# Patient Record
Sex: Female | Born: 1975 | Race: Black or African American | Hispanic: No | Marital: Single | Smoking: Former smoker
Health system: Northeastern US, Academic
[De-identification: ages and names within clinical notes are randomized; demographics above are authoritative.]

## PROBLEM LIST (undated history)

## (undated) MED FILL — HYDROCHLOROTHIAZIDE 25 MG TABLET: 25 25 mg | ORAL | 30 days supply | Qty: 30 | Fill #0 | Status: CN

## (undated) MED FILL — SERTRALINE 25 MG TABLET: 25 25 mg | ORAL | 30 days supply | Qty: 30 | Fill #0 | Status: CN

## (undated) MED FILL — MOUNJARO 5 MG/0.5 ML SUBCUTANEOUS PEN INJECTOR: 5 5 mg/0.5 mL | SUBCUTANEOUS | 28 days supply | Qty: 2 | Fill #0 | Status: CN

## (undated) MED FILL — INSULIN GLARGINE (U-100) 100 UNIT/ML (3 ML) SUBCUTANEOUS PEN: 100 100 unit/mL (3 mL) | SUBCUTANEOUS | 30 days supply | Qty: 15 | Fill #0 | Status: CN

## (undated) MED FILL — ERGOCALCIFEROL (VITAMIN D2) 1,250 MCG (50,000 UNIT) CAPSULE: 1.25 1.25 MG (50000 UT) | ORAL | 28 days supply | Qty: 4 | Fill #0 | Status: CN

## (undated) MED FILL — MOUNJARO 2.5 MG/0.5 ML SUBCUTANEOUS PEN INJECTOR: 2.5 2.5 mg/0.5 mL | SUBCUTANEOUS | 28 days supply | Qty: 2 | Fill #1 | Status: CN

## (undated) MED FILL — OZEMPIC 0.25 MG OR 0.5 MG (2 MG/1.5 ML) SUBCUTANEOUS PEN INJECTOR: 0.25 0.25 mg or 0.5 mg(2 mg/1.5 mL) | SUBCUTANEOUS | 6 days supply | Qty: 1 | Fill #0 | Status: CN

## (undated) MED FILL — ATORVASTATIN 40 MG TABLET: 40 40 mg | ORAL | 30 days supply | Qty: 30 | Fill #0 | Status: CN

---

## 2015-02-16 ENCOUNTER — Ambulatory Visit: Admit: 2015-02-16 | Payer: HMO

## 2015-07-27 ENCOUNTER — Ambulatory Visit: Admit: 2015-07-27 | Payer: HMO

## 2016-02-15 ENCOUNTER — Ambulatory Visit

## 2016-02-15 NOTE — Telephone Encounter (Signed)
----   Converted from flag ----  ---- 02/14/2016 8:30 AM, Coralyn Pear wrote:  [facility]    ---- 02/13/2016 8:35 AM, [name], MD wrote:  please outreach to pt to book appt with me. J  ------------------------------      Created By [name] MD on 02/15/2016 at 12:01 PM    Electronically Signed By [name] MD on 02/15/2016 at 12:01 PM

## 2016-04-06 ENCOUNTER — Ambulatory Visit

## 2016-04-06 NOTE — Telephone Encounter (Signed)
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) (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)  HYPERTENSION (ICD-401.9) (ICD10-I10)  DEPRESSION (ICD-311) (ICD10-F32.9)  ANXIETY (ICD-300.00) (ICD10-F41.9)  ABNORMAL PAP SMEAR 12/11 LSIL; COLPO 12/11; BX CIN I; 6/12 ASCUS PAP; 4/13 PAP NEG; HR HPV POS, NEG COLPO; 5/14 PAP/HR HPV NEG (ICD-795.00) (ICD10-R87.619)  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)  PATELLAR DISLOCATION-MEDIAL RETINACULAR TEAR; RIGHT KNEE (ICD-836.3) (ICD10-S83.006)  OBESITY (ICD-278.00) (ICD10-E66.9)  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)  SEXUALLY TRANSMITTED DISEASE, EXPOSURE TO-H/O GC, TRICH, HPV, CHLAMYDIA (ICD-V01.6) (ICD10-Z20.2)  S/P CHOLECYSTECTOMY (ICD-V45.79) (ICD10-Z90.49)    Allergies:   PENICILLIN V POTASSIUM (PENICILLIN V POTASSIUM) (Critical)    Meds (prior to this call):   LANTUS 100 UNIT/ML SOLN (INSULIN GLARGINE) 47  units daily as directed  LISINOPRIL-HYDROCHLOROTHIAZIDE 20-12.5 MG TABS (LISINOPRIL-HYDROCHLOROTHIAZIDE) one tablet daily  ATORVASTATIN CALCIUM 20 MG TABS (ATORVASTATIN CALCIUM) one tablet orally daily  * ACCUCHECK GLUCOMETER check sugars three times daily Dx: Diabetes type II with retinopathy E11.319  RELION ALL-IN-ONE DEVI (BLOOD GLUC METER DISP-STRIPS) Use three times daily (whichever brand covered/with meter)  LANCETS MISC (LANCETS) Use as directed.  INSULIN SYRINGE 30G X 1/2" 0.5 ML MISC (INSULIN SYRINGE-NEEDLE U-100)   BD DISP NEEDLES 30G X 1/2" MISC (NEEDLE (DISP)) use small needle to inject insulin          Created By [name] MD on 04/06/2016 at  03:20 PM    Electronically Signed By [name] MD on 04/06/2016 at 03:20 PM

## 2016-06-06 ENCOUNTER — Ambulatory Visit

## 2016-06-06 NOTE — Telephone Encounter (Signed)
----   Converted from flag ----  ---- 06/06/2016 10:26 AM, Coralyn Pear wrote:  [facility]    ---- 06/05/2016 8:11 PM, [name], MD wrote:  outreach to pt for appt with me...  Also, is she still having shortness of breath? Need rebook stress echo?  Thanks. J  ------------------------------    Created By [name] MD on 06/06/2016 at 11:04 AM    Electronically Signed By [name] MD on 06/06/2016 at 11:04 AM

## 2016-06-30 ENCOUNTER — Ambulatory Visit

## 2016-06-30 NOTE — Telephone Encounter (Signed)
PHONE NOTE   Phone call placed to patient      Phone Call: Need to make an appointment     Call Information   *Call Reason: Diabetes    Call Action   *Spoke with Patient: LVM (Left Voice Message)     *Outcome of Call: Did not reach patient    Other Call Details     (Scripting)   Hello. I am calling you from Primary Care at [facility]. How are you today?    Your doctor wanted to schedule a follow-up appointment to review your care.    When are you available to come in?    [name]..if asked why you called, or reason for appoinment...    Your Diabetes is out of control. It is important for your health to see your team and decrease your blood sugars.    ......................................Marland KitchenCoralyn Pear  June 30, 2016 4:11 PM        Created By Coralyn Pear on 06/30/2016 at 04:11 PM    Electronically Signed By [name] MD on 07/01/2016 at 08:05 AM

## 2016-07-03 ENCOUNTER — Ambulatory Visit

## 2016-07-03 NOTE — Telephone Encounter (Signed)
PHONE NOTE   Phone call placed to patient      Phone Call: Need to make an appointment     Call Information   *Call Reason: Diabetes    Call Action   *Spoke with Patient: No (no answer; disconnected; bad tel #)     *Outcome of Call: Did not reach patient    Other Call Details   Notes: Patient already has an appointment scheduled for 08/01/16 at 11:00am.    (Scripting)   Hello. I am calling you from Primary Care at [facility]. How are you today?    Your doctor wanted to schedule a follow-up appointment to review your care.    When are you available to come in?    [name]..if asked why you called, or reason for appoinment...    Your Diabetes is out of control. It is important for your health to see your team and decrease your blood sugars.    ......................................Marland KitchenCoralyn Pear  July 03, 2016 3:52 PM        Created By Coralyn Pear on 07/03/2016 at 03:52 PM    Electronically Signed By [name] MD on 07/09/2016 at 08:29 AM

## 2016-07-07 ENCOUNTER — Ambulatory Visit

## 2016-07-21 ENCOUNTER — Ambulatory Visit

## 2016-08-15 ENCOUNTER — Ambulatory Visit

## 2016-08-15 ENCOUNTER — Ambulatory Visit: Admitting: Internal Medicine

## 2016-08-15 LAB — HX HEM-ROUTINE
HX HCT: 35.2 % (ref 32.0–45.0)
HX HGB: 11.7 g/dL (ref 11.0–15.0)
HX MCH: 30.6 pg (ref 26.0–34.0)
HX MCHC: 33.2 g/dL (ref 32.0–36.0)
HX MCV: 92.1 fL (ref 80.0–98.0)
HX MPV: 9.6 fL (ref 9.1–11.7)
HX NRBC #: 0 10*3/uL
HX NUCLEATED RBC: 0 %
HX PLT: 338 10*3/uL (ref 150–400)
HX RBC BLOOD COUNT: 3.82 M/uL (ref 3.70–5.00)
HX RDW: 15.5 % — ABNORMAL HIGH (ref 11.5–14.5)
HX WBC: 8 10*3/uL (ref 4.0–11.0)

## 2016-08-15 LAB — HX BF-CHEM/URINE
HX ALBUMIN RANDOM URINE: <0.5 mg/dL
HX CREATININE, RANDOM URINE: 116.86 mg/dL

## 2016-08-15 LAB — HX DIABETES: HX ALBUMIN RANDOM URINE: <0.5 mg/dL

## 2016-08-15 LAB — HX CHEM-PANELS
HX ANION GAP: 7 (ref 5–18)
HX BLOOD UREA NITROGEN: 15 mg/dL (ref 6–24)
HX CHLORIDE (CL): 106 meq/L (ref 98–110)
HX CO2: 25 meq/L (ref 20–30)
HX CREATININE (CR): 0.97 mg/dL (ref 0.57–1.30)
HX GFR, AFRICAN AMERICAN: 84 mL/min/{1.73_m2} — ABNORMAL LOW
HX GFR, NON-AFRICAN AMERICAN: 73 mL/min/{1.73_m2} — ABNORMAL LOW
HX POTASSIUM (K): 4.3 meq/L (ref 3.6–5.1)
HX SODIUM (NA): 138 meq/L (ref 135–145)

## 2016-08-15 LAB — HX CHEM-LFT
HX ALANINE AMINOTRANSFERASE (ALT/SGPT): 14 IU/L (ref 0–54)
HX ASPARTATE AMINOTRANFERASE (AST/SGOT): 13 IU/L (ref 10–42)

## 2016-08-15 LAB — HX CHEM-LIPIDS
HX CHOL-HDL RATIO: 4.3
HX CHOLESTEROL: 182 mg/dL (ref 110–199)
HX HIGH DENSITY LIPOPROTEIN CHOL (HDL): 42 mg/dL (ref 35–75)
HX HOURS FAST: 0 h
HX LDL: 117 mg/dL (ref 0–129)
HX TRIGLYCERIDES: 114 mg/dL (ref 40–250)

## 2016-08-15 LAB — HX CHOLESTEROL
HX CHOLESTEROL: 182 mg/dL (ref 110–199)
HX HIGH DENSITY LIPOPROTEIN CHOL (HDL): 42 mg/dL (ref 35–75)
HX LDL: 117 mg/dL (ref 0–129)
HX TRIGLYCERIDES: 114 mg/dL (ref 40–250)

## 2016-08-15 LAB — HX POINT OF CARE: HX HGB A1C, POC: 9.6 % — ABNORMAL HIGH (ref 4.3–5.8)

## 2016-08-15 NOTE — Progress Notes (Signed)
General Medicine Visit  .  Service Due by Standard Protocol Rules: DIAB EYE EX, MICROALB/CRE, LDL, HGB  A1C or HGBA1C%POC.    .  Initial Screening   * [place]: Sheikh ([name])  Ht: 64.5 in.  Wt: 211.8 lbs.   BMI: 35.92  Temp: 100.8 deg F.     BP (Initial [place] Screening): 124 / 78      BP (Rechecked, Actionable): 124 /   78 mmHg   HR: 110     Travel outside of the [place] in past 28 days:: No  Chronic Pain Assessment: Does pt experience chronic pain ? NO  Smoking Assessment: Tobacco use? current every day smoker  Smoking Counseling? YES Quitworks form was provided today  .  [name]  Med List: PRINTED by [place] for patient   Med List: PRINTED by [place] for patient   Patient consents for eRx History:  Paper  .  Falls Risk Assessment:   In the past year, have you ... Had no falls  Difficulty with balance? NO  Need assistance with ambulation while here? NO  Comments: Medication List given.  .  ......................................Marland KitchenNafisa Sheikh  August 15, 2016 1:41   PM  .  Basic Visit  Chief Complaint:   40 year old here for f/u mmp   .  History of Present Illness:   DOE   Will at times get hard to catch her breath when moving about in the house.   Maybe bending down/picking something up. That has been a while, not progre  ssing. : Last time was short of breath and didn't do stress test    "I was scared of that" I can't go on a treadmill   Still a bit stuffy; can be short of breath with exertion   "I don't think it is worse" about the same   exercise--jumping jacks here and there; squats, weights; feels that she do  es that ok   Walks-a few days, will get lunch and come back; 2 blocks;   .  DM: due for diabetic eye exam-wants to do at [facility].    Was off insulin for a few months due to finances   Now back on August or so.   Has not been checking any sugars.   no lows   currently taking 45 unitts although was supposed to be on 47 units   also hard to check sugars b/c materials are expensive  Is noticing health things "my eyes and breathing";  body feels different; sh  e was feeling heavy, like she was gaining weight; These would be reasons to   stay on track and have dm in control;    Stubborn, selfish, "my attitude" is why she falls off the wagon and doesn't   pay attention to it;  Brother and his baby mama drama. Had to get her brot  her out of jail. so she was stressed out.  wasn't ready to hear she needed   stress echo.  So she puts it off. Procrastination.  Is very angry and react  ive with her boyfriend.  no Si/HI. Eats, sleeps ok.   .  .  .  HTN: good adherence   .  Contraception-went to OB GYN at [facility] but was late to that visit but did n  ot get IUD. She is thinking about IUD and she will make an appt. 90% condom  s. With Ex. Ex wants to her to get pregnant, she is unsure. She would like  to be healthier.   .  .  .  .  .  .  .  Social History:   [place]; dad lived with them off and on; lived with mom, br  other;there was a time dad was cheating;   .  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  Health Care Proxy: Mom  Tobacco:occasionally smoke; weekends   Alcohol: 3/week  Drugs:MJ, few times/week  .  Diet:diabetic "I need to lose weight"; 24 hour recall: This AM-english muff  in, bacon; water; dinner last night-pizza- 1/2 of djorno; water; banana pud  ding; Lunch-Chinese food leftovers  Exercise: could do more; now and then  Car safety-seatbelts/texting:discussed  .  Partner: has  boyfriend now; using condoms  DV:no  STI history:    gc, chlamydia, hpv, herpes                    HIV testing:2014 tested  Contraception: menses now; (IUD out)  Abnormal pap history: 9/10 ascus; colpo neg; 12/11 colpo lsil; 6/12 ascus,   hpv not done; 4/13 pap neg, hr hpv pos, colpo neg; 5/14 pap/hpv neg;  pap M  ay 2015-neg pap, did not receive hpv results  .  Last CE: 5/15; 1/17  Pap: 5/15 pap neg (HPV sent but I didn't get result)  Mammo:ni; 8/17 on mammo Zenaida Niece --bring in record    Colo:ni  BMD:ni  Eye care:10/15;9/16, wants 6 month f/u   Dental: utd  Choleseterol:11/15 LDL over 100, rec'd statin  Risk Score Cholesterol: on high intensity statin  Tdap: 2012  MMR: vax 1978, 1994  Varicella:kid  Hep B:3 vax  Pvax:2011  Flu 2015, 2016  Zoster:ni  HPV:ni  .  .  .  lost 10 lbs since Jan  no chest pain per se  .  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  [name]  Medications (prior to today's visit):  LANTUS 100 UNIT/ML SOLN (INSULIN GLARGINE) 47  units daily as directed  LISINOPRIL-HYDROCHLOROTHIAZIDE 20-12.5 MG TABS (LISINOPRIL-HYDROCHLOROTHIAZ  IDE) one tablet daily  ATORVASTATIN CALCIUM 20 MG TABS (ATORVASTATIN CALCIUM) one tablet orally da  ily  * ACCUCHECK GLUCOMETER check sugars three times daily Dx: Diabetes type II   with retinopathy E11.319  RELION ALL-IN-ONE DEVI (BLOOD GLUC METER DISP-STRIPS) Use three times daily   (whichever brand covered/with meter)  LANCETS MISC (LANCETS) Use as directed.  INSULIN SYRINGE 30G X 1/2" 0.5 ML MISC (INSULIN SYRINGE-NEEDLE U-100)   BD DISP NEEDLES 30G X 1/2" MISC (NEEDLE (DISP)) use small needle to inject   insulin  .  Medications (after today's visit):  LANTUS  100 UNIT/ML SOLN (INSULIN GLARGINE) 47  units daily as directed  LISINOPRIL-HYDROCHLOROTHIAZIDE 20-12.5 MG TABS (LISINOPRIL-HYDROCHLOROTHIAZ  IDE) one tablet daily  ATORVASTATIN CALCIUM 20  MG TABS (ATORVASTATIN CALCIUM) one tablet orally da  ily  * ACCUCHECK GLUCOMETER check sugars three times daily Dx: Diabetes type II   with retinopathy E11.319  RELION ALL-IN-ONE DEVI (BLOOD GLUC METER DISP-STRIPS) Use three times daily   (whichever brand covered/with meter)  LANCETS MISC (LANCETS) Use as directed.  INSULIN SYRINGE 30G X 1/2" 0.5 ML MISC (INSULIN SYRINGE-NEEDLE U-100)   BD DISP NEEDLES 30G X 1/2" MISC (NEEDLE (DISP)) use small needle to inject   insulin  SERTRALINE HCL 50 MG TABS (SERTRALINE HCL) Take one tablet by mouth once da  ily  .  [name]  Allergies (prior to today's visit):  PENICILLIN V POTASSIUM (PENICILLIN V POTASSIUM) (Critical)  .  .  .  .  Vitals:   Ht: 64.5 in.  Wt: 211.8 lbs.  BMI (in-lb) 35.92  Temp: 100.8deg F.     BP (Initial [place] Screening): 124 / 78     BP (Rechecked, Actionable): 124 / 7  8 mmHg   Pulse Rate: 110 bpm  .  .  Additional PE:   General: Well appearing  Head: NCAT  Gait: normal  Eyes-anicteric  Neck: supple  Psych: affect and mood appropriate  Still has long fingernails  just had coffee (temp is up)   .  .  .  Assessment /T/ Plan:   40 year old diabetic, htn and depression here for f/u mmp   .  Spent over 1/2 of 50 min visit in discussing denial and attitude around dia  betes, stopping insulin for 3 months, response to stress ;Deals with stress   by isolating. Would do really well with a Haematologist. She is ambivalent. A  grees to Zoloft to help her with reactivity and irritability .   .  SOB: ddx includes CHF, CAD, lung disease (clear lungs on exam) post nasal d  rip and GERD; Would recommend flonase otc for nose congestion and prilosec/  zantac and lifestyle for GERD.  Will  check stress echo and discussed impli  cations/to ER with prolonged episode. on statin. Will not add aspirin yet  g  iven GERD sxs as well. unable to check sat today due to fingernails-asked h  er to come without next time (if possible) . Sx stable but not gone. Reorde  r. She understands importance of this.   .  . DM;  off insulin until August, now back on and not checking sugars; finan  cials are an issue    needs to get back on to checking sugars     no lows, increase lantus to 47 units      eye exam 9/16; mild DR, needs f/u --book now       changed to atorvastatin 20 mg; check LDL today   .  .  2. bp controlled  .  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 Monitoring wit  h eye care,  book now   .  4. depression -now not meeting criteria; has a lot of denial around medical   care, easily overwhelmed and then stops coming; trying to address and  hel  p her face issues in a timely way   .  Contraception-condoms; she would like to go to [facility] and discuss IU  D again (had prior)   .  Likely PCOS (advised weight loss)  Likely hidradenitis-no active boils now; use antibacterial and soaks; consi  der cleocin gel if continued  prevnar to give next time   last pap  5/15 (got result but not HPV results) She will [facility] next is due  f/u in one month for these multiple issues  .  [name]  Orders (this visit):  Other Ophthalmology [Ref-Eye]  Albumin, Random Urine  [CPT-82043]  LYTES [CPT-82495]  BUN [Q30940E,L001040]  Creatinine (CR) [CPT-82565]  Lipid Profile-Chol, HDL, Trig, LDL(calc) [CPT-80061]  CBC  [CPT-85027]  SGOT (AST) [Q19208W,L001123]  SGPT (ALT) [Q17426R,L001545]  Stress Echo [CPT-93015]  RTC in 4 weeks [RTC-028]  Vaccine Influenza (8479) [CPT-90658]  Injection Influenza Admin (8274) [CPT-90471]  Est Level 5 (1130/NP2306) [CPT-99215]  .  Patient Care Plan  .  [name]  Immunization Worksheet 2016   .  Influenza VIS VIS handout, Flu 06/12/2014  .  .  .  Electronically Signed by [name], MD on 08/15/2016 at 2:39  PM  ________________________________________________________________________  Division of General Medicine - Additional Orders  .  [name]  Orders   .  BILLING ERROR [CPT-00000]  .  .  .  Electronically Signed by [name], MD on 08/23/2016 at 11:38 AM  ________________________________________________________________________

## 2016-08-15 NOTE — Progress Notes (Signed)
General Medicine Visit  .  Service Due by Standard Protocol Rules: DIAB EYE EX, MICROALB/CRE, LDL, HGB  A1C or HGBA1C%POC.    .  Initial Screening   * [place]: Sheikh ([name])  Ht: 64.5 in.  Wt: 211.8 lbs.   BMI: 35.92  Temp: 100.8 deg F.     BP (Initial [place] Screening): 124 / 78      BP (Rechecked, Actionable): 124 /   78 mmHg   HR: 110     Travel outside of the [place] in past 28 days:: No  Chronic Pain Assessment: Does pt experience chronic pain ? NO  Smoking Assessment: Tobacco use? current every day smoker  Smoking Counseling? YES Quitworks form was provided today  .  [name]  Med List: PRINTED by [facility] for patient   Med List: PRINTED by [place] for patient   Patient consents for eRx History:  Paper  .  Falls Risk Assessment:   In the past year, have you ... Had no falls  Difficulty with balance? NO  Need assistance with ambulation while here? NO  Comments: Medication List given.  .  ......................................Marland KitchenNafisa Sheikh  August 15, 2016 1:41   PM  .  Basic Visit  Chief Complaint:   40 year old here for f/u mmp   .  History of Present Illness:   DOE   Will at times get hard to catch her breath when moving about in the house.   Maybe bending down/picking something up. That has been a while, not progre  ssing. : Last time was short of breath and didn't do stress test    "I was scared of that" I can't go on a treadmill   Still a bit stuffy; can be short of breath with exertion   "I don't think it is worse" about the same   exercise--jumping jacks here and there; squats, weights; feels that she do  es that ok   Walks-a few days, will get lunch and come back; 2 blocks;   .  DM: due for diabetic eye exam-wants to do at [facility].    Was off insulin for a few months due to finances   Now back on August or so.   Has not been checking any sugars.   no lows   currently taking 45 unitts although was supposed to be on 47 units   also hard to check sugars b/c materials are expensive  Is noticing health things "my eyes and breathing";  body feels different; sh  e was feeling heavy, like she was gaining weight; These would be reasons to   stay on track and have dm in control;    Stubborn, selfish, "my attitude" is why she falls off the wagon and doesn't   pay attention to it;  Brother and his baby mama drama. Had to get her brot  her out of jail. so she was stressed out.  wasn't ready to hear she needed   stress echo.  So she puts it off. Procrastination.  Is very angry and react  ive with her boyfriend.  no Si/HI. Eats, sleeps ok.   .  .  .  HTN: good adherence   .  Contraception-went to OB GYN at [facility] but was late to that visit but did n  ot get IUD. She is thinking about IUD and she will make an appt. 90% condom  s. With Ex. Ex wants to her to get pregnant, she is unsure. She would like  to be healthier.   .  .  .  .  .  .  .  Social History:   [place]; dad lived with them off and on; lived with mom, br  other;there was a time dad was cheating;   .  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  Health Care Proxy: Mom  Tobacco:occasionally smoke; weekends   Alcohol: 3/week  Drugs:MJ, few times/week  .  Diet:diabetic "I need to lose weight"; 24 hour recall: This AM-english muff  in, bacon; water; dinner last night-pizza- 1/2 of djorno; water; banana pud  ding; Lunch-Chinese food leftovers  Exercise: could do more; now and then  Car safety-seatbelts/texting:discussed  .  Partner: has  boyfriend now; using condoms  DV:no  STI history:    gc, chlamydia, hpv, herpes                    HIV testing:2014 tested  Contraception: menses now; (IUD out)  Abnormal pap history: 9/10 ascus; colpo neg; 12/11 colpo lsil; 6/12 ascus,   hpv not done; 4/13 pap neg, hr hpv pos, colpo neg; 5/14 pap/hpv neg;  pap M  ay 2015-neg pap, did not receive hpv results  .  Last CE: 5/15; 1/17  Pap: 5/15 pap neg (HPV sent but I didn't get result)  Mammo:ni; 8/17 on mammo Zenaida Niece --bring in record    Colo:ni  BMD:ni  Eye care:10/15;9/16, wants 6 month f/u   Dental: utd  Choleseterol:11/15 LDL over 100, rec'd statin  Risk Score Cholesterol: on high intensity statin  Tdap: 2012  MMR: vax 1978, 1994  Varicella:kid  Hep B:3 vax  Pvax:2011  Flu 2015, 2016  Zoster:ni  HPV:ni  .  .  .  lost 10 lbs since Jan  no chest pain per se  .  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  [name]  Medications (prior to today's visit):  LANTUS 100 UNIT/ML SOLN (INSULIN GLARGINE) 47  units daily as directed  LISINOPRIL-HYDROCHLOROTHIAZIDE 20-12.5 MG TABS (LISINOPRIL-HYDROCHLOROTHIAZ  IDE) one tablet daily  ATORVASTATIN CALCIUM 20 MG TABS (ATORVASTATIN CALCIUM) one tablet orally da  ily  * ACCUCHECK GLUCOMETER check sugars three times daily Dx: Diabetes type II   with retinopathy E11.319  RELION ALL-IN-ONE DEVI (BLOOD GLUC METER DISP-STRIPS) Use three times daily   (whichever brand covered/with meter)  LANCETS MISC (LANCETS) Use as directed.  INSULIN SYRINGE 30G X 1/2" 0.5 ML MISC (INSULIN SYRINGE-NEEDLE U-100)   BD DISP NEEDLES 30G X 1/2" MISC (NEEDLE (DISP)) use small needle to inject   insulin  .  Medications (after today's visit):  LANTUS  100 UNIT/ML SOLN (INSULIN GLARGINE) 47  units daily as directed  LISINOPRIL-HYDROCHLOROTHIAZIDE 20-12.5 MG TABS (LISINOPRIL-HYDROCHLOROTHIAZ  IDE) one tablet daily  ATORVASTATIN CALCIUM 20  MG TABS (ATORVASTATIN CALCIUM) one tablet orally da  ily  * ACCUCHECK GLUCOMETER check sugars three times daily Dx: Diabetes type II   with retinopathy E11.319  RELION ALL-IN-ONE DEVI (BLOOD GLUC METER DISP-STRIPS) Use three times daily   (whichever brand covered/with meter)  LANCETS MISC (LANCETS) Use as directed.  INSULIN SYRINGE 30G X 1/2" 0.5 ML MISC (INSULIN SYRINGE-NEEDLE U-100)   BD DISP NEEDLES 30G X 1/2" MISC (NEEDLE (DISP)) use small needle to inject   insulin  SERTRALINE HCL 50 MG TABS (SERTRALINE HCL) Take one tablet by mouth once da  ily  .  [name]  Allergies (prior to today's visit):  PENICILLIN V POTASSIUM (PENICILLIN V POTASSIUM) (Critical)  .  .  .  .  Vitals:   Ht: 64.5 in.  Wt: 211.8 lbs.  BMI (in-lb) 35.92  Temp: 100.8deg F.     BP (Initial [place] Screening): 124 / 78     BP (Rechecked, Actionable): 124 / 7  8 mmHg   Pulse Rate: 110 bpm  .  .  Additional PE:   General: Well appearing  Head: NCAT  Gait: normal  Eyes-anicteric  Neck: supple  Psych: affect and mood appropriate  Still has long fingernails  just had coffee (temp is up)   .  .  .  Assessment /T/ Plan:   40 year old diabetic, htn and depression here for f/u mmp   .  Spent over 1/2 of 50 min visit in discussing denial and attitude around dia  betes, stopping insulin for 3 months, response to stress ;Deals with stress   by isolating. Would do really well with a Haematologist. She is ambivalent. A  grees to Zoloft to help her with reactivity and irritability .   .  SOB: ddx includes CHF, CAD, lung disease (clear lungs on exam) post nasal d  rip and GERD; Would recommend flonase otc for nose congestion and prilosec/  zantac and lifestyle for GERD.  Will  check stress echo and discussed impli  cations/to ER with prolonged episode. on statin. Will not add aspirin yet  g  iven GERD sxs as well. unable to check sat today due to fingernails-asked h  er to come without next time (if possible) . Sx stable but not gone. Reorde  r. She understands importance of this.   .  . DM;  off insulin until August, now back on and not checking sugars; finan  cials are an issue    needs to get back on to checking sugars     no lows, increase lantus to 47 units      eye exam 9/16; mild DR, needs f/u --book now       changed to atorvastatin 20 mg; check LDL today   .  .  2. bp controlled  .  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 Monitoring wit  h eye care,  book now   .  4. depression -now not meeting criteria; has a lot of denial around medical   care, easily overwhelmed and then stops coming; trying to address and  hel  p her face issues in a timely way   .  Contraception-condoms; she would like to go to [facility] and discuss IU  D again (had prior)   .  Likely PCOS (advised weight loss)  Likely hidradenitis-no active boils now; use antibacterial and soaks; consi  der cleocin gel if continued  prevnar to give next time   last pap  5/15 (got result but not HPV results) She will [facility] when next is due  f/u in one month for these multiple issues  .  [name]  Orders (this visit):  Other Ophthalmology [Ref-Eye]  Albumin, Random Urine  [CPT-82043]  LYTES [CPT-82495]  BUN [Q30940E,L001040]  Creatinine (CR) [CPT-82565]  Lipid Profile-Chol, HDL, Trig, LDL(calc) [CPT-80061]  CBC  [CPT-85027]  SGOT (AST) [Q19208W,L001123]  SGPT (ALT) [Q17426R,L001545]  Stress Echo [CPT-93015]  RTC in 4 weeks [RTC-028]  Vaccine Influenza (8479) [CPT-90658]  Injection Influenza Admin (8274) [CPT-90471]  Est Level 5 (1130/NP2306) [CPT-99215]  .  Patient Care Plan  .  [name]  Immunization Worksheet 2016   .  Influenza VIS VIS handout, Flu 06/12/2014  .  .  .  Electronically Signed by [name], MD on 08/15/2016 at 2:39  PM  ________________________________________________________________________

## 2016-08-17 ENCOUNTER — Ambulatory Visit

## 2016-08-17 NOTE — Progress Notes (Signed)
[facility] August 17, 2016  [address]  [place] [address]  Main: [phone]  Fax:   Patient Portal: [name]  [address]  [place], [place]  [address]          MR#: [id number]          DOB: 03/19/1976    Dear  [name],    The results of the tests performed during your visit are as follows:  Cholesterol Tests   Cholesterol 182 Normal = 110-199 08/15/2016   HDL (good cholesterol) 42 Normal > 40 08/15/2016   Triglyceride 114 Normal = 40-250 08/15/2016   LDL (bad cholesterol) 117 Normal < 190  (If you are on a cholesterol medicine, the goal is to lower this by at least 50% from your baseline) 08/15/2016   Your risk of heart attack or stroke in the next 10 years is % If > 7.5%, then consider starting a type of medicine called "statin"     Calculate your own risk here:  http://tools.BuyDucts.dk   For more information, visit   http://tools.TransportationAnalyst.gl    Diabetes Tests   HgbA1c 9.6 Normal = 4.3-5.8 08/15/2016     Blood Count Tests   Hematocrit 35.2 Normal = 32-45 (Women)  Normal = 37-47 (Men) 08/15/2016   White Blood Cells 8.0 Normal = 4-11 08/15/2016   Platelets 338 Normal = 150-400 08/15/2016     Liver Tests   ALT 14 Normal = 0-54 08/15/2016   AST 13 Normal = 10-42 08/15/2016     Kidney Tests   BUN 15 Normal = 6-24 08/15/2016   Creatinine 0.97 Normal = 0.57-1.30 08/15/2016   Potassium 4.3 MEQ/L Normal = 3.6-5.1 08/15/2016   GFR (Non African-American) 73 Normal > 60 08/15/2016   GFR (African-American) 84 Normal > 60 08/15/2016     Other Tests   Micro Albumin Urine negative  Normal =  08/15/2016     I really hope to see you next month--you didn't book a follow up appointment. Please call at your earliest convenience to schedule. I think we can get you feeling better and your diabetes in better control.     Sincerely,       [name], MD  [facility]      ** Please  be aware of our new extended hours to better care for you. Hours are: Monday through Thursday from 8 am to 8 pm; Friday from 8 am to 5 pm; Saturday from 8 am to 12 noon.        Created By Jerilee Hoh on 08/17/2016 at 04:15 PM    Electronically Signed By [name] MD on 08/17/2016 at 05:15 PM

## 2016-08-18 ENCOUNTER — Ambulatory Visit

## 2016-08-18 NOTE — Telephone Encounter (Signed)
----   Converted from Care Alert ----  ---- 08/18/2016 10:00 AM, Coralyn Pear wrote:  I just called patient to help schedule f/u. It went right to VM but I left a messgae for her to call back.  ------------------------------      Created By [name] MD on 08/18/2016 at 12:44 PM    Electronically Signed By [name] MD on 08/18/2016 at 12:44 PM

## 2016-08-23 ENCOUNTER — Ambulatory Visit

## 2016-08-29 ENCOUNTER — Ambulatory Visit

## 2016-08-29 NOTE — Telephone Encounter (Signed)
PHONE NOTE     CALL INFORMATION:   Reason for call: Other  Person Calling: Neeta Storey  Relationship to pt: Pt  PCP: Dr.Tishler  Time/Date of call: 08/29/2016  Day Phone #: [phone]      CALL DETAILS:   Patient is requesting to speak with RN Clerance Lav regarding schedule appointment. Patient contact number is  [phone].    Thank you    Call Taken by: ......................................Marland KitchenVirgina Evener  August 29, 2016 11:10 AM          RESPONSE/ORDERS:    called pt back, LM that upcoming appoint is with PCP 09/26/2016 and I have never seen the pt so unsure if I am the nurse she wishes to speak with.  I LM that I would send the message to RN desktop 6B to try to reach her later as well and that she should reach out if any concerns.  .......................................Haynes Kerns, MS,NP  August 29, 2016 11:30 AM  appt nov 21 cancelled ? 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) (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)  HYPERTENSION (ICD-401.9) (ICD10-I10)  DEPRESSION (ICD-311) (ICD10-F32.9)  ANXIETY (ICD-300.00) (ICD10-F41.9)  ABNORMAL PAP SMEAR 12/11 LSIL; COLPO 12/11; BX CIN I; 6/12 ASCUS PAP; 4/13 PAP NEG; HR HPV POS, NEG COLPO; 5/14 PAP/HR HPV NEG (ICD-795.00) (ICD10-R87.619)  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)  PATELLAR DISLOCATION-MEDIAL RETINACULAR TEAR; RIGHT KNEE (ICD-836.3) (ICD10-S83.006)  OBESITY (ICD-278.00) (ICD10-E66.9)  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)  SEXUALLY TRANSMITTED DISEASE, EXPOSURE TO-H/O GC, TRICH, HPV, CHLAMYDIA (ICD-V01.6) (ICD10-Z20.2)  S/P CHOLECYSTECTOMY (ICD-V45.79)  (ICD10-Z90.49)    Allergies:   PENICILLIN V POTASSIUM (PENICILLIN V POTASSIUM) (Critical)    Meds (prior to this call):   LANTUS 100 UNIT/ML SOLN (INSULIN GLARGINE) 47  units daily as directed  LISINOPRIL-HYDROCHLOROTHIAZIDE 20-12.5 MG TABS (LISINOPRIL-HYDROCHLOROTHIAZIDE) one tablet daily  ATORVASTATIN CALCIUM 20 MG TABS (ATORVASTATIN CALCIUM) one tablet orally daily  * ACCUCHECK GLUCOMETER check sugars three times daily Dx: Diabetes type II with retinopathy E11.319  RELION ALL-IN-ONE DEVI (BLOOD GLUC METER DISP-STRIPS) Use three times daily (whichever brand covered/with meter)  LANCETS MISC (LANCETS) Use as directed.  INSULIN SYRINGE 30G X 1/2" 0.5 ML MISC (INSULIN SYRINGE-NEEDLE U-100)   BD DISP NEEDLES 30G X 1/2" MISC (NEEDLE (DISP)) use small needle to inject insulin  SERTRALINE HCL 50 MG TABS (SERTRALINE HCL) Take one tablet by mouth once daily    Changes to Meds (this update):   Changed medication from ATORVASTATIN CALCIUM 20 MG TABS (ATORVASTATIN CALCIUM) one tablet orally daily to ATORVASTATIN CALCIUM 20 MG TABS (ATORVASTATIN CALCIUM) one tablet orally daily - Signed  Changed medication from LANTUS 100 UNIT/ML SOLN (INSULIN GLARGINE) 47  units daily as directed to LANTUS 100 UNIT/ML SOLN (INSULIN GLARGINE) 47  units daily as directed - Signed        Created By Virgina Evener on 08/29/2016 at 11:07 AM    Electronically Signed By London Sheer MS,NP on 08/29/2016 at 03:36 PM

## 2016-08-31 ENCOUNTER — Ambulatory Visit

## 2016-08-31 NOTE — Telephone Encounter (Signed)
PHONE NOTE     CALL INFORMATION:   Reason for call: Other      CALL DETAILS:   does she have stress echo ordered?  f/u with me?  looks like she called looking for something but got to ?wrong person  can you call her? 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) (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)  HYPERTENSION (ICD-401.9) (ICD10-I10)  DEPRESSION (ICD-311) (ICD10-F32.9)  ANXIETY (ICD-300.00) (ICD10-F41.9)  ABNORMAL PAP SMEAR 12/11 LSIL; COLPO 12/11; BX CIN I; 6/12 ASCUS PAP; 4/13 PAP NEG; HR HPV POS, NEG COLPO; 5/14 PAP/HR HPV NEG (ICD-795.00) (ICD10-R87.619)  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)  PATELLAR DISLOCATION-MEDIAL RETINACULAR TEAR; RIGHT KNEE (ICD-836.3) (ICD10-S83.006)  OBESITY (ICD-278.00) (ICD10-E66.9)  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)  SEXUALLY TRANSMITTED DISEASE, EXPOSURE TO-H/O GC, TRICH, HPV, CHLAMYDIA (ICD-V01.6) (ICD10-Z20.2)  S/P CHOLECYSTECTOMY (ICD-V45.79) (ICD10-Z90.49)    Allergies:   PENICILLIN V POTASSIUM (PENICILLIN V POTASSIUM) (Critical)    Meds (prior to this call):   LANTUS 100 UNIT/ML SOLN (INSULIN GLARGINE) 47  units daily as directed  LISINOPRIL-HYDROCHLOROTHIAZIDE 20-12.5 MG TABS (LISINOPRIL-HYDROCHLOROTHIAZIDE) one tablet daily  ATORVASTATIN CALCIUM 20 MG TABS (ATORVASTATIN CALCIUM) one tablet orally daily  * ACCUCHECK GLUCOMETER check sugars three times daily Dx: Diabetes type II with retinopathy E11.319  RELION ALL-IN-ONE DEVI (BLOOD GLUC METER DISP-STRIPS) Use three times daily (whichever brand covered/with meter)  LANCETS MISC  (LANCETS) Use as directed.  INSULIN SYRINGE 30G X 1/2" 0.5 ML MISC (INSULIN SYRINGE-NEEDLE U-100)   BD DISP NEEDLES 30G X 1/2" MISC (NEEDLE (DISP)) use small needle to inject insulin  SERTRALINE HCL 50 MG TABS (SERTRALINE HCL) Take one tablet by mouth once daily          Created By [name] MD on 08/31/2016 at 05:06 PM    Electronically Signed By [name] MD on 09/01/2016 at 04:15 PM

## 2016-10-25 ENCOUNTER — Ambulatory Visit

## 2016-10-25 NOTE — Telephone Encounter (Signed)
RESPONSE/ORDERS:    reached out to patient to reschedule her missed appt message left and asked her to call and reschedule with PCP   ......................................Marland KitchenDois Davenport, RN  October 25, 2016 3:40 PM    can we reschedule her appt with pcp thanks   ......................................Marland KitchenDois Davenport, RN  October 25, 2016 3:41 PM    Patient rescheduled her follow-up for 11/09/2016 ......................................Marland KitchenCoralyn Pear  October 26, 2016 1:43 PM           ORDERS/PROBS/MEDS/ALL     Problems:   SHORTNESS OF BREATH (ICD-786.05) (ICD10-R06.02)  ANNUAL EXAM (ICD-V72.31) (ICD10-Z00.00)  DIABETES MELLITUS, TYPE II, 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) (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)  HYPERTENSION (ICD-401.9) (ICD10-I10)  DEPRESSION (ICD-311) (ICD10-F32.9)  ANXIETY (ICD-300.00) (ICD10-F41.9)  ABNORMAL PAP SMEAR 12/11 LSIL; COLPO 12/11; BX CIN I; 6/12 ASCUS PAP; 4/13 PAP NEG; HR HPV POS, NEG COLPO; 5/14 PAP/HR HPV NEG (ICD-795.00) (ICD10-R87.619)  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)  PATELLAR DISLOCATION-MEDIAL RETINACULAR TEAR; RIGHT KNEE (ICD-836.3) (ICD10-S83.006)  OBESITY (ICD-278.00) (ICD10-E66.9)  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)  SEXUALLY TRANSMITTED DISEASE, EXPOSURE TO-H/O GC, TRICH, HPV, CHLAMYDIA (ICD-V01.6) (ICD10-Z20.2)  S/P CHOLECYSTECTOMY (ICD-V45.79) (ICD10-Z90.49)    Allergies:   PENICILLIN V POTASSIUM (PENICILLIN V POTASSIUM) (Critical)    Meds (prior to this call):   LANTUS 100 UNIT/ML SUBCUTANEOUS SOLUTION (INSULIN GLARGINE) 47  units daily as directed  LISINOPRIL-HYDROCHLOROTHIAZIDE 20-12.5 MG ORAL TABLET (LISINOPRIL-HYDROCHLOROTHIAZIDE) one tablet daily  ATORVASTATIN CALCIUM 20 MG ORAL TABLET (ATORVASTATIN CALCIUM) one tablet orally daily  * ACCUCHECK GLUCOMETER check sugars three times daily Dx: Diabetes type II with retinopathy E11.319  RELION ALL-IN-ONE DEVICE (BLOOD GLUC METER DISP-STRIPS) Use three times daily (whichever brand covered/with meter)  LANCETS (LANCETS) Use  as directed.  INSULIN SYRINGE 30G X 1/2" 0.5 ML (INSULIN SYRINGE-NEEDLE U-100)   BD DISP NEEDLES 30G X 1/2" (NEEDLE (DISP)) use small needle to inject insulin  SERTRALINE HCL 50 MG ORAL TABLET (SERTRALINE HCL) Take one tablet by mouth once daily          Created By Robley Fries, RN on 10/25/2016 at 03:40 PM    Electronically Signed By Robley Fries, RN on 10/26/2016 at 01:55 PM

## 2016-10-26 ENCOUNTER — Ambulatory Visit

## 2016-10-26 NOTE — Telephone Encounter (Signed)
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)  HYPERTENSION (ICD-401.9) (ICD10-I10)  DEPRESSION (ICD-311) (ICD10-F32.9)  ANXIETY (ICD-300.00) (ICD10-F41.9)  ABNORMAL PAP SMEAR 12/11 LSIL; COLPO 12/11; BX CIN I; 6/12 ASCUS PAP; 4/13 PAP NEG; HR HPV POS, NEG COLPO; 5/14 PAP/HR HPV NEG (ICD-795.00) (ICD10-R87.619)  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)  PATELLAR DISLOCATION-MEDIAL RETINACULAR TEAR; RIGHT KNEE (ICD-836.3) (ICD10-S83.006)  OBESITY (ICD-278.00) (ICD10-E66.9)  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)  SEXUALLY TRANSMITTED DISEASE, EXPOSURE TO-H/O GC, TRICH, HPV, CHLAMYDIA (ICD-V01.6) (ICD10-Z20.2)  S/P CHOLECYSTECTOMY (ICD-V45.79) (ICD10-Z90.49)    Allergies:   PENICILLIN V POTASSIUM (PENICILLIN V POTASSIUM) (Critical)    Meds (prior to this call):   LANTUS 100 UNIT/ML SUBCUTANEOUS SOLUTION (INSULIN GLARGINE) 47  units daily as directed  LISINOPRIL-HYDROCHLOROTHIAZIDE 20-12.5 MG ORAL TABLET (LISINOPRIL-HYDROCHLOROTHIAZIDE) one tablet daily  ATORVASTATIN CALCIUM 20 MG ORAL TABLET (ATORVASTATIN CALCIUM) one tablet orally daily  * ACCUCHECK GLUCOMETER check sugars three times daily Dx: Diabetes type II with retinopathy E11.319  RELION ALL-IN-ONE DEVICE (BLOOD GLUC METER DISP-STRIPS) Use three times daily (whichever brand covered/with meter)  LANCETS (LANCETS) Use as directed.  INSULIN SYRINGE 30G X 1/2" 0.5 ML (INSULIN SYRINGE-NEEDLE U-100)   BD DISP NEEDLES 30G X 1/2" (NEEDLE (DISP)) use small needle to inject insulin  SERTRALINE HCL 50 MG ORAL TABLET (SERTRALINE HCL) Take one tablet by mouth once daily          Created By [name] MD on 10/26/2016 at 11:28 AM    Electronically Signed  By [name] MD on 10/26/2016 at 11:28 AM

## 2016-12-11 ENCOUNTER — Ambulatory Visit

## 2016-12-11 ENCOUNTER — Ambulatory Visit: Admitting: Internal Medicine

## 2016-12-11 ENCOUNTER — Ambulatory Visit: Admit: 2016-12-11 | Payer: HMO

## 2016-12-11 LAB — HX POINT OF CARE: HX HGB A1C, POC: 8.6 % — ABNORMAL HIGH (ref 4.3–5.8)

## 2016-12-11 NOTE — Progress Notes (Signed)
General Medicine Visit  .  Service Due by Standard Protocol Rules: DIAB EYE EX, HGBA1C.    .  Initial Screening   * [place]: Perito (Tina)  Ht: 64.5 in.  Wt: 224.6 lbs.   BMI: 38.09  Temp: 98.7 deg F.     BP (Initial [place] Screening): 129 / 75      BP (Rechecked, Actionable): 129 /   75 mmHg   HR: 78    O2 Sat: 99 %  Travel outside of the [place] in past 28 days:: No  Chronic Pain Assessment: Does pt experience chronic pain ? NO  Smoking Assessment: Tobacco use? current every day smoker  Smoking Counseling? YES Quitworks form was provided today  .  .  .  Med List: PRINTED by [place] for patient   Med List: PRINTED by [place] for patient   Patient consents for eRx History:  Paper  .  Falls Risk Assessment:   In the past year, have you ... Had no falls  Difficulty with balance? NO  Need assistance with ambulation while here? NO  Behavioral Health Tools:   PHQ2 Screening Score 1  .  PHQ9 Score 2  Comments: ......................................Marland KitchenCyndia Skeeters  December 11, 2016 12:09 PM  .  .  ......................................Marland KitchenCyndia Skeeters  December 11, 2016 12:09   PM  .  .  Basic Visit  Chief Complaint:   41 year old here with cough and f/u mmp   .  History of Present Illness:   Cough   started one week ago, getting worse mostly b/c feels sore in throat/ alot   of mucous   pain in left chest with coughing, not with deep breath   has shortness of breath--chronic;  not worse   "a little cold" that comes up    not more now, is very stuffy  .   voice is hoarse   no headache now   did get flu shot this year   no earache   hasn't taken temp, doesn't think fevers   .  DM-did not go to eye Dr  hours are 8 am to 5 pm; job is in [place]   no lows   does not check sugars (needs new batter for accucheck)    taking 46 units Lantus    missed eye appt--she is going to rebook   .  HTN-takign meds every day   .  shortness of breath-did not do stress echo; it is not worse overall    can exercise but continues to get winded with walking 10  minutes  .  .  .  .  .  .  .  .  .  .  gained 13 lbs   GI sxs none  .  .  .  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  Past Medical  History (changes today):  Added new problem of COUGH (ICD-786.2) ([id number])  .  [name]  Medications (prior to today's visit):  LANTUS 100 UNIT/ML SUBCUTANEOUS SOLUTION (INSULIN GLARGINE) 47  units daily   as directed  LISINOPRIL-HYDROCHLOROTHIAZIDE 20-12.5 MG ORAL TABLET (LISINOPRIL-HYDROCHLO  ROTHIAZIDE) one tablet daily  ATORVASTATIN CALCIUM 20 MG ORAL TABLET (ATORVASTATIN CALCIUM) one tablet or  ally daily  SERTRALINE HCL 50 MG ORAL TABLET (SERTRALINE HCL) Take one tablet by mouth   once daily      * ACCUCHECK GLUCOMETER check sugars three times daily Dx: Diabetes type   II with retinopathy E11.319      RELION ALL-IN-ONE DEVICE (BLOOD GLUC METER DISP-STRIPS) Use three times   daily (whichever brand covered/with meter)      LANCETS (LANCETS) Use as directed.      INSULIN SYRINGE 30G X 1/2" 0.5 ML (INSULIN SYRINGE-NEEDLE U-100)       BD DISP NEEDLES 30G X 1/2" (NEEDLE (DISP)) use small needle to inject i  nsulin  .  Medications (after today's visit):  LANTUS 100 UNIT/ML SUBCUTANEOUS SOLUTION  (INSULIN GLARGINE) 48 units daily   as directed  LISINOPRIL-HYDROCHLOROTHIAZIDE 20-12.5 MG ORAL TABLET (LISINOPRIL-HYDROCHLO  ROTHIAZIDE) one tablet daily  ATORVASTATIN CALCIUM 20 MG ORAL TABLET (ATORVASTATIN CALCIUM) one tablet or  ally daily  SERTRALINE HCL 50 MG ORAL TABLET (SERTRALINE HCL) Take one tablet by mouth   once daily      * ACCUCHECK GLUCOMETER check sugars three times daily Dx: Diabetes type   II with retinopathy E11.319      RELION ALL-IN-ONE DEVICE (BLOOD GLUC METER DISP-STRIPS) Use three times   daily (whichever brand covered/with meter)      LANCETS (LANCETS) Use as directed.      INSULIN SYRINGE 30G X 1/2" 0.5 ML (INSULIN SYRINGE-NEEDLE U-100)       BD DISP NEEDLES 30G X 1/2" (NEEDLE (DISP)) use small needle to inject i  nsulin  TESSALON PERLES 100 MG ORAL CAPSULE (BENZONATATE) Take 2 capsules every 8 h  ours as needed for cough  .  Medications Reviewed:  Done  .  [name]  Allergies (prior to today's visit):  PENICILLIN V POTASSIUM (PENICILLIN V POTASSIUM) (Critical)  .  .  .  .  .  .  Vitals:   Ht: 64.5 in.  Wt: 224.6 lbs.  BMI (in-lb) 38.09  Temp: 98.7deg F.     BP (Initial [place] Screening): 129 / 75     BP (Rechecked, Actionable): 129 / 7  5 mmHg   Pulse Rate: 78 bpm O2 Sat: 99 %  .  [name]  Additional PE:   General: Well appearing  Head: NCAT  Gait: normal  Eyes-anicteric  Neck: supple, small shotty b/l lan  o/p no exudate   Psych: affect and mood appropriate   Chest CTA  CV RRR  .  [name]  .  Assessment /T/ Plan:   41 year old diabetic, htn and depression here for f/u mmp  and cough 1 week  .  sx that is different is sore throat now (1 centor); no evidence pna no feve  r, nl lung sounds, no sob, sat 99%. will check cxr given risk/7 day time co  urse with continued cough; for now supportive care  .  [name]  Last time  discussed denial and attitude around diabetes, stopping insulin   for 3 months, now back on  ;Deals with stress by isolating. Would do really   well with a Haematologist. She is  ambivalent. Agrees to  Zoloft to help her wi  th reactivity and irritability . Seems a bit improved but is not checking h  er sugars at all   .  [name]  SOB: ddx includes CHF, CAD, lung disease (clear lungs on exam) post nasal d  rip and GERD; Would recommend flonase otc for nose congestion and prilosec/  zantac and lifestyle for GERD.  Will  check stress echo and discussed impli  cations/to ER with prolonged episode. on statin. Will not add aspirin yet g  iven GERD sxs as well. Sx stable but not gone. Reorder. She understands imp  ortance of this.   .  .  . DM;  off insulin until August, now back on and not checking sugars; finan  cials are an issue    needs to get back on to checking sugars     no lows,a1c 8.6  increase lantus to 48 units      eye exam 9/16; mild DR, needs f/u --she wants to go outside [place] and w  ill book       changed to atorvastatin 20 mg LDL was 117, consider increase  .  .  .  .  2. bp controlled  .  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 Monitoring wit  h eye care,  book now   .  [name]  4. depression -on sertraline, improved now   .  [name]  Contraception-condoms; she would like to go to [facility] and discuss IU  D again (had prior)   .  [name]  Likely PCOS (advised weight loss)  Likely hidradenitis-no active boils now; use antibacterial and soaks; consi  der cleocin gel if continued  prevnar to give next time   last pap 5/15 (got result but not HPV results) She will check [facility] when next is due  f/u in one month for these multiple issues   .  [name]  Orders (this visit):  CXR [CPT-71020]  Stress Echo [CPT-93015]  RTC in 4 weeks [RTC-028]  Est Level 4 (1121/NP2292) [[id number]]  .  Patient Care Plan  .  [name]  Immunization Worksheet 2016   .  .  .  Patient Health Questionnaire (PHQ-9)  1.  Over the last 2 weeks how often have you been bothered by any of the fo  llowing problems?            Not at All (0) -  Several Days (1) -  More Than Half the Days (2)   -  Nearly Every Day (3)  a. Little interest  or pleasure in doing things..... 0  b. Feeling down, depressed, or hopeless...[name].  1  c. Trouble falling/staying asleep, sleeping too much..... 0  d. Feeling tired or having little energy.Marland KitchenMarland KitchenMarland Kitchen. 1  e. Poor appetite or overeating..... 0  f. Feeling bad about yourself - or that you are a failure or have let yours  elf or your family down..... 0  g. Trouble concentrating on things such as reading the newspaper or watchin  g television..... 0  h. Moving or speaking so slowly that other people could have noticed.   Or   the opposite - being so fidgety or restless that you have been moving aroun  d a lot more than usual..... 0  i. Thoughts that you would be better off dead or of hurting yourself in som  e way..... 0  Follow-Up PHQ-9: 2  .  PHQ-9 Screening: 2 scored  .  .  [name]  Electronically Signed by [name], MD on 12/12/2016 at 8:45 AM  ________________________________________________________________________

## 2016-12-11 NOTE — Progress Notes (Signed)
[facility] December 11, 2016  [address]   [place]  [place] [address]  Main: [phone]  Fax: [phone]  Patient Portal: [email or web address]            ERROR ......................................Marland KitchenCoralyn Pear  December 11, 2016 1:02 PM        Created By Coralyn Pear on 12/11/2016 at 01:01 PM    Electronically Signed By [name] MD on 12/19/2016 at 03:58 PM

## 2016-12-11 NOTE — Progress Notes (Signed)
General Medicine Visit    Service Due by Standard Protocol Rules: DIAB EYE EX, HGBA1C.      Initial Screening   * [place]: Perito (Tina)  Ht: 64.5 in.  Wt: 224.6 lbs.   BMI: 38.09  Temp: 98.7 deg F.     BP (Initial [place] Screening): 129 / 75      BP (Rechecked, Actionable): 129 / 75 mmHg   HR: 78    O2 Sat: 99 %  Travel outside of the [place] in past 28 days:: No  Chronic Pain Assessment: Does pt experience chronic pain ? NO  Smoking Assessment: Tobacco use? current every day smoker  Smoking Counseling? YES Quitworks form was provided today        Med List: PRINTED by [place] for patient   Med List: PRINTED by [employer] for patient   Patient consents for eRx History:  Paper    Falls Risk Assessment:   In the past year, have you ... Had no falls  Difficulty with balance? NO  Need assistance with ambulation while here? NO  Behavioral Health Tools:   PHQ2 Screening Score 1    PHQ9 Score 2  Comments: ......................................Marland KitchenCyndia Skeeters  December 11, 2016 12:09 PM      ......................................Marland KitchenCyndia Skeeters  December 11, 2016 12:09 PM      Basic Visit  Chief Complaint:   41 year old here with cough and f/u mmp     History of Present Illness:   Cough   started one week ago, getting worse mostly b/c feels sore in throat/ alot of mucous   pain in left chest with coughing, not with deep breath   has shortness of breath--chronic;  not worse   "a little cold" that comes up    not more now, is very stuffy     voice is hoarse   no headache now   did get flu shot this year   no earache   hasn't taken temp, doesn't think fevers     DM-did not go to eye Dr  hours are 8 am to 5 pm; job is in [place]   no lows   does not check sugars (needs new batter for accucheck)    taking 46 units Lantus    missed eye appt--she is going to rebook     HTN-takign meds every day     shortness of breath-did not do stress echo; it is not worse overall    can exercise but continues to get winded with walking 10 minutes                      gained  13 lbs   GI sxs none        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) (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)  HYPERTENSION (ICD-401.9) (ICD10-I10)  DEPRESSION (ICD-311) (ICD10-F32.9)  ANXIETY (ICD-300.00) (ICD10-F41.9)  ABNORMAL PAP SMEAR 12/11 LSIL; COLPO 12/11; BX CIN I; 6/12 ASCUS PAP; 4/13 PAP NEG; HR HPV POS, NEG COLPO; 5/14 PAP/HR HPV NEG (ICD-795.00) (ICD10-R87.619)  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)  PATELLAR DISLOCATION-MEDIAL RETINACULAR TEAR; RIGHT KNEE (ICD-836.3) (ICD10-S83.006)  OBESITY (ICD-278.00) (ICD10-E66.9)  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)  SEXUALLY TRANSMITTED DISEASE, EXPOSURE TO-H/O GC, TRICH, HPV, CHLAMYDIA (ICD-V01.6) (ICD10-Z20.2)  S/P CHOLECYSTECTOMY (ICD-V45.79) (ICD10-Z90.49)    Past Medical History (changes today):  Added new problem of COUGH (ICD-786.2) ([id number])  Medications (prior to today's visit):  LANTUS 100 UNIT/ML SUBCUTANEOUS SOLUTION (INSULIN GLARGINE) 47  units daily as directed  LISINOPRIL-HYDROCHLOROTHIAZIDE 20-12.5 MG ORAL TABLET (LISINOPRIL-HYDROCHLOROTHIAZIDE) one tablet daily  ATORVASTATIN CALCIUM 20 MG ORAL TABLET (ATORVASTATIN CALCIUM) one tablet orally daily  SERTRALINE HCL 50 MG ORAL TABLET (SERTRALINE HCL) Take one tablet by mouth once daily      * ACCUCHECK GLUCOMETER check sugars three times daily Dx: Diabetes type II with retinopathy E11.319      RELION ALL-IN-ONE DEVICE (BLOOD GLUC METER DISP-STRIPS) Use three times daily (whichever brand covered/with meter)      LANCETS (LANCETS) Use as directed.      INSULIN SYRINGE 30G X 1/2" 0.5 ML (INSULIN SYRINGE-NEEDLE U-100)       BD DISP NEEDLES 30G X 1/2" (NEEDLE (DISP)) use small needle to inject insulin    Medications (after today's visit):  LANTUS 100 UNIT/ML SUBCUTANEOUS SOLUTION (INSULIN GLARGINE) 48 units daily as  directed  LISINOPRIL-HYDROCHLOROTHIAZIDE 20-12.5 MG ORAL TABLET (LISINOPRIL-HYDROCHLOROTHIAZIDE) one tablet daily  ATORVASTATIN CALCIUM 20 MG ORAL TABLET (ATORVASTATIN CALCIUM) one tablet orally daily  SERTRALINE HCL 50 MG ORAL TABLET (SERTRALINE HCL) Take one tablet by mouth once daily      * ACCUCHECK GLUCOMETER check sugars three times daily Dx: Diabetes type II with retinopathy E11.319      RELION ALL-IN-ONE DEVICE (BLOOD GLUC METER DISP-STRIPS) Use three times daily (whichever brand covered/with meter)      LANCETS (LANCETS) Use as directed.      INSULIN SYRINGE 30G X 1/2" 0.5 ML (INSULIN SYRINGE-NEEDLE U-100)       BD DISP NEEDLES 30G X 1/2" (NEEDLE (DISP)) use small needle to inject insulin  TESSALON PERLES 100 MG ORAL CAPSULE (BENZONATATE) Take 2 capsules every 8 hours as needed for cough       Medications Reviewed:  Done      Allergies (prior to today's visit):  PENICILLIN V POTASSIUM (PENICILLIN V POTASSIUM) (Critical)                 Vitals:   Ht: 64.5 in.  Wt: 224.6 lbs.  BMI (in-lb) 38.09  Temp: 98.7deg F.     BP (Initial [place] Screening): 129 / 75     BP (Rechecked, Actionable): 129 / 75 mmHg   Pulse Rate: 78 bpm O2 Sat: 99 %      Additional PE:   General: Well appearing  Head: NCAT  Gait: normal  Eyes-anicteric  Neck: supple, small shotty b/l lan  o/p no exudate   Psych: affect and mood appropriate   Chest CTA  CV RRR        Assessment & Plan:   41 year old diabetic, htn and depression here for f/u mmp  and cough 1 week    sx that is different is sore throat now (1 centor); no evidence pna no fever, nl lung sounds, no sob, sat 99%. will check cxr given risk/7 day time course with continued cough; for now supportive care      Last time  discussed denial and attitude around diabetes, stopping insulin for 3 months, now back on  ;Deals with stress by isolating. Would do really well with a Haematologist. She is ambivalent. Agrees to Zoloft to help her with reactivity and irritability . Seems a bit improved but  is not checking her sugars at all       SOB: ddx includes CHF, CAD, lung disease (clear lungs on exam) post nasal drip and GERD; Would recommend flonase  otc for nose congestion and prilosec/zantac and lifestyle for GERD.  Will  check stress echo and discussed implications/to ER with prolonged episode. on statin. Will not add aspirin yet given GERD sxs as well. Sx stable but not gone. Reorder. She understands importance of this.       . DM;  off insulin until August, now back on and not checking sugars; financials are an issue    needs to get back on to checking sugars     no lows,a1c 8.6  increase lantus to 48 units      eye exam 9/16; mild DR, needs f/u --she wants to go outside [place] and will book       changed to atorvastatin 20 mg LDL was 117, consider increase                 2. 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 Monitoring with eye care,  book now       4. depression -on sertraline, improved now       Contraception-condoms; she would like to go to [facility] gyn and discuss IUD again (had prior)       Likely PCOS (advised weight loss)  Likely hidradenitis-no active boils now; use antibacterial and soaks; consider cleocin gel if continued     prevnar to give next time   last pap 5/15 (got result but not HPV results) She will check in gyn [facility] when next is due  f/u in one month for these multiple issues       Orders (this visit):  CXR [CPT-71020]  Stress Echo [CPT-93015]  RTC in 4 weeks [RTC-028]  Est Level 4 (4782/NF6213) [[id number]]    Patient Care Plan      Immunization Worksheet 2016         Patient Health Questionnaire (PHQ-9)  1.  Over the last 2 weeks how often have you been bothered by any of the following problems?            Not at All (0) -  Several Days (1) -  More Than Half the Days (2) -  Nearly Every Day (3)  a. Little interest or pleasure in doing things..... 0  b. Feeling down, depressed, or hopeless...[name].  1  c. Trouble falling/staying asleep,  sleeping too much..... 0  d. Feeling tired or having little energy.Marland KitchenMarland KitchenMarland Kitchen. 1  e. Poor appetite or overeating..... 0  f. Feeling bad about yourself - or that you are a failure or have let yourself or your family down..... 0  g. Trouble concentrating on things such as reading the newspaper or watching television..... 0  h. Moving or speaking so slowly that other people could have noticed.   Or the opposite - being so fidgety or restless that you have been moving around a lot more than usual..... 0  i. Thoughts that you would be better off dead or of hurting yourself in some way..... 0  Follow-Up PHQ-9: 2    PHQ-9 Screening: 2 scored          Created By Cyndia Skeeters on 12/11/2016 at 12:02 PM    Electronically Signed By [name] MD on 12/12/2016 at 08:45 AM

## 2017-02-13 ENCOUNTER — Ambulatory Visit

## 2017-02-13 ENCOUNTER — Ambulatory Visit: Admitting: Internal Medicine

## 2017-02-13 ENCOUNTER — Ambulatory Visit: Admit: 2017-02-13 | Payer: HMO

## 2017-02-13 LAB — HX IMMUNOLOGY
HX BETA HCG QUANT: 88428.4 m[IU]/mL — ABNORMAL HIGH (ref 0.0–5.0)
HX HEPATITIS C ANTIBODY: NONREACTIVE
HX HIV 1-2: NONREACTIVE
HX TREPONEMAL ANTIBODY: NONREACTIVE

## 2017-02-13 LAB — HX HEM-ROUTINE
HX HCT: 31.5 % — ABNORMAL LOW (ref 32.0–45.0)
HX HGB: 10.7 g/dL — ABNORMAL LOW (ref 11.0–15.0)
HX MCH: 30.7 pg (ref 26.0–34.0)
HX MCHC: 34 g/dL (ref 32.0–36.0)
HX MCV: 90.5 fL (ref 80.0–98.0)
HX MPV: 9.4 fL (ref 9.1–11.7)
HX NRBC #: 0 10*3/uL
HX NUCLEATED RBC: 0 %
HX PLT: 393 10*3/uL (ref 150–400)
HX RBC BLOOD COUNT: 3.48 M/uL — ABNORMAL LOW (ref 3.70–5.00)
HX RDW: 15.2 % — ABNORMAL HIGH (ref 11.5–14.5)
HX WBC: 10 10*3/uL (ref 4.0–11.0)

## 2017-02-13 LAB — HX CHEM-PANELS
HX ANION GAP: 11 (ref 5–18)
HX BLOOD UREA NITROGEN: 11 mg/dL (ref 6–24)
HX CHLORIDE (CL): 104 meq/L (ref 98–110)
HX CO2: 23 meq/L (ref 20–30)
HX CREATININE (CR): 0.8 mg/dL (ref 0.57–1.30)
HX GFR, AFRICAN AMERICAN: 106 mL/min/{1.73_m2}
HX GFR, NON-AFRICAN AMERICAN: 91 mL/min/{1.73_m2}
HX POTASSIUM (K): 4.3 meq/L (ref 3.6–5.1)
HX SODIUM (NA): 138 meq/L (ref 135–145)

## 2017-02-13 LAB — HX OTHER TESTS: HX HEPATITIS C ANTIBODY: NONREACTIVE

## 2017-02-13 LAB — HX CHEM-METABOLIC: HX THYROID STIMULATING HORMONE (TSH): 0.88 u[IU]/mL (ref 0.35–4.94)

## 2017-02-13 LAB — HX POINT OF CARE: HX HGB A1C, POC: 8.1 % — ABNORMAL HIGH (ref 4.3–5.8)

## 2017-02-13 LAB — HX HIV TESTING: HX HIV 1-2: NONREACTIVE

## 2017-02-13 MED ORDER — PRENATAL VITAMINS: 90 | 5 refills | 0 days

## 2017-02-13 MED ORDER — ATORVASTATIN CALCIUM: 1 | 90 | 1 refills | 0 days | Status: DC

## 2017-02-13 MED ORDER — NIFEDIPINE: 1 | 30 | 3 refills | 0 days | Status: DC

## 2017-02-13 NOTE — Progress Notes (Signed)
[facility] February 13, 2017  [address]   [place]  [place] [address]  Main: [phone]  Fax: [phone]  Patient Portal: [email or web address]            Amica Vanduyn  [address]  [place], [place]  [address]  [facility] Clinical Summary   [name]  DOB: [birth year]   MR#: [id number]  Primary Care Doctor: [name], MD  [phone]    Summary of visit February 13, 2017 with [name], MD:   stop hctz/lisinopril  stop atorvastatin  start prenatal vitamin  start nifedipine 60 mg    Labs, Tests and Referrals ordered:  CBC  [CPT-85027]  LYTES [CPT-82495]  BUN [CPT-84520]  Creatinine (CR) [CPT-82565]  HCG, serum quant (HCGQUAN) [CPT-84703]  Thyroid Stimulating Hormone (TSH) [CPT-84439]  HIV antibody (verbal consent) [CPT-86703]  RPR [CPT-86592]  Hep C Ab (HEPC) [CPT-86803]    Medications added or changed today include:  ATORVASTATIN CALCIUM 40 MG ORAL TABLET (ATORVASTATIN CALCIUM) Take one tablet by mouth once daily  NIFEDIPINE ER 60 MG ORAL TABLET EXTENDED RELEASE 24 HOUR (NIFEDIPINE) Take one tablet by mouth once daily  CALNA ORAL TABLET (PRENATAL VITAMINS) one tablet daily      Medications removed today include:  SERTRALINE HCL 50 MG ORAL TABLET (SERTRALINE HCL) Take one tablet by mouth once daily  TESSALON PERLES 100 MG ORAL CAPSULE (BENZONATATE) Take 2 capsules every 8 hours as needed for cough    For a complete listing of all of your current medications, please see the last page of this Clinical Visit Summary.    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)  HYPERTENSION (ICD-401.9)  (ICD10-I10)  SHORTNESS OF BREATH (ICD-786.05) (ICD10-R06.02)  ANNUAL EXAM (ICD-V72.31) (ICD10-Z00.00)  DEPRESSION (ICD-311) (ICD10-F32.9)      ANXIETY (ICD-300.00) (ICD10-F41.9)  ABNORMAL PAP SMEAR 12/11 LSIL; COLPO 12/11; BX CIN I; 6/12 ASCUS PAP; 4/13 PAP NEG; HR HPV POS, NEG COLPO; 5/14 PAP/HR HPV NEG (ICD-795.00) (ICD10-R87.619)  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  For more information go to [email or web address]  Categories:   Underweight = <18.5   Normal weight = 18.5-24.9   Overweight = 25-29.9           Obesity = BMI of 30 or greater    Most recent blood tests ordered by your Primary Care team as of 02/13/2017:  Cholesterol Tests   Cholesterol 182 Normal = 110-199 08/15/2016   HDL (good cholesterol) 42 Normal > 40 08/15/2016   Triglyceride 114 Normal = 40-250 08/15/2016   LDL (bad cholesterol) 117 Normal < 160  Normal < 130 if you have high blood pressure  Normal < 100 if you have heart disease or  diabetes 08/15/2016   Diabetes Tests   Glucose random  Normal = 60-150    HgbA1c 8.6 Normal = 4.3-5.8 12/11/2016   Blood Count Tests   Hematocrit 35.2 Normal = 37-47 08/15/2016   White Blood Cells 8.0 Normal = 4-11 08/15/2016   Platelets 338 Normal = 150-400 08/15/2016   Liver Tests   ALT 14 Normal = 0-54 08/15/2016   AST 13 Normal = 10-42 08/15/2016   Kidney Tests   BUN 15 Normal = 6-24 08/15/2016   Creatinine 0.97 Normal =  0.57-1.30 08/15/2016   GFR (Non African-American) 73 Normal > 60 08/15/2016   GFR (African-American) 84 Normal > 60 08/15/2016     General Medical Advice:  If you think any of the information provided in this summary is incorrect, please notify your provider.    Always carry a list of current medications with you . Provide an updated list to your Primary care Physician or any provider who prescribes your medication.    We have a doctor on call during our off hours--if you have an urgent problem and need to speak with a doctor when our office is closed, please call [phone].    Please help [place] improve the care that we provide.    Complete our Patient Satisfaction Survey at [email or web address]     [facility] Medication List  [facility]  Tiffiany Solly  DOB: 1976-01-04   MR#: [id number]  Primary Care Doctor: [name], MD  [phone]    Medications on 02/13/2017:  LANTUS 100 UNIT/ML SUBCUTANEOUS SOLUTION (INSULIN GLARGINE) 48 units daily as directed  LISINOPRIL-HYDROCHLOROTHIAZIDE 20-12.5 MG ORAL TABLET (LISINOPRIL-HYDROCHLOROTHIAZIDE) one tablet daily  ATORVASTATIN CALCIUM 40 MG ORAL TABLET (ATORVASTATIN CALCIUM) Take one tablet by mouth once daily      * ACCUCHECK GLUCOMETER check sugars three times daily Dx: Diabetes type II with retinopathy E11.319      RELION ALL-IN-ONE DEVICE (BLOOD GLUC METER DISP-STRIPS) Use three times daily (whichever brand covered/with meter)      LANCETS (LANCETS) Use as directed.      INSULIN SYRINGE 30G X 1/2" 0.5 ML (INSULIN SYRINGE-NEEDLE U-100)       BD DISP NEEDLES 30G X 1/2" (NEEDLE (DISP)) use small needle to inject insulin  NIFEDIPINE ER 60 MG ORAL TABLET EXTENDED RELEASE 24 HOUR (NIFEDIPINE) Take one tablet by mouth once daily  CALNA ORAL TABLET (PRENATAL VITAMINS) one tablet daily    Allergies on 02/13/2017: (if it says "NKA" below, that means "No known allergies")  PENICILLIN V POTASSIUM (PENICILLIN V POTASSIUM) (Critical)      Advice  about Medications:  Always carry a list of current medications with you . Provide an updated list to your Primary care Physician or any provider who prescribes your medication.    Remember to keep your list updated. Include all over the counter medications such as vitamins and herbals.    Discard all old medication lists.    If you are a specialist seeing this patient and you have made changes to any of the medications above,   please mark the changes on this medication list.      Created By [name] MD on 02/13/2017 at 02:33 PM    Electronically Signed By [name] MD on 02/13/2017 at 02:33 PM

## 2017-02-13 NOTE — Progress Notes (Signed)
General Medicine Visit  .  Service Due by Standard Protocol Rules: DIAB EYE EX, HGBA1C.    .  Initial Screening   * [place]: Sheikh ([name])  Ht: 64.5 in.  Wt: 220.4 lbs.   BMI: 37.38  Temp: 98.1 deg F.     BP (Initial [place] Screening): 120 / 68      BP (Rechecked, Actionable): 120 /   68 mmHg   HR: 100     Healthy Eating materials? Weight Education Handout for high BMI  Travel outside of the [place] in past 28 days:: No  Chronic Pain Assessment: Does pt experience chronic pain ? NO  Have you received a Flu shot recently (where)? Other  Smoking Assessment: Tobacco use? never smoker  .  [name]  Self Management materials provided? Printed handout: Flu Season Symptom Che  cklist  .  Med List: PRINTED by [place] for patient   Med List: PRINTED by [place] for patient   Patient consents for eRx History:  Paper  .  Falls Risk Assessment:   In the past year, have you ... Had no falls  Difficulty with balance? NO  Need assistance with ambulation while here? NO  Comments: Medication List given.  .  ......................................Marland KitchenNafisa Sheikh  February 13, 2017 1:00 PM  .  Basic Visit  Chief Complaint:   41 year old here for annual and with missed menses   .  History of Present Illness:   1. Missed menses   has had irregular in the past but not lately   lmp Feb 2018   did home pregnancy test-negative   has had unprotected sex since Feb  .  2. DM: a1c today is 32 which is improved   still on 48 units Lantus   feels has had a few lows in early AM--feels like needs to eat, clammy feel  ing    hasn't happened often   may have happened with lighter dinner the night before   misses about once/week Lantus   checks sugars about 4 days/week 120-130 before meals   Doesn't check sugars if feels hungry/low ; or after   .  3. HTN-takes medications daily  .  SOB--hasn't changed. did stress echo today.   .  .  .  .  .  Family History: (reviewed)   father-deceased, dm, chf, prostate cancer,   mother-alive-dm,   brother-no med problems  no kids  .  Social History:  (reviewed)   [place]; dad lived with them off and on; lived with mom, br  other;there was a time dad was cheating;   .  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  Diet:diabetic "I need to lose weight"; 24 hour recall; AM sausage, eggs, on  e piece toast; water; no lunch yet; dinner last night-turkey sub, sometimes   chips at work; sweets-few days/week  Exercise: could do more; now and then  Car safety-seatbelts/texting:discussed  .  Partner: has  boyfriend now; using condoms 90%  DV:no  STI history:    gc, chlamydia, hpv, herpes                    HIV testing:2016 tested  Contraception: condoms, gyn [facility] upcoming to discuss reinsert iud   Abnormal pap history: 9/10 ascus; colpo neg; 12/11  colpo lsil; 6/12 ascus,   hpv not done; 4/13 pap neg, hr hpv pos, colpo neg; 5/14 pap/hpv neg;  pap M  ay 2015-neg pap, did not receive hpv results  .  Last CE: 5/15; 1/17; 4/18  Pap: 5/15 pap neg (HPV sent but I didn't get result); pt prefers gyn  Mammo:ni; 8/17 on mammo van  Colo:ni  BMD:ni  Eye care:10/15;9/16, wants 6 month f/u   Dental: utd  Choleseterol:11/15 LDL over 100, rec'd statin  Risk Score Cholesterol: on high intensity statin  Tdap: 2012  MMR: vax 1978, 1994  Varicella:kid  Hep B:3 vax  Pvax:2011  Flu 2015, 2016  Zoster:ni  HPV:ni  .  .  .  ROS:  General: Denies fevers, chills, sweats, fatigue, weight loss, sleep disturb  ance.   Eyes: Denies vision loss.   Ears/Nose/Throat: Denies earache, tinnitus, sore throat, dysphagia.   Cardiovascular: Denies chest pains, palpitations, dyspnea on exertion, orth  opnea, PND, peripheral edema.   Respiratory: Denies cough, dyspnea, hemoptysis, wheezing.   Gastrointestinal: Denies nausea, vomiting, diarrhea, constipation, change i  n bowel habits, abdominal pain, hematochezia, heartburn.  Gyn/GU-negative    Musculoskeletal: Denies back pain, joint pain, joint swelling.   Skin: Denies rash  Neurologic: Denies weakness or memory issues  Psychiatric: Denies depression, anxiety,    Endocrine: Denies cold intolerance, heat intolerance, polydipsia, polyphagi  a, polyuria.   Heme/Lymphatic: Denies abnormal bruising. Sleep: denies disturbance   .  .  .  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  Past Medical History (changes today):  Removed problem of COUGH (ICD-786.2) (ICD10-R05)  Added new problem of HIDRADENITIS (ICD-705.83) (ICD10-L73.2)  Added new problem of AMENORRHEA (ICD-626.0) (ICD10-N91.2)  .  [name]  Medications (prior to today's visit):  LANTUS 100 UNIT/ML SUBCUTANEOUS SOLUTION (INSULIN GLARGINE) 48 units daily   as directed  LISINOPRIL-HYDROCHLOROTHIAZIDE 20-12.5 MG ORAL TABLET (LISINOPRIL-HYDROCHLO  ROTHIAZIDE) one tablet daily  ATORVASTATIN CALCIUM 20 MG ORAL TABLET (ATORVASTATIN CALCIUM) one tablet or  ally daily  SERTRALINE HCL 50 MG ORAL TABLET (SERTRALINE HCL) Take one tablet by mouth   once daily      * ACCUCHECK GLUCOMETER check sugars three times daily Dx: Diabetes type   II with  retinopathy E11.319      RELION ALL-IN-ONE DEVICE (BLOOD GLUC METER DISP-STRIPS) Use three times   daily (whichever brand covered/with meter)      LANCETS (LANCETS) Use as directed.      INSULIN SYRINGE 30G X 1/2" 0.5 ML (INSULIN SYRINGE-NEEDLE U-100)       BD DISP NEEDLES 30G X 1/2" (NEEDLE (DISP)) use small needle to inject i  nsulin  TESSALON PERLES 100 MG ORAL CAPSULE (BENZONATATE) Take 2 capsules every 8 h  ours as needed for cough  .  Medications (after today's visit):  LANTUS 100  UNIT/ML SUBCUTANEOUS SOLUTION (INSULIN GLARGINE) 48 units daily   as directed  LISINOPRIL-HYDROCHLOROTHIAZIDE 20-12.5 MG ORAL TABLET (LISINOPRIL-HYDROCHLO  ROTHIAZIDE) one tablet daily  ATORVASTATIN CALCIUM 40 MG ORAL TABLET (ATORVASTATIN CALCIUM) Take one tabl  et by mouth once daily      * ACCUCHECK GLUCOMETER check sugars three times daily Dx: Diabetes type   II with retinopathy E11.319      RELION ALL-IN-ONE DEVICE (BLOOD GLUC METER DISP-STRIPS) Use three times   daily (whichever brand covered/with meter)      LANCETS (LANCETS) Use as directed.      INSULIN SYRINGE 30G X 1/2" 0.5 ML (INSULIN SYRINGE-NEEDLE U-100)       BD DISP NEEDLES 30G X 1/2" (NEEDLE (DISP)) use small needle to inject i  nsulin  NIFEDIPINE ER 60 MG ORAL TABLET EXTENDED RELEASE 24 HOUR (NIFEDIPINE) Take   one tablet by mouth once daily  CALNA ORAL TABLET (PRENATAL VITAMINS) one tablet daily  .  .  .  Allergies (prior to today's visit):  PENICILLIN V POTASSIUM (PENICILLIN V POTASSIUM) (Critical)  .  Allergies Reviewed:  Done  .  .  .  .  .  Vitals:   Ht: 64.5 in.  Wt: 220.4 lbs.  BMI (in-lb) 37.38  Temp: 98.1deg F.     BP (Initial [place] Screening): 120 / 68     BP (Rechecked, Actionable): 120 / 6  8 mmHg   Pulse Rate: 100 bpm  .  .  Additional PE:   Gen: Alert, NAD, well hydrated, well developed  Head: NCAT  Ears: no external deformities, canals clear, TMs pearly gray, gross hearing   intact  Eyes: EOMI, PERRL, no scleral icterus, no conjunctival injection, vision    grossly normal  Nose: no bleeding  Mouth: Good dentition, no lesions, tongue midline, no exudate/erythema  Neck: Supple, no ant/post cervical or supraclavicular lymphadenopathy,   no carotid bruits, thyroid not palpable  CV: RRR, no M/R/G, no edema, DP 2+ b/l  .  Pulm: CTA b/l, resp effort wnl  Abd: soft, nontender, nondistended, no abdominal bruits, no   hepatosplenomegaly,  Musc: station and gait wnl, moves all extremities without obvious   limitations  Skin: No rashes, no obvious bruising, no suspicious lesions  Neuro: CN 2-12 intact, DTR symmetric throughout, hand grip 5/5 b/l, no   sensory deficits  Psych: Mood and affect appropriate  .  Breast: no masses; Axilla-no lymphadenopathy  Vagina-defer   .  .  .  .  Assessment /T/ Plan:   41 year old diabetic, htn and depression here for annual and with missed me  nses  .  Missed menses x 2 months. UCG today positive. Pt unsure what she wants to d  o. Reflective listening provided, She had termination x 1 in the past. She   is 40. She may want to proceed with pregnancy.    Will start prenatal vitamin     d/c atorvastatin d/c lisinopril/hctz   start nifedipine 60 mg  .  .  SOB: ddx includes CHF, CAD, lung disease (clear lungs on exam) post nasal d  rip and GERD; Would recommend flonase otc for nose congestion and prilosec/  zantac and lifestyle for GERD.  Will  check stress echo and discussed impli  cations/to ER with prolonged episode. on statin. Will not add aspirin yet g  iven GERD sxs as well. Sx stable but not gone. Done today, await results   .  .  . DM;  off insulin  last summe2017 , now back on and not checking sugars; fi  nancials are an issue    needs to get back on to checking sugars     no lows,a1c 8.1  4/18    lantus to 48 units; a few      eye exam 9/16; mild DR, needs f/u --she wants to go outside [place] and w  ill book       changed to atorvastatin 40 mg (LDL 117) but hold for now/pregnancy  .  .  .  .  2. bp controlled; change to nifedipine for  pregnancy  .  .  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 Monitoring wit  h eye care,  book now   .  [name]  4. depression/reactivity/irritability  -stopped sertraline, currently impro  ved   .  [name]  Contraception-now pregnancy, consdiering   .  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  [name]  Orders (this visit):  CBC  [CPT-85027]  LYTES [CPT-82495]  BUN [CPT-84520]  Creatinine (CR) [CPT-82565]  HCG, serum quant (HCGQUAN) [CPT-84703]  Thyroid Stimulating Hormone (TSH) [CPT-84439]  HIV antibody (verbal consent) [CPT-86703]  RPR [CPT-86592]  Hep C Ab (HEPC) [CPT-86803]  Est Level 3 [CPT-99213]  Est Preventive 40-64yo [CPT-99396]  RTC in 2 weeks [RTC-014]  .  Patient Care Plan  .  [name]  Immunization Worksheet 2016   .  .  .  .  .  .  Electronically Signed by [name], MD on 02/13/2017 at 2:33 PM  ________________________________________________________________________

## 2017-02-13 NOTE — Progress Notes (Signed)
Checkout  Return to Clinic 2 weeks  Next Visit Notes: Overbooked per Dr. [name]  Appointment Made Yes  RTC Date: 02/26/2017  Time: 11:20am          Created By Court Joy on 02/13/2017 at 02:42 PM    Electronically Signed By Court Joy on 02/13/2017 at 02:42 PM

## 2017-02-14 ENCOUNTER — Ambulatory Visit

## 2017-02-14 NOTE — Progress Notes (Signed)
[facility] February 14, 2017  [address]   [place]  [place] [address]  Main: [phone]  Fax: [phone]  Patient Portal: [name]  [address]  [place], [place]  [address]          MR#: [id number]          DOB: 1976-02-10    Dear  [name],    The results of the tests performed during your visit are as follows:  Diabetes Tests   HgbA1c 8.1 Normal = 4.3-5.8 02/13/2017     Blood Count Tests   Hematocrit 31.5 Normal = 32-45 (Women)  Normal = 37-47 (Men) 02/13/2017   White Blood Cells 10.0 Normal = 4-11 02/13/2017   Platelets 393 Normal = 150-400 02/13/2017     Thyroid Test   TSH 0.88 Normal = 0.35-4.94 02/13/2017     Kidney Tests   BUN 11 Normal = 6-24 02/13/2017   Creatinine 0.80 Normal = 0.57-1.30 02/13/2017   Potassium 4.3 MEQ/L Normal = 3.6-5.1 02/13/2017   GFR (Non African-American) 91 Normal > 60 02/13/2017   GFR (African-American) 106 Normal > 60 02/13/2017     Other Tests   Hep C Negative  02/13/2017   HIV Negative  02/13/2017   Syphilis Negative  02/13/2017     Please let me know if I can help you making your decision and I will see you on April 23rd.    Sincerely,       [name], MD  [facility]  [phone]    **Please be aware of our new extended hours to better care for you. Hours are: Monday through Thursday from 8 am to 8 pm; Friday from 8 am to 5 pm; Saturday from 8 am to 12 noon.        Created By Coralyn Pear on 02/14/2017 at 03:48 PM    Electronically Signed By [name] MD on 02/14/2017 at 03:59 PM

## 2017-02-15 ENCOUNTER — Ambulatory Visit

## 2017-02-15 MED ORDER — PRENATAL VIT-FE FUMARATE-FA: 30 | 11 refills | 0 days | Status: DC

## 2017-02-15 MED ORDER — PRENATAL VITAMINS: 90 | 5 refills | 0 days

## 2017-02-15 NOTE — Telephone Encounter (Signed)
Call Details:   Patient PCP = [name], MD  [name] (Patient) called on February 15, 2017 8:20 AM.  Message taken by: Nancy Nordmann  Primary call-back number: [phone]    Secondary call-back number: [phone]    Call Reason(s): Prescription        ** REQUESTING A PRESCRIPTION OR REFILL.  Preferred Pharmacy:        Phone:   Fax:     Provide Prescription to Pharmacy  Medications to refill:  Calna oral tablet : one tablet daily    ---------- ---------- ---------- ---------- ---------- ----------     Extra Medication Info:  [facility]  [address] [place], [place]  [address]     Phone: [phone]    Fax: [phone]    Prescriptions:  CALNA ORAL TABLET (PRENATAL VITAMINS) one tablet daily  #90[Tablet] x 5   Entered by: Lamar Blinks RN   Authorized by: [name], MD   Signed by: Lamar Blinks RN on 02/15/2017   Method used: Electronically to      [facility] (retail)     [address]     [address]     [place], [place]  [address]     Ph: [phone]     Fax: [phone]   RxID: [id number]      RESPONSE/ORDERS:                 ORDERS/PROBS/MEDS/ALL     Problems:   AMENORRHEA (ICD-626.0) (ICD10-N91.2)  HIDRADENITIS (ICD-705.83) (ICD10-L73.2)  DIABETES MELLITUS, TYPE II, 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) (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)  HYPERTENSION (ICD-401.9) (ICD10-I10)  SHORTNESS OF BREATH (ICD-786.05) (ICD10-R06.02)  ANNUAL EXAM (ICD-V72.31) (ICD10-Z00.00)  DEPRESSION (ICD-311) (ICD10-F32.9)      ANXIETY (ICD-300.00) (ICD10-F41.9)  ABNORMAL PAP SMEAR 12/11 LSIL; COLPO 12/11; BX CIN I; 6/12 ASCUS PAP; 4/13 PAP NEG; HR HPV POS, NEG COLPO; 5/14 PAP/HR HPV NEG (ICD-795.00) (ICD10-R87.619)  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)  PATELLAR DISLOCATION-MEDIAL RETINACULAR TEAR; RIGHT KNEE (ICD-836.3) (ICD10-S83.006)  OBESITY (ICD-278.00) (ICD10-E66.9)  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)  SEXUALLY TRANSMITTED DISEASE, EXPOSURE TO-H/O GC, TRICH, HPV, CHLAMYDIA (ICD-V01.6) (ICD10-Z20.2)  S/P CHOLECYSTECTOMY  (ICD-V45.79) (ICD10-Z90.49)    Allergies:   PENICILLIN V POTASSIUM (PENICILLIN V POTASSIUM) (Critical)    Meds (prior to this call):   LANTUS 100 UNIT/ML SUBCUTANEOUS SOLUTION (INSULIN GLARGINE) 48 units daily as directed  LISINOPRIL-HYDROCHLOROTHIAZIDE 20-12.5 MG ORAL TABLET (LISINOPRIL-HYDROCHLOROTHIAZIDE) one tablet daily  ATORVASTATIN CALCIUM 40 MG ORAL TABLET (ATORVASTATIN CALCIUM) Take one tablet by mouth once daily      * ACCUCHECK GLUCOMETER check sugars three times daily Dx: Diabetes type II with retinopathy E11.319      RELION ALL-IN-ONE DEVICE (BLOOD GLUC METER DISP-STRIPS) Use three times daily (whichever brand covered/with meter)      LANCETS (LANCETS) Use as directed.      INSULIN SYRINGE 30G X 1/2" 0.5 ML (INSULIN SYRINGE-NEEDLE U-100)       BD DISP NEEDLES 30G X 1/2" (NEEDLE (DISP)) use small needle to inject insulin  NIFEDIPINE ER 60 MG ORAL TABLET EXTENDED RELEASE 24 HOUR (NIFEDIPINE) Take one tablet by mouth once daily  CALNA ORAL TABLET (PRENATAL VITAMINS) one tablet daily    Changes to Meds (this update):   Rx of CALNA ORAL TABLET (PRENATAL VITAMINS) one tablet daily;  #90[Tablet] x 5;  Signed;  Entered by: Lamar Blinks RN;  Authorized by: [name], MD;  Method used: Electronically to [facility] [address], [place], [place]  [address],  Ph: [phone], Fax: [phone]        Created By Nancy Nordmann on 02/15/2017 at 08:20 AM    Electronically Signed By Lamar Blinks RN on 02/15/2017 at 10:19 AM

## 2017-02-15 NOTE — Telephone Encounter (Signed)
Call Details:   Patient PCP = [name], MD  [facility] called on February 15, 2017 11:04 AM.  Message taken by: Bartholomew Crews  Primary call-back number: [phone]    Secondary call-back number: () -    Call Reason(s): Message/Call-Back      ** MESSAGE / CALL-BACK.    ---------- ---------- ---------- ---------- ---------- ----------     Extra Medication Info:  Calna oral tablet : one tablet daily  **Pharmacy states that they dont have this medication there, request an alternative because they cant find this medication**  Prescriptions:  GNP PRENATAL VITAMINS 28-0.8 MG ORAL TABLET (PRENATAL VIT-FE FUMARATE-FA) take one by mouth daily  #30[Unspecified] x 11   Entered by: Lamar Blinks RN   Authorized by: [name], MD   Signed by: Lamar Blinks RN on 02/15/2017   Method used: Electronically to      [facility]     [address]     [address]     [place], [place]  [address]     Ph: [phone]     Fax: [phone]   RxID: [id number]  GNP PRENATAL VITAMINS 28-0.8 MG ORAL TABLET (PRENATAL VIT-FE FUMARATE-FA) take one by mouth daily  #30[Unspecified] x 11   Entered by: Lamar Blinks RN   Authorized by: [name], MD   Signed by: Lamar Blinks RN on 02/15/2017   Method used: Electronically to      [facility]     [address]     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)  HYPERTENSION (ICD-401.9) (ICD10-I10)  SHORTNESS OF BREATH (ICD-786.05)  (ICD10-R06.02)  ANNUAL EXAM (ICD-V72.31) (ICD10-Z00.00)  DEPRESSION (ICD-311) (ICD10-F32.9)      ANXIETY (ICD-300.00) (ICD10-F41.9)  ABNORMAL PAP SMEAR 12/11 LSIL; COLPO 12/11; BX CIN I; 6/12 ASCUS PAP; 4/13 PAP NEG; HR HPV POS, NEG COLPO; 5/14 PAP/HR HPV NEG (ICD-795.00) (ICD10-R87.619)  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)  PATELLAR DISLOCATION-MEDIAL RETINACULAR TEAR; RIGHT KNEE (ICD-836.3) (ICD10-S83.006)  OBESITY (ICD-278.00) (ICD10-E66.9)  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)  SEXUALLY TRANSMITTED DISEASE, EXPOSURE TO-H/O GC, TRICH, HPV, CHLAMYDIA (ICD-V01.6) (ICD10-Z20.2)  S/P CHOLECYSTECTOMY (ICD-V45.79) (ICD10-Z90.49)    Allergies:   PENICILLIN V POTASSIUM (PENICILLIN V POTASSIUM) (Critical)    Meds (prior to this call):   LANTUS 100 UNIT/ML SUBCUTANEOUS SOLUTION (INSULIN GLARGINE) 48 units daily as directed  LISINOPRIL-HYDROCHLOROTHIAZIDE 20-12.5 MG ORAL TABLET (LISINOPRIL-HYDROCHLOROTHIAZIDE) one tablet daily  ATORVASTATIN CALCIUM 40 MG ORAL TABLET (ATORVASTATIN CALCIUM) Take one tablet by mouth once daily      * ACCUCHECK GLUCOMETER check sugars three times daily Dx: Diabetes type II with retinopathy E11.319      RELION ALL-IN-ONE DEVICE (BLOOD GLUC METER DISP-STRIPS) Use three times daily (whichever brand covered/with meter)      LANCETS (LANCETS) Use as directed.      INSULIN SYRINGE 30G X 1/2" 0.5 ML (INSULIN SYRINGE-NEEDLE U-100)       BD DISP NEEDLES 30G  X 1/2" (NEEDLE (DISP)) use small needle to inject insulin  NIFEDIPINE ER 60 MG ORAL TABLET EXTENDED RELEASE 24 HOUR (NIFEDIPINE) Take one tablet by mouth once daily  CALNA ORAL TABLET (PRENATAL VITAMINS) one tablet daily    Changes to Meds (this update):   Changed medication from CALNA ORAL TABLET (PRENATAL VITAMINS) one tablet daily to [facility] PRENATAL VITAMINS 28-0.8 MG ORAL TABLET (PRENATAL VIT-FE FUMARATE-FA) take one by mouth daily - Signed  Rx of GNP PRENATAL VITAMINS 28-0.8 MG ORAL TABLET (PRENATAL VIT-FE FUMARATE-FA) take one by  mouth daily;  #30[Unspecified] x 11;  Signed;  Entered by: Lamar Blinks RN;  Authorized by: [name], MD;  Method used: [facility] [address], [place], [place]  [address], Ph: [phone], Fax: [phone]  Rx of GNP PRENATAL VITAMINS 28-0.8 MG ORAL TABLET (PRENATAL VIT-FE FUMARATE-FA) take one by mouth daily;  #30[Unspecified] x 11;  Signed;  Entered by: Lamar Blinks RN;  Authorized by: [name], MD;  Method used: [facility] to [facility] [address], [place], [place]  [address], Ph: [phone], Fax: [phone]        Created By Bartholomew Crews on 02/15/2017 at 11:04 AM    Electronically Signed By Lamar Blinks RN on 02/15/2017 at 03:33 PM

## 2017-02-16 ENCOUNTER — Ambulatory Visit

## 2017-02-16 MED ORDER — INSULIN GLARGINE: 1 | 1 | 1 refills | 0 days

## 2017-02-26 ENCOUNTER — Ambulatory Visit

## 2017-02-26 ENCOUNTER — Ambulatory Visit: Admitting: Internal Medicine

## 2017-02-26 ENCOUNTER — Ambulatory Visit: Admit: 2017-02-26 | Payer: HMO

## 2017-02-26 NOTE — Progress Notes (Signed)
General Medicine Visit    Service Due by Standard Protocol Rules: DIAB EYE EX.      Initial Screening   * Mamou: [name] ([name])  Ht: 64.5 in.  Wt: 216.6 lbs.   BMI: 36.74  Temp: 97.6 deg F.     BP (Initial [place] Screening): 127 / 81      BP (Rechecked, Actionable): 127 / 81 mmHg   HR: 97     Healthy Eating materials? Weight Education Handout for high BMI  Travel outside of the [place] in past 28 days:: No  HCP materials? Printed  Chronic Pain Assessment: Does pt experience chronic pain ? NO  Have you received a Flu shot recently (where)? Other  Smoking Assessment: Tobacco use? smoker-current status unknown      Self Management materials provided? Printed handout: Health Care Proxy and Medical Decision Making    Med List: PRINTED by [place] for patient   Med List: PRINTED by [place] for patient   Patient consents for eRx History:  Paper    Falls Risk Assessment:   In the past year, have you ... Had no falls  Difficulty with balance? NO  Need assistance with ambulation while here? NO  Comments: Medication List given.      ......................................Marland KitchenNafisa [name]  February 26, 2017 11:25 AM    Basic Visit  Chief Complaint:   41 year old here for f/u     History of Present Illness:   Pregnant-decided to keep the baby   really wants to take care of herself and have a healthy baby   LMP end January    no alcohol   staying off  MJ   no pets     DM-now eating healthy-cut out snacks and sweets   may have had some AM lows but doesn't have a glucometer   some decrease in AM eating      HTN: switched to nifedipine; no dizzyness                      lost 4 lbs mild AM  nausea  breathing about the same       Past Medical History (prior to today's visit):  AMENORRHEA (ICD-626.0) (ICD10-N91.2)  HIDRADENITIS (ICD-705.83) (ICD10-L73.2)  DIABETES MELLITUS, TYPE II, 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) (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)  HYPERTENSION (ICD-401.9) (ICD10-I10)  SHORTNESS OF BREATH (ICD-786.05) (ICD10-R06.02)  ANNUAL EXAM (ICD-V72.31)  (ICD10-Z00.00)  DEPRESSION (ICD-311) (ICD10-F32.9)      ANXIETY (ICD-300.00) (ICD10-F41.9)  ABNORMAL PAP SMEAR 12/11 LSIL; COLPO 12/11; BX CIN I; 6/12 ASCUS PAP; 4/13 PAP NEG; HR HPV POS, NEG COLPO; 5/14 PAP/HR HPV NEG (ICD-795.00) (ICD10-R87.619)  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)  PATELLAR DISLOCATION-MEDIAL RETINACULAR TEAR; RIGHT KNEE (ICD-836.3) (ICD10-S83.006)  OBESITY (ICD-278.00) (ICD10-E66.9)  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)  SEXUALLY TRANSMITTED DISEASE, EXPOSURE TO-H/O GC, TRICH, HPV, CHLAMYDIA (ICD-V01.6) (ICD10-Z20.2)  S/P CHOLECYSTECTOMY (ICD-V45.79) (ICD10-Z90.49)           Medications (prior to today's visit):  LANTUS 100 UNIT/ML SUBCUTANEOUS SOLUTION (INSULIN GLARGINE) 48 units daily as directed  LISINOPRIL-HYDROCHLOROTHIAZIDE 20-12.5 MG ORAL TABLET (LISINOPRIL-HYDROCHLOROTHIAZIDE) one tablet daily  ATORVASTATIN CALCIUM 40 MG ORAL TABLET (ATORVASTATIN CALCIUM) Take one tablet by mouth once daily      * ACCUCHECK GLUCOMETER check sugars three times daily Dx: Diabetes type II with retinopathy E11.319      RELION ALL-IN-ONE DEVICE (BLOOD GLUC METER DISP-STRIPS) Use three times daily (whichever brand covered/with meter)      LANCETS (LANCETS) Use as directed.      INSULIN  SYRINGE 30G X 1/2" 0.5 ML (INSULIN SYRINGE-NEEDLE U-100)       BD DISP NEEDLES 30G X 1/2" (NEEDLE (DISP)) use small needle to inject insulin  NIFEDIPINE ER 60 MG ORAL TABLET EXTENDED RELEASE 24 HOUR (NIFEDIPINE) Take one tablet by mouth once daily  GNP PRENATAL VITAMINS 28-0.8 MG ORAL TABLET (PRENATAL VIT-FE FUMARATE-FA) take one by mouth daily    Medications (after today's visit):  LANTUS 100 UNIT/ML SUBCUTANEOUS SOLUTION (INSULIN GLARGINE) 48 units daily as directed  ATORVASTATIN CALCIUM 40 MG ORAL TABLET (ATORVASTATIN CALCIUM) Take one tablet by mouth once daily      * ACCUCHECK GLUCOMETER check sugars three times daily Dx: Diabetes type II with retinopathy E11.319      RELION ALL-IN-ONE DEVICE (BLOOD GLUC  METER DISP-STRIPS) Use three times daily (whichever brand covered/with meter)      LANCETS (LANCETS) Use as directed.      INSULIN SYRINGE 30G X 1/2" 0.5 ML (INSULIN SYRINGE-NEEDLE U-100)       BD DISP NEEDLES 30G X 1/2" (NEEDLE (DISP)) use small needle to inject insulin  NIFEDIPINE ER 60 MG ORAL TABLET EXTENDED RELEASE 24 HOUR (NIFEDIPINE) Take one tablet by mouth once daily  GNP PRENATAL VITAMINS 28-0.8 MG ORAL TABLET (PRENATAL VIT-FE FUMARATE-FA) take one by mouth daily           Allergies (prior to today's visit):  PENICILLIN V POTASSIUM (PENICILLIN V POTASSIUM) (Critical)                 Vitals:   Ht: 64.5 in.  Wt: 216.6 lbs.  BMI (in-lb) 36.74  Temp: 97.6deg F.     BP (Initial [place] Screening): 127 / 81     BP (Rechecked, Actionable): 127 / 81 mmHg   Pulse Rate: 97 bpm      Additional PE:   General: Well appearing  Head: NCAT  Gait: normal  Eyes-anicteric  Neck: supple  Psych: affect and mood appropriate        Assessment & Plan:   41 year old diabetic, htn and depression here for f/u pregnancy    Pregnancy   has decided to continue with pregnancy   spent over 1/2 of 25 min visit today in counselling (importance of htn, dm control in pregnancy, working on good decisionmaking overall) and coordination of care.     will book ob gyn today ?MFM pt with comorbidity       SOB: ddx includes CHF, CAD, lung disease (clear lungs on exam) post nasal drip and GERD, deconditionising ; tried flonase for pnd and zantac for gerd, no change . Sx stable x 6 months. Stress echo 6 METS only but normal (no ischemia, good echo, good fn and got dyspnic on the test, cxr neg; likely deconditioning      . DM;  off insulin last summe2017 , now back on and not checking sugars; financials are an issue       no lows,a1c 8.1  4/18   some lows now with eating healthy--cut down  lantus to 46 units;      eye exam 9/16; mild DR, needs f/u --she wants to go outside [place] and will book       changed to atorvastatin 40 mg (LDL 117) but hold for  now/pregnancy     refer to endocrine for pregnancy management      pt to check frequent bs to help guide, will likely need humalog with meals  2. 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 Monitoring with eye care,  book now       4. depression/reactivity/irritability  -stopped sertraline, currently improved           Likely PCOS (advised weight loss)  Likely hidradenitis-no active boils now; use antibacterial and soaks; consider cleocin gel if continued     pvax in future   last pap 5/15 (got result but not HPV results) She will check in gyn at [facility] when next is due            Orders (this visit):  [facility] [Ref-Endo]  [facility] [Ref-OB]  Chlamydia probe [CPT-86631]  Gonorrhea DNA Probe [CPT-87591]  RTC in 2 weeks [RTC-014]  Est Level 4 [CPT-99214]    Patient Care Plan      Immunization Worksheet 2016           Created By Shan Levans on 02/26/2017 at 11:25 AM    Electronically Signed By [name] MD on 02/26/2017 at 02:19 PM

## 2017-02-26 NOTE — Progress Notes (Signed)
General Medicine Visit  .  Service Due by Standard Protocol Rules: DIAB EYE EX..  Initial Screening   * Heidi: [name] (Heidi)  Ht: 64.5 in.  Wt: 216.6 lbs.   BMI: 36.74  Temp: 97.6 deg F.     BP (Initial [place] Screening): 127 / 81      BP (Rechecked, Actionable): 127 /   81 mmHg   HR: 97     Healthy Eating materials? Weight Education Handout for high BMI  Travel outside of the [place] in past 28 days:: No  HCP materials? Printed  Chronic Pain Assessment: Does pt experience chronic pain ? NO  Have you received a Flu shot recently (where)? Other  Smoking Assessment: Tobacco use? smoker-current status unknown  .  [name]  Self Management materials provided? Printed handout: Health Care Proxy and   Medical Decision Making  .  Med List: PRINTED by [place] for patient   Med List: PRINTED by [place] for patient   Patient consents for eRx History:  Paper  .  Falls Risk Assessment:   In the past year, have you ... Had no falls  Difficulty with balance? NO  Need assistance with ambulation while here? NO  Comments: Medication List given.  .  .  ......................................Marland KitchenNafisa [name]  February 26, 2017 11:25   AM  .  Basic Visit  Chief Complaint:   40 year old here for f/u   .  History of Present Illness:   Pregnant-decided to keep the baby   really wants to take care of herself and have a healthy baby   LMP end January    no alcohol   staying off  MJ   no pets   .  DM-now eating healthy-cut out snacks and sweets   may have had some AM lows but doesn't have a glucometer   some decrease in AM eating  .  [name]  HTN: switched to nifedipine; no dizzyness  .  .  .  .  .  .  .  .  .  [name]  lost 4 lbs mild AM  nausea  breathing about the same   .  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  .  .  Medications (prior to today's visit):  LANTUS 100 UNIT/ML SUBCUTANEOUS SOLUTION (INSULIN GLARGINE) 48 units daily   as directed  LISINOPRIL-HYDROCHLOROTHIAZIDE 20-12.5 MG ORAL TABLET (LISINOPRIL-HYDROCHLO  ROTHIAZIDE) one tablet daily  ATORVASTATIN CALCIUM 40 MG ORAL TABLET (ATORVASTATIN CALCIUM) Take one tabl  et by mouth once daily      * ACCUCHECK GLUCOMETER check sugars three times daily Dx: Diabetes type   II with retinopathy E11.319      RELION ALL-IN-ONE DEVICE (BLOOD GLUC METER DISP-STRIPS) Use three times   daily (whichever brand covered/with  meter)      LANCETS (LANCETS) Use as directed.      INSULIN SYRINGE 30G X 1/2" 0.5 ML (INSULIN SYRINGE-NEEDLE U-100)       BD DISP NEEDLES 30G X 1/2" (NEEDLE (DISP)) use small needle to inject i  nsulin  NIFEDIPINE ER 60 MG ORAL TABLET EXTENDED RELEASE 24 HOUR (NIFEDIPINE) Take   one tablet by mouth once daily  GNP PRENATAL VITAMINS 28-0.8 MG ORAL TABLET (PRENATAL VIT-FE FUMARATE-FA) t  ake one by mouth daily  .  Medications (after today's visit):  LANTUS 100 UNIT/ML SUBCUTANEOUS SOLUTION (INSULIN GLARGINE) 48 units daily   as directed  ATORVASTATIN CALCIUM 40 MG ORAL TABLET (ATORVASTATIN CALCIUM) Take one tabl  et by mouth once daily      * ACCUCHECK GLUCOMETER check sugars three times daily Dx: Diabetes type   II with retinopathy E11.319       RELION ALL-IN-ONE DEVICE (BLOOD GLUC METER DISP-STRIPS) Use three times   daily (whichever brand covered/with meter)      LANCETS (LANCETS) Use as directed.      INSULIN SYRINGE 30G X 1/2" 0.5 ML (INSULIN SYRINGE-NEEDLE U-100)       BD DISP NEEDLES 30G X 1/2" (NEEDLE (DISP)) use small needle to inject i  nsulin  NIFEDIPINE ER 60 MG ORAL TABLET EXTENDED RELEASE 24 HOUR (NIFEDIPINE) Take   one tablet by mouth once daily  GNP PRENATAL VITAMINS 28-0.8 MG ORAL TABLET (PRENATAL VIT-FE FUMARATE-FA) t  ake one by mouth daily  .  .  .  Allergies (prior to today's visit):  PENICILLIN V POTASSIUM (PENICILLIN V POTASSIUM) (Critical)  .  .  .  .  .  .  Vitals:   Ht: 64.5 in.  Wt: 216.6 lbs.  BMI (in-lb) 36.74  Temp: 97.6deg F.     BP (Initial [place] Screening): 127 / 81     BP (Rechecked, Actionable): 127 / 8  1 mmHg   Pulse Rate: 97 bpm  .  .  Additional PE:   General: Well appearing  Head: NCAT  Gait: normal  Eyes-anicteric  Neck: supple  Psych: affect and mood appropriate  .  .  .  Assessment /T/ Plan:   41 year old diabetic, htn and depression here for f/u pregnancy  .  Pregnancy   has decided to continue with pregnancy   spent over 1/2 of 25 min visit today in counselling (importance of htn, dm   control in pregnancy, working on good decisionmaking overall) and coordina  tion of care.  .   will book ob gyn today ?MFM pt with comorbidity   .  .  SOB: ddx includes CHF, CAD, lung disease (clear lungs on exam) post nasal d  rip and GERD, deconditionising ; tried flonase for pnd and zantac for gerd,   no change . Sx stable x 6 months. Stress echo 6 METS only but normal (no i  schemia, good echo, good fn and got dyspnic on the test, cxr neg; likely de  conditioning  .  .  . DM;  off insulin last summe2017 , now back on and not checking sugars; fi  nancials are an issue    no lows,a1c 8.1  4/18   some lows now with eating healthy--cut down  lant  [place] to 46 units;      eye exam 9/16; mild DR, needs f/u --she wants to go outside [place]  and w  ill book       changed  to atorvastatin 40 mg (LDL 117) but hold for now/pregnancy     refer to endocrine for pregnancy management      pt to check frequent bs to help guide, will likely need humalog with me  als  .  .  .  .  2. bp controlled; change to nifedipine for pregnancy  .  .  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 Monitoring wit  h eye care,  book now   .  [name]  4. depression/reactivity/irritability  -stopped sertraline, currently impro  ved   .  .  .  .  Likely PCOS (advised weight loss)  Likely hidradenitis-no active boils now; use antibacterial and soaks; consi  der cleocin gel if continued  pvax in future   last pap 5/15 (got result but not HPV results) She will check [facility] gyn [facility] when next is due  .  .  .  Orders (this visit):  [facility] [Ref-Endo]  [facility] [Ref-OB]  Chlamydia probe [CPT-86631]  Gonorrhea DNA Probe [CPT-87591]  RTC in 2 weeks [RTC-014]  Est Level 4 [CPT-99214]  .  Patient Care Plan  .  [name]  Immunization Worksheet 2016   .  .  [name]  Electronically Signed by [name], MD on 02/26/2017 at 2:19 PM  ________________________________________________________________________

## 2017-02-27 LAB — HX MOLECULAR BIO
HX CHLAMYDIA DNA PROBE: NEGATIVE
HX CHLAMYDIA DNA PROBE: NEGATIVE
HX GC DNA PROBE: NEGATIVE
HX GC DNA PROBE: NEGATIVE

## 2017-02-27 LAB — HX CHLAMYDIA: HX CHLAMYDIA DNA PROBE: NEGATIVE

## 2017-03-01 ENCOUNTER — Ambulatory Visit

## 2017-03-02 ENCOUNTER — Ambulatory Visit

## 2017-03-02 NOTE — Progress Notes (Signed)
[facility] March 02, 2017  [address]   [place]  [place] [address]  Main: [phone]  Fax: [phone]  Patient Portal: [email or web address]  [email or web address]  [place], [place]  [address]                                MR#: [id number]                                         DOB:1976-05-29    Dear  Ms. Murdock,      The results of the tests performed during your visit are as follows:    Chlamydia = Negative    Gonorrhea = Negative    Please call me if you have any questions.        Sincerely,      [name], MD  [facility]  [phone]      ** Please be aware of our new extended hours to better care for you. Hours are: Monday through Thursday from 8 am to 8 pm; Friday from 8 am to 5 pm; Saturday from 8 am to 12 noon.        Created By Coralyn Pear on 03/02/2017 at 04:23 PM    Electronically Signed By [name] MD on 03/05/2017 at 03:14 PM

## 2017-03-12 ENCOUNTER — Ambulatory Visit: Admitting: Internal Medicine

## 2017-03-12 ENCOUNTER — Ambulatory Visit

## 2017-03-12 MED ORDER — GLUCOSE BLOOD: 100 | 1 refills | 0 days | Status: AC

## 2017-03-12 NOTE — Telephone Encounter (Signed)
Call Details:   Patient PCP = [name], MD  [name] (Patient) called on Mar 12, 2017 9:43 AM.  Message taken by: Coralyn Pear  Primary call-back number: [phone]    Secondary call-back number: () -    Call Reason(s): Prescription        ** REQUESTING A PRESCRIPTION OR REFILL.  Preferred Pharmacy:        Phone:   Fax:     Provide Prescription to Pharmacy  Medications to refill:  Relion all-in-one device : Use three times daily (whichever brand covered/with meter)    ---------- ---------- ---------- ---------- ---------- ----------     ---------- ---------- ---------- ---------- ---------- ----------     CALL RESPONSE:  Patient had an appointment this morning with JT but had to leave before being seen-- I rescheduled followup for 5/15 at 2:20pm. [name] asked if her Accu-Check Aviva Plus Test Strips could be re-filled and sent to the Pharmacy listed below:    [facility]  [address] [place], [place]  [address]     Phone: [phone]    Fax: [phone]    Prescriptions:  ACCU-CHEK AVIVA IN VITRO STRIP (GLUCOSE BLOOD) use 1-3 times a day   dx E11 .319  #100[Strip] x 1   Entered by: Maris Berger, RN   Authorized by: [name], MD   Signed by: Maris Berger, RN on 03/12/2017   Method used: Electronically to      [facility] (ret[facility]     [address] [place], [place]  [address]     Ph: [phone]     Fax: [phone]   RxID: [id number]      RESPONSE/ORDERS:    faxed. 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)  HYPERTENSION (ICD-401.9) (ICD10-I10)  SHORTNESS OF BREATH (ICD-786.05) (ICD10-R06.02)  ANNUAL EXAM (ICD-V72.31) (ICD10-Z00.00)  DEPRESSION (ICD-311) (ICD10-F32.9)      ANXIETY (ICD-300.00)  (ICD10-F41.9)  ABNORMAL PAP SMEAR 12/11 LSIL; COLPO 12/11; BX CIN I; 6/12 ASCUS PAP; 4/13 PAP NEG; HR HPV POS, NEG COLPO; 5/14 PAP/HR HPV NEG (ICD-795.00) (ICD10-R87.619)  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)  PATELLAR DISLOCATION-MEDIAL RETINACULAR TEAR; RIGHT KNEE (ICD-836.3) (ICD10-S83.006)  OBESITY (ICD-278.00) (ICD10-E66.9)  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)  SEXUALLY TRANSMITTED DISEASE, EXPOSURE TO-H/O GC, TRICH, HPV, CHLAMYDIA (ICD-V01.6) (ICD10-Z20.2)  S/P CHOLECYSTECTOMY (ICD-V45.79) (ICD10-Z90.49)    Allergies:   PENICILLIN V POTASSIUM (PENICILLIN V POTASSIUM) (Critical)    Meds (prior to this call):   LANTUS 100 UNIT/ML SUBCUTANEOUS SOLUTION (INSULIN GLARGINE) 48 units daily as directed  ATORVASTATIN CALCIUM 40 MG ORAL TABLET (ATORVASTATIN CALCIUM) Take one tablet by mouth once daily      * ACCUCHECK GLUCOMETER check sugars three times daily Dx: Diabetes type II with retinopathy E11.319      RELION ALL-IN-ONE DEVICE (BLOOD GLUC METER DISP-STRIPS) Use three times daily (whichever brand covered/with meter)      LANCETS (LANCETS) Use as directed.      INSULIN SYRINGE 30G X 1/2" 0.5 ML (INSULIN SYRINGE-NEEDLE U-100)       BD DISP NEEDLES 30G X 1/2" (NEEDLE (DISP)) use small needle to inject insulin  NIFEDIPINE ER 60 MG ORAL TABLET  EXTENDED RELEASE 24 HOUR (NIFEDIPINE) Take one tablet by mouth once daily  GNP PRENATAL VITAMINS 28-0.8 MG ORAL TABLET (PRENATAL VIT-FE FUMARATE-FA) take one by mouth daily    Changes to Meds (this update):   Added new medication of ACCU-CHEK AVIVA IN VITRO STRIP (GLUCOSE BLOOD) use 1-3 times a day   dx E11 .319 - Signed  Rx of ACCU-CHEK AVIVA IN VITRO STRIP (GLUCOSE BLOOD) use 1-3 times a day   dx E11 .319;  #100[Strip] x 1;  Signed;  Entered by: Maris Berger, RN;  Authorized by: [name], MD;  Method used: Electronically to [facility] [address], [place], [place]  [address], Ph: [phone], Fax: [phone]        Created By Coralyn Pear  on 03/12/2017 at 09:43 AM    Electronically Signed By [name] MD on 03/12/2017 at 11:57 AM

## 2017-03-15 ENCOUNTER — Ambulatory Visit: Admitting: Maternal & Fetal Medicine

## 2017-03-15 ENCOUNTER — Ambulatory Visit: Admit: 2017-03-15 | Payer: HMO

## 2017-03-20 ENCOUNTER — Ambulatory Visit: Admitting: Maternal & Fetal Medicine

## 2017-03-20 ENCOUNTER — Ambulatory Visit

## 2017-03-20 ENCOUNTER — Ambulatory Visit: Admit: 2017-03-20 | Payer: HMO

## 2017-03-20 LAB — HX HEM-ROUTINE
HX BASO #: 0 10*3/uL (ref 0.0–0.2)
HX BASO: 0 %
HX EOSIN #: 0.1 10*3/uL (ref 0.0–0.5)
HX EOSIN: 1 %
HX HCT: 31.3 % — ABNORMAL LOW (ref 32.0–45.0)
HX HGB: 10.9 g/dL — ABNORMAL LOW (ref 11.0–15.0)
HX IMMATURE GRANULOCYTE#: 0 10*3/uL (ref 0.0–0.1)
HX IMMATURE GRANULOCYTE: 0 %
HX LYMPH #: 2.2 10*3/uL (ref 1.0–4.0)
HX LYMPH: 32 %
HX MCH: 30.8 pg (ref 26.0–34.0)
HX MCHC: 34.8 g/dL (ref 32.0–36.0)
HX MCV: 88.4 fL (ref 80.0–98.0)
HX MONO #: 0.4 10*3/uL (ref 0.2–0.8)
HX MONO: 6 %
HX MPV: 9.1 fL (ref 9.1–11.7)
HX NEUT #: 4.2 10*3/uL (ref 1.5–7.5)
HX PLT: 379 10*3/uL (ref 150–400)
HX RBC BLOOD COUNT: 3.54 M/uL — ABNORMAL LOW (ref 3.70–5.00)
HX RDW: 14.6 % — ABNORMAL HIGH (ref 11.5–14.5)
HX SEG NEUT: 61 %
HX WBC: 7 10*3/uL (ref 4.0–11.0)

## 2017-03-20 LAB — HX TRANSFUSION
HX ABO-RH INTERPRETATION (GEL): A NEG
HX ANTIBODY SCREEN (GEL): NEGATIVE

## 2017-03-20 LAB — HX HEM-SPECIAL
HX HGB A2: 3.5 % (ref 1.5–3.5)
HX HGB F: 1.1 % (ref 0.7–2.0)

## 2017-03-20 MED ORDER — Aspirin Low Dose: 81 | 30 | Freq: Every day | 5 refills | 0 days | Status: AC

## 2017-03-20 NOTE — Progress Notes (Signed)
**Progress Notes**    ---    **Patient:** [name], ROMAAccount Number:** [id number] **External MRN:** [id number]   **Provider:** Elnora Morrison, MD     **DOB:** 11-Jul-1976 **Age:** 41 Y **Sex:** Female   **Date:** 03/20/2017     **Phone:** [phone]   **CHN#:** [id number]     **Address:** [address], [place], [place]     **Pcp:** [name]        * * *         **Subjective:**        ---       **Chief Complaints:**    --- ---       1\. AMA,HYPERTENSION AND DIABETES. 41 yo G2P0010 w/ LMP 12/05/16, EDC  09/11/17 EGA [redacted] weeks for NOB visit due to [facility], DM, AMA.    --- ---      **HPI:**    _GENERAL_ :    Hx of CHTN since age late 21s. Was on Lisinopril until early April of 2018  when switched due pregnancy. Primary Care MD Eual Fines in [facility].    No hx of cardiac or renal complications. Fam hx of HTN (mom).    Diagnosed with diabetes in early 20s, and was first treated with oral agents.  Switched to insulin sometime in last few years. Checks BSs a couple of times a  day (now). States she has had issues with her eyes related to DM - has not  required surgery. Sees ophtho tomorrow. Has not seen a nutritionist in this  pregnancy. Reports numbness in hands, but not feet.    HbA1C previously 9. Most recent 8.1%.    --- ---       **ROS:**    _MFM Review of Systems_ :    Vaginal bleeding, leakage of fluid from the vagina, cramping No. Fevers,  weakness, memory loss, swollen glands, easy bruising, weight loss, constant  fatigue No. Visual problems, blurred vision, hearing difficulty No, except hx  of blurred vision - followed ophtho. Changing mole, skin rash, jaundice No.  Chest pains, palpitations, dizziness, heart murmurs No. Shortness of breath,  cough, wheezing No, except SOB when active. Nausea, vomiting, loss of  appetite, constipation, diarrhea No, except mild constipation. Painful  urination, leakage of urine, frequent urination, blood in urine No. Joint  swelling, joint stiffness, pain in legs No.  Breast lumps, breast pain, nipple  discharge No. Headaches, numbness, weakness, seizures No, except mild HA - ?  related to new medications. Depression, anxiety No. Physical or emotional  abuse No. Reviewed with patient Yes.        --- ---      **Medical History:** Diabetes , Chronic hypertension, Seizures as a child,  Pregnancy: yes.        --- ---      **Gyn History:** Hx Abnormal pap smear yes, with biopsy in past but no  treatment. Last pap 2015.. Menstrual History LMP 12/05/2016. STD  HPV/condyloma, HSV/Herpes - outbreaks rare. Pregnancy #1 Date- /2006, Del-  TAB, Comments- No complications.    --- ---      **OB History:** Previous Pregnancies Total Pregnancies: 1, Full Term: 0,  Premature: 0, AB. Induced: 0, AB. Spontaneous: 0, Ectopics: 0, Multiple  Births: 0, Living: 0\.    --- ---      **Surgical History:** lap cholecystectomy 2010.    --- ---      **Hospitalization/Major Diagnostic Procedure:** Denies Past Hospitalization.    --- ---      **  Family History:**    Parnter is 70 and is [place]. No fam hx of congen anomalies, MR, stillbirth,  sickle cell disease.    --- ---       **Social History:** Tobacco history: Formerly smoked quits when found out  pregnant. Marital Status  Single. Work/Occupation: employed full-time doind  Therapist, sports. Alcohol  Denies. Illicit drugs: Used marijuana 3-4 x per day  until pregnant then stopped. Lives with: Alone.    --- ---      **Medications:** Taking Lantus 40u daily, Taking NIFEdipine 60mg  daily,  Taking Prenatal    --- ---      **Allergies:** Penicillin: rash.    --- ---        **Objective:**        ---       **Vitals:** Pain scale 0/10, Ht-in 5'6", Wt-lbs 216, BMI 34.86, BP 144/86,  159/122, BP UE 159/144, BP LE 122/86.    --- ---       **Physical Examination:**    _OB/Gyn General_ :    General Appearance: well-appearing, no acute distress. Mental Status: alert  and oriented. Mood/Affect: pleasant.    _OB/Gyn HEENT_ :    Head ATNC. Eyes PERRLA.    _OB/Gyn Neck_ :     Thyroid: unremarkable. Neck Mass: none.    _OB/Gyn Chest/Thorax_ :    Breath sounds: clear to auscultation. Respiratory Effort normal.    _OB/Gyn Heart_ :    Rhythm: regular. Murmurs: none. Rate: normal.    _OB/Gyn Abdomen_ :    General: soft. Tenderness: nontender. Fundal Height not palpable. Fetal Heart  Tones present.    _OB/Gyn Skin_ :    General: warm, moist, no rash.    _OB/Gyn Extremities_ :    Edema: no clubbing, cyanosis, edema.        --- ---        **Assessment:**        ---       **Assessment:**    1\. Elderly multigravida in second trimester - O09.522 (Primary)    2\. Medication exposure during first trimester of pregnancy - O09.891    3\. Maternal chronic hypertension in second trimester - O10.912    4\. Diabetes mellitus affecting pregnancy in second trimester - O24.912    5\. Herpesviral infection, unspecified - B00.9    6\. Other viral diseases complicating pregnancy, unspecified trimester -  O98.519    --- ---        **Plan:**        ---         **1\. Elderly multigravida in second trimester**    Notes: Discussed age related risk of Down syndrome and all chromosomal  abnormalities. Discussed options for screening and diagnostic testing. Pt  opted to have NIPT today.    Discussed risks of pregnancy after age 40, including the increased risk of CD,  stillbirth and DM/HTN disorders. ,Advanced Maternal Age: Care Instructions  material was printed.    ---    **2\. Medication exposure during first trimester of pregnancy**    Notes: Patient was on lisinopril and statin in early pregnancy. Now on  nifedipine. Targeted anatomic survey at 18-19 weeks planned.    **3\. Maternal chronic hypertension in second trimester**    Start Aspirin Low Dose Tablet Chewable, 81 MG, 1 tablet, Orally, Once a day,  30 day(s), 30, Refills 5 .    Notes: Discussed importance of BP control (forgot to take medicine this a.m.),  and risks of CHTN, including PEC, IUGR,  abruption, early (indicated) delivery,  and stillbirth.  Recommended low dose ASA to reduce the risk of severe or early  onset PEC.    **4\. Diabetes mellitus affecting pregnancy in second trimester**    Notes: Discussed need for tight control and target goals for BSs. Instructed  on fasting and postprandial blood sugars and Flow sheets given. Recommended  nutrition consult. Reviewed maternal and fetal risks of DM in pregnancy, and  risk of fetal anomaly with HbA1c of 8%. No adjustments to insulin today.    Recommended 'early' anatomy next week then targeted scan at 18-19 and fetal  echo at 20 weeks.    **5\. Herpesviral infection, unspecified**    Notes: Pt very concerned re: hx of HSV. Explained we recommend prophylaxis few  weeks before delivery to decrease the change CD would be needed for HSV  outbreak.    **6\. Others**    Continue NIFEdipine, 60mg , daily ; Continue Prenatal ; Continue Lantus, 40u,  daily .    Notes: Discussed practice setup and contact info. ACOG info on DM, CHTN, and  AMA given.      **Follow Up:** 1 Week    --- ---    ---    ---          **Provider:** Elnora Morrison, MD    ---     **Patient:** Rotundo, Glennis **DOB:** 09-30-1976 **Date:** 03/20/2017    ---    Electronically signed by Marlowe Kays , MD on 03/20/2017 at 06:25 PM EDT    Sign off status: Completed

## 2017-03-20 NOTE — Progress Notes (Signed)
.  Progress Notes  .  Patient: [name], [name]  Provider: Marlowe Kays D   .  DOB: Jun 08, 1976 Age: 41 Y Sex: Female  .  PCP: [name]   Date: 03/20/2017  .  --------------------------------------------------------------------------------  .  REASON FOR APPOINTMENT  .  1. AMA,HYPERTENSION AND DIABETES. 41 yo G2P0010 w/ LMP 12/05/16,  EDC 09/11/17 EGA [redacted] weeks for NOB visit due to [facility], DM, AMA.  [name]  HISTORY OF PRESENT ILLNESS  .  GENERAL:   Hx of CHTN since age late 24s. Was on Lisinopril until early  April of 2018 when switched due pregnancy. Primary Care MD Eual Fines in [facility]. No hx of cardiac or renal complications. Fam hx  of HTN (mom). Diagnosed with diabetes in early 20s, and was first  treated with oral agents. Switched to insulin sometime in last  few years. Checks BSs a couple of times a day (now). States she  has had issues with her eyes related to DM - has not required  surgery. Sees ophtho tomorrow. Has not seen a nutritionist in  this pregnancy. Reports numbness in hands, but not feet.HbA1C  previously 9. Most recent 8.1%.  .  CURRENT MEDICATIONS  .  Taking Lantus 40u daily, Taking NIFEdipine 60mg  daily, Taking  Prenatal  .  PAST MEDICAL HISTORY  .  Diabetes , Chronic hypertension, Seizures as a child, Pregnancy:  yes.  .  ALLERGIES  .  Penicillin: rash.  .  SURGICAL HISTORY  .  lap cholecystectomy 2010.  [name]  FAMILY HISTORY  .  Parnter is 2 and is [place]. No fam hx of congen anomalies, MR,  stillbirth, sickle cell disease.  .  SOCIAL HISTORY  .  .  Tobaccohistory:Formerly smoked quits when found out pregnant.  .  Marital Status Single.  .  Work/Occupation: employed full-time doind Therapist, sports.  .  Alcohol Denies.  .  Illicit drugs: Used marijuana 3-4 x per day until pregnant then  stopped.  .  Lives with: Alone.  .  GYN HISTORY  .  Hx Abnormal pap smear yes, with biopsy in past but no treatment.  Last pap 2015.  Menstrual History LMP 12/05/2016  STD HPV/condyloma, HSV/Herpes - outbreaks  rare  Pregnancy #1 Date- /2006, Del- TAB, Comments- No complications  .  OB HISTORY  .  Previous Pregnancies Total Pregnancies: 1, Full Term: 0,  Premature: 0, AB. Induced: 0, AB. Spontaneous: 0, Ectopics: 0,  Multiple Births: 0, Living: 0  .  HOSPITALIZATION/MAJOR DIAGNOSTIC PROCEDURE  .  Denies Past Hospitalization.  [name]  REVIEW OF SYSTEMS  .  MFM Review of Systems:  .  Vaginal bleeding, leakage of fluid from the vagina, cramping     No . Fevers, weakness, memory loss, swollen glands, easy  bruising, weight loss, constant fatigue    No . Visual problems,  blurred vision, hearing difficulty    No, except hx of blurred  vision - followed ophtho . Changing mole, skin rash, jaundice     No . Chest pains, palpitations, dizziness, heart murmurs    No .  Shortness of breath, cough, wheezing    No, except SOB when  active . Nausea, vomiting, loss of appetite, constipation,  diarrhea    No, except mild constipation . Painful urination,  leakage of urine, frequent urination, blood in urine    No .  Joint swelling, joint stiffness, pain in legs    No . Breast  lumps, breast pain, nipple discharge  No . Headaches, numbness,  weakness, seizures    No, except mild HA - ? related to new  medications . Depression, anxiety    No . Physical or emotional  abuse    No . Reviewed with patient    Yes .  [name]  VITAL SIGNS  .  Pain scale 0/10, Ht-in 5'6", Wt-lbs 216, BMI 34.86, BP 144/86,  159/122, BP UE 159/144, BP LE 122/86.  [name]  PHYSICAL EXAMINATION  .  OB/Gyn General:  General Appearance:  well-appearing, no acute distress. Mental  Status:  alert and oriented. Mood/Affect:  pleasant.  OB/Gyn HEENT:  Head  ATNC. Eyes  PERRLA.  OB/Gyn Neck:  Thyroid:  unremarkable. Neck Mass:  none.  OB/Gyn Chest/Thorax:  Breath sounds:  clear to auscultation. Respiratory Effort   normal.  OB/Gyn Heart:  Rhythm:  regular. Murmurs:  none. Rate:  normal.  OB/Gyn Abdomen:  General:  soft. Tenderness:  nontender. Fundal Height  not  palpable. Fetal Heart Tones   present.  OB/Gyn Skin:  General:  warm, moist, no rash.  OB/Gyn Extremities :  Edema:  no clubbing, cyanosis, edema.  .  ASSESSMENTS  .  Elderly multigravida in second trimester - O09.522 (Primary)  .  Medication exposure during first trimester of pregnancy - O09.891  .  Maternal chronic hypertension in second trimester - O10.912  .  Diabetes mellitus affecting pregnancy in second trimester -  O24.912  .  Herpesviral infection, unspecified - B00.9  .  Other viral diseases complicating pregnancy, unspecified  trimester - O98.519  .  TREATMENT  .  Elderly multigravida in second trimester  Notes: Discussed age related risk of Down syndrome and all  chromosomal abnormalities. Discussed options for screening and  diagnostic testing. Pt opted to have NIPT today. Discussed risks  of pregnancy after age 16, including the increased risk of CD,  stillbirth and DM/HTN disorders. ,Advanced Maternal Age: Care  Instructions material was printed.  .  .  Medication exposure during first trimester of  pregnancy  Notes: Patient was on lisinopril and statin in early pregnancy.  Now on nifedipine. Targeted anatomic survey at 18-19 weeks  planned.  .  .  Maternal chronic hypertension in second trimester  Start Aspirin Low Dose Tablet Chewable, 81 MG, 1 tablet, Orally,  Once a day, 30 day(s), 30, Refills 5  Notes: Discussed importance of BP control (forgot to take  medicine this a.m.), and risks of CHTN, including PEC, IUGR,  abruption, early (indicated) delivery, and stillbirth.  Recommended low dose ASA to reduce the risk of severe or early  onset PEC.  .  .  Diabetes mellitus affecting pregnancy in second  trimester  Notes: Discussed need for tight control and target goals for BSs.  Instructed on fasting and postprandial blood sugars and Flow  sheets given. Recommended nutrition consult. Reviewed maternal  and fetal risks of DM in pregnancy, and risk of fetal anomaly  with HbA1c of 8%. No adjustments to insulin today.  Recommended  'early' anatomy next week then targeted scan at 18-19 and fetal  echo at 20 weeks.  .  .  Herpesviral infection, unspecified  Notes: Pt very concerned re: hx of HSV. Explained we recommend  prophylaxis few weeks before delivery to decrease the change CD  would be needed for HSV outbreak.  .  .  Others  Continue NIFEdipine, 60mg , daily  Continue Prenatal  Continue Lantus, 40u, daily  Notes: Discussed practice setup and contact info. ACOG info on  DM, CHTN, and AMA given.  .  FOLLOW UP  .  1 Week  .  Electronically signed by Marlowe Kays , MD on  03/20/2017 at 06:25 PM EDT  .  Document electronically signed by Marlowe Kays D   .

## 2017-03-21 ENCOUNTER — Ambulatory Visit: Admitting: Maternal & Fetal Medicine

## 2017-03-21 NOTE — Progress Notes (Signed)
* * *        **  [name]**    --- ---    69 Y old Female, DOB: 06/07/1976    [address], [place], [place] [address]    Home: [phone]    Provider: Marlowe Kays D        * * *    Telephone Encounter    ---    Answered by   Curt Jews  Date: 03/21/2017         Time: 06:53 PM    Caller   pt    --- ---            Reason   pt confirmed all appt 5.22.18            Message                      pt will attend [place] on 03/27/17 at 1:00pm, clinic mfm 2:30 pm and nutriction at 3:30 pm                    * * *                ---          * * *          Patient: [name] DOB: July 29, 1976 Provider: Marlowe Kays D  03/21/2017    ---    Note generated by eClinicalWorks EMR/PM Software ([email or web address])

## 2017-03-21 NOTE — Progress Notes (Signed)
* * *        **  [name]**    --- ---    24 Y old Female, DOB: 1976/02/17    [address], [place], [place] [address]    Home: [phone]    Provider: Marlowe Kays D        * * *    Telephone Encounter    ---    Answered by   Curt Jews  Date: 03/21/2017         Time: 06:26 PM    Caller   ME    --- ---            Reason   give [place] nut mfm appt 03/27/17            Message                      lvm that [place] on 03/27/17 is now scheduled at 1:00 pm, mfm clinic at 2:30 pm, and nutrition at 3:30 pm. pt was left instructions to call and confirm these appts                    * * *                ---          * * *          Patient: [name] DOB: 1976-03-31 Provider: Marlowe Kays D  03/21/2017    ---    Note generated by eClinicalWorks EMR/PM Software ([email or web address])

## 2017-03-27 ENCOUNTER — Ambulatory Visit: Admitting: Maternal & Fetal Medicine

## 2017-03-27 ENCOUNTER — Ambulatory Visit

## 2017-03-27 ENCOUNTER — Ambulatory Visit: Admitting: Registered"

## 2017-03-27 ENCOUNTER — Ambulatory Visit: Admit: 2017-03-27 | Payer: HMO

## 2017-03-27 LAB — HX CHEM-OTHER
HX % IRON SATURATION: 16 % (ref 15–40)
HX FERRITIN: 71 ng/mL (ref 10–240)
HX IRON: 74 ug/dL (ref 37–170)
HX TOTAL IRON BINDING CAPACITY: 452 ug/dL (ref 253–463)
HX TRANSFERRIN: 323 mg/dL (ref 181–331)

## 2017-03-27 NOTE — Progress Notes (Signed)
**Progress Notes**    ---    **Patient:** [name], FOLDENAccount Number:** [id number] **External MRN:** [id number]   **Provider:** Elnora Morrison, MD     **DOB:** 12-02-1975 **Age:** 43 Y **Sex:** Female   **Date:** 03/27/2017     **Phone:** [phone]   **CHN#:** [id number]     **Address:** [address], [place], [place]     **Pcp:** [name]        * * *         **Subjective:**        ---       **Chief Complaints:**    --- ---         --- ---      **Medical History:** Diabetes , Chronic hypertension, Seizures as a child,  Pregnancy: yes.        --- ---      **Gyn History:** Hx Abnormal pap smear yes, with biopsy in past but no  treatment. Last pap 2015.. Menstrual History LMP 12/05/2016. STD  HPV/condyloma, HSV/Herpes - outbreaks rare. Pregnancy #1 Date- /2006, Del-  TAB, Comments- No complications.    --- ---      **OB History:** Previous Pregnancies Total Pregnancies: 1, Full Term: 0,  Premature: 0, AB. Induced: 0, AB. Spontaneous: 0, Ectopics: 0, Multiple  Births: 0, Living: 0\.    --- ---      **Social History:** Tobacco history: Formerly smoked quits when found out  pregnant. Marital Status  Single. Work/Occupation: employed full-time doind  Therapist, sports. Alcohol  Denies. Illicit drugs: Used marijuana 3-4 x per day  until pregnant then stopped. Lives with: Alone.    --- ---      **Medications:** Taking Aspirin Low Dose 81 MG Tablet Chewable 1 tablet  Orally Once a day, Taking Lantus 40u daily, Taking NIFEdipine 60mg  daily,  Taking Prenatal , Medication List reviewed and reconciled with the patient    --- ---       **Allergies:** Penicillin: rash.    --- ---        **Objective:**        ---       **Vitals:** Pain scale 0/10, Wt-lbs 212, BP UE 136, BP LE 73.    --- ---         **Assessment:**        ---       **Assessment:**    1\. Maternal chronic hypertension in second trimester - O10.912 (Primary)    2\. Other viral diseases complicating pregnancy, unspecified trimester -  O98.519    3\.  Diabetes mellitus affecting pregnancy in second trimester - O24.912    4\. Medication exposure during first trimester of pregnancy - O09.891    5\. Elderly multigravida in second trimester - O09.522    --- ---        **Plan:**        ---         **1\. Maternal chronic hypertension in second trimester**    Continue Aspirin Low Dose Tablet Chewable, 81 MG, 1 tablet, Orally, Once a day  .    ---    **2\. Others**    Continue NIFEdipine, 60mg , daily ; Continue Prenatal ; Continue Lantus, 40u,  daily .      **Procedure Codes:** [id number] PRENATAL VISIT    --- ---       **Follow Up:** 1 Week    --- ---    ---    ---          **  Provider:** Elnora Morrison, MD    ---     **Patient:** [name], [name] **DOB:** 1976/02/02 **Date:** 03/27/2017    ---    Electronically signed by Marlowe Kays , MD on 04/03/2017 at 11:32 AM EDT    Sign off status: Completed

## 2017-03-27 NOTE — Progress Notes (Signed)
.  Progress Notes  .  Patient: [name]  Provider: Jake Shark  DOB: 04-01-1976 Age: 41 Y Sex: Female  .  PCP: [name]   Date: 03/27/2017  .  --------------------------------------------------------------------------------  .  REASON FOR APPOINTMENT  .  1. pregestational DM  .  HISTORY OF PRESENT ILLNESS  .  General Nutrition:  SUBJECTIVE:  41 Y/F with T2DM, obesity, hypertension referred from MFM (Dr.  [name] Aris) for diabetes affecting pregnancy. G1 P0; EDD 09/11/2017;  [redacted] weeks gestation today. DM medication: Lantus insulin (40  units/day in morning). "I am a diabetic and we are trying to  maintain my sugar levels. I want the baby to be as healthy as  possible." Doesn't add salt; looks at labels for sodium now.  PREVIOUS MNT/WHEN/WHERE:  MNT for diabetes <10 years ago; DM diagnosed age 32.  EDUCATION:  college.  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  LIVING ARRANGEMENTS:  lives alone.  WEIGHT HISTORY:  220-225 lbs pre-pregnancy.  CURRENT MEAL PLAN:  3 meals + 3 snacks.  .  Pertinent Labs:   02/13/17 A1c, POC 8.1 H2/05/18 A1c, POC 8.6 H10/10/17 A1c, POC  9.6 H.  .  CURRENT MEDICATIONS  .  Taking Aspirin Low Dose 81 MG Tablet Chewable 1 tablet Orally  Once a day  Taking Lantus 42u daily  Taking NIFEdipine 60mg  daily  Taking Prenatal  .  PAST MEDICAL HISTORY  .  Diabetes  Chronic hypertension  Seizures as a child  .  ALLERGIES  .  Penicillin: rash  .  VITAL SIGNS  .  Ht-in 66, Wt-lbs 212, BMI 34.21, Ht-cm 167.64, Wt-kg 96.16*Wt is  from earlier today in MFM.  [name]  ASSESSMENTS  .  Diabetes mellitus affecting pregnancy in second trimester -  O24.912 (Primary)  .  Maternal chronic hypertension in second trimester - O10.912  .  Pre-pregnancy Weight: 220-225 lbs (stated)Pre-pregnancy BMI:  35.5Pregnancy weight gain: 4 lbs loss/1 week between 15 and [redacted]  weeks gestation!REE ([address] [place]): [phone]  kcals/dayKcals/day for second/third trimester pregnancy: REE x  1.3 = 2189 + 350 =  2539/2189 + 452 = 2641 Protein needs (1.1g/kg  mid-pregnancy): 110 g/dayCarbohydrate recommendations: minimum  175 g/day: 45/(15-30)/45/(15-30)/45/(15-30) in 3 meals + 3  snacksSodium recommendations: <2000 mg/dayNUTRITION DIAGNOSIS:  Food- and nutrition-related knowledge deficit; related to lack of  recent exposure to MNT for T2DD and no previous exposure to MNT  for pregestational DM; as evidenced by pt report and pregnancy.  .  TREATMENT  .  Others  Notes: INTERVENTION:*Provided positive reinforcement for SMBG,  reading labels for sodium, 3 meals/day pattern.*Medical Nutrition  Therapy (MNT)/Nutrition Education/Nutrition Counseling details:  Explained: rationale for lower sodium in a setting of HTN, lower  sodium seasoning suggestions; <2000 mg sodium/day. Explained:  T2DM and T2DM during pregnancy (pregestational diabetes); insulin  resistance; insulin requirements during pregnancy; relationship  between food (especially carbohydrate), insulin, pregnancy  hormones, and blood glucose (BG); T2DM management during  pregnancy [food choices + exercise + medication (Lantus insulin)]  and rationale for recommendations; SMBG goals of <90 fasting and  <140 post-prandial (1 hour), <120 (2 hours) timed from first bite  of meal (per ADA recommendation), and rationale for lower SMBG  goals in pregnancy; effects of carbohydrate-, protein-, and  fat-containing foods on BG and food examples of each; importance  of consistent carbohydrate intake; importance of including fiber,  protein, and fat with carbohydrate at meals (balance);  appropriate meal and  snack spacing; carbohydrate counting (carb  choices and grams, using diabetes plate method and My Gestational  Diabetes Meal Plan education materials); nutrition label reading;  rationale for avoiding juice and sugary beverages for T2DM  pregnancy; possible avoidance of fruit in the morning (breakfast  and mid-morning snack) when insulin resistance is higher (if  post-meal BGs  remain high); appropriate portions and balance of  carbohydrate-, protein-, and fat-containing foods at meals  according to the diabetes breakfast equation and diabetes plate  method, snack examples according to the healthy snack equation;  meal plan with carbohydrate distribution of 45 or  30/(15-30)/45/(15-30)/45/(15-30) (3 meals + 3 snacks). Encouraged  snacks to prevent hypoglycemia, especially evening snack to  prevent ketones overnight; recommended no more than 10 hours  between evening food and breakfast the next morning. Explained  method of action of basal insulin. *Individualized  recommendations:1. Try Mrs. Dash seasoning blends (sodium-free)  instead of seasoned salt.2. Look at Nutrition labels for sodium,  especially on frozen entrees. 500 mg for a whole dinner is okay.  Goal for day is <2000 mg/day. That's why sausage @ 1200  mg/serving is too high.3. Aim for 3 carb choices (45 g  carbohydrate) per meal within a balanced meal. You may need to  decrease to 2 carb choics at breakfast if after meal blood  glucose is >140. Aim for 1-2 carb choices at each of 3 snacks.4.  Let your blood sugar be your guide as to how your body handles a  particular meal.5. Drink water or seltzer instead of cranberry  juice or ginger-ale, as 4 oz of either would correct a low blood  glucose within 10 minutes.*Educational materials provided:  Diabetes Management booklet (2016), Breakfast Equation for  Diabetes, Plan Your Plate with Diabetes, Nutrition  GuidelinesMONITORING/EVALUATION: SMBG, wt gain trend*Next visit:  Healthy Snack equation*Time: 3:45 PM - 5:15 PM.  .  FOLLOW UP  .  4 Weeks  .  Electronically signed by Marlane Mingle , MS RD LDN  on 10/08/2017 at 11:22 AM EST  .  Document electronically signed by Jake Shark

## 2017-03-27 NOTE — Progress Notes (Signed)
* * *        [name]**    --- ---    41 Y old Female, DOB: [birth year], External MRN: [id number]    Account Number: [id number]    [address], [place], [id number]    Home: [phone]    Insurance: HMO [employer]    PCP: [name] Referring: [name]    Appointment Facility: [facility]        * * *    03/27/2017  Progress Notes: Marlane Mingle, MS, RD, LDN **CHN#:** [phone]    --- ---    ---        Reason for Appointment    ---      1\. pregestational DM    ---      History of Present Illness    ---     _General Nutrition_ :    SUBJECTIVE: 41 Y/F with T2DM, obesity, hypertension referred from MFM (Dr.  [name] Aris) for diabetes affecting pregnancy. G1 P0; EDD 09/11/2017; [redacted] weeks  gestation today. DM medication: Lantus insulin (40 units/day in morning). "I  am a diabetic and we are trying to maintain my sugar levels. I want the baby  to be as healthy as possible." Doesn't add salt; looks at labels for sodium  now. PREVIOUS MNT/WHEN/WHERE: MNT for diabetes <10 years ago; DM diagnosed age  41. EDUCATION: college. 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  LIVING ARRANGEMENTS: lives alone. WEIGHT HISTORY: 220-225 lbs pre-pregnancy.  CURRENT MEAL PLAN: 3 meals + 3 snacks.    _Pertinent Labs_ :    02/13/17 A1c, POC 8.1 H    12/11/16 A1c, POC 8.6 H    08/15/16 A1c, POC 9.6 H.      Current Medications    ---    Taking     * Aspirin Low Dose 81 MG Tablet Chewable 1 tablet Orally Once a day    ---    * Lantus 42u daily    ---    * NIFEdipine 60mg  daily    ---    * Prenatal     ---      Past Medical History    ---       Diabetes .        ---    Chronic hypertension.        ---    Seizures as a child.        ---      Allergies    ---      Penicillin: rash    ---      Vital Signs    ---    Ht-in 66, Wt-lbs 212, BMI 34.21, Ht-cm 167.64, Wt-kg 96.16    *Wt is from earlier today in MFM.      Assessments    ---    1\. Diabetes mellitus affecting pregnancy in second  trimester - O24.912  (Primary)    ---    2\. Maternal chronic hypertension in second trimester - O10.912    ---      Pre-pregnancy Weight: 220-225 lbs (stated)    Pre-pregnancy BMI: 35.5    Pregnancy weight gain: 4 lbs loss/1 week between 15 and [redacted] weeks gestation!    REE [address] [place]): [phone] kcals/day    Kcals/day for second/third trimester pregnancy: REE x 1.3 = 2189 + 350 =  2539/2189 + 452 = 2641    Protein needs (1.1g/kg mid-pregnancy): 110 g/day  Carbohydrate recommendations: minimum 175 g/day:  45/(15-30)/45/(15-30)/45/(15-30) in 3 meals + 3 snacks    Sodium recommendations: <2000 mg/day    NUTRITION DIAGNOSIS: Food- and nutrition-related knowledge deficit; related to  lack of recent exposure to MNT for T2DD and no previous exposure to MNT for  pregestational DM; as evidenced by pt report and pregnancy.    ---      Treatment    ---       **1\. Others**    Notes: INTERVENTION:    *Provided positive reinforcement for SMBG, reading labels for sodium, 3 meals/day pattern.    *Medical Nutrition Therapy (MNT)/Nutrition Education/Nutrition Counseling details: Explained: rationale for lower sodium in a setting of HTN, lower sodium seasoning suggestions; <2000 mg sodium/day. Explained: T2DM and T2DM during pregnancy (pregestational diabetes); insulin resistance; insulin requirements during pregnancy; relationship between food (especially carbohydrate), insulin, pregnancy hormones, and blood glucose (BG); T2DM management during pregnancy [food choices + exercise + medication (Lantus insulin)] and rationale for recommendations; SMBG goals of <90 fasting and <140 post-prandial (1 hour), <120 (2 hours) timed from first bite of meal (per ADA recommendation), and rationale for lower SMBG goals in pregnancy; effects of carbohydrate-, protein-, and fat-containing foods on BG and food examples of each; importance of consistent carbohydrate intake; importance of including fiber, protein, and fat with carbohydrate  at meals (balance); appropriate meal and snack spacing; carbohydrate counting (carb choices and grams, using diabetes plate method and My Gestational Diabetes Meal Plan education materials); nutrition label reading; rationale for avoiding juice and sugary beverages for T2DM pregnancy; possible avoidance of fruit in the morning (breakfast and mid-morning snack) when insulin resistance is higher (if post-meal BGs remain high); appropriate portions and balance of carbohydrate-, protein-, and fat-containing foods at meals according to the diabetes breakfast equation and diabetes plate method, snack examples according to the healthy snack equation; meal plan with carbohydrate distribution of 45 or 30/(15-30)/45/(15-30)/45/(15-30) (3 meals + 3 snacks). Encouraged snacks to prevent hypoglycemia, especially evening snack to prevent ketones overnight; recommended no more than 10 hours between evening food and breakfast the next morning. Explained method of action of basal insulin.     *Individualized recommendations:    1\. Try Mrs. Dash seasoning blends (sodium-free) instead of seasoned salt.    2\. Look at Nutrition labels for sodium, especially on frozen entrees. 500 mg  for a whole dinner is okay. Goal for day is <2000 mg/day. That's why sausage @  1200 mg/serving is too high.    3\. Aim for 3 carb choices (45 g carbohydrate) per meal within a balanced  meal. You may need to decrease to 2 carb choics at breakfast if after meal  blood glucose is >140. Aim for 1-2 carb choices at each of 3 snacks.    4\. Let your blood sugar be your guide as to how your body handles a  particular meal.    5\. Drink water or seltzer instead of cranberry juice or ginger-ale, as 4 oz  of either would correct a low blood glucose within 10 minutes.    *Educational materials provided: Diabetes Management booklet (2016), Breakfast Equation for Diabetes, Plan Your Plate with Diabetes, Nutrition Guidelines    MONITORING/EVALUATION: SMBG, wt gain  trend    *Next visit: Healthy Snack equation    *Time: 3:45 PM - 5:15 PM.     ---      Follow Up    ---    4 Weeks    Electronically signed by Marlane Mingle , MS  RD LDN on 10/08/2017 at 11:22 AM  EST    Sign off status: Completed        * * *        [facility]    [address]    [place], [place] [address]    Tel: [phone]    Fax: [phone]              * * *          Patient: [name], [name] DOB: 04-15-1976 Progress Note: Marlane Mingle, MS,  RD, LDN 03/27/2017    ---    Note generated by eClinicalWorks EMR/PM Software (www.[email or web address])

## 2017-03-27 NOTE — Progress Notes (Signed)
.    Progress Notes  .  Patient: [name], [name]  Provider: Marlowe Kays D   .  DOB: April 09, 1976 Age: 41 Y Sex: Female  .  PCP: [name]   Date: 03/27/2017  .  --------------------------------------------------------------------------------  .  CURRENT MEDICATIONS  .  Taking Aspirin Low Dose 81 MG Tablet Chewable 1 tablet Orally  Once a day, Taking Lantus 40u daily, Taking NIFEdipine 60mg   daily, Taking Prenatal , Medication List reviewed and reconciled  with the patient  .  PAST MEDICAL HISTORY  .  Diabetes , Chronic hypertension, Seizures as a child, Pregnancy:  yes.  .  ALLERGIES  .  Penicillin: rash.  .  SOCIAL HISTORY  .  .  Tobaccohistory:Formerly smoked quits when found out pregnant.  .  Marital Status Single.  .  Work/Occupation: employed full-time doind Therapist, sports.  .  Alcohol Denies.  .  Illicit drugs: Used marijuana 3-4 x per day until pregnant then  stopped.  .  Lives with: Alone.  .  GYN HISTORY  .  Hx Abnormal pap smear yes, with biopsy in past but no treatment.  Last pap 2015.  Menstrual History LMP 12/05/2016  STD HPV/condyloma, HSV/Herpes - outbreaks rare  Pregnancy #1 Date- /2006, Del- TAB, Comments- No complications  .  OB HISTORY  .  Previous Pregnancies Total Pregnancies: 1, Full Term: 0,  Premature: 0, AB. Induced: 0, AB. Spontaneous: 0, Ectopics: 0,  Multiple Births: 0, Living: 0  .  VITAL SIGNS  .  Pain scale 0/10, Wt-lbs 212, BP UE 136, BP LE 73.  .  ASSESSMENTS  .  Maternal chronic hypertension in second trimester - O10.912  (Primary)  .  Other viral diseases complicating pregnancy, unspecified  trimester - O98.519  .  Diabetes mellitus affecting pregnancy in second trimester -  O24.912  .  Medication exposure during first trimester of pregnancy - O09.891  .  Elderly multigravida in second trimester - O09.522  .  TREATMENT  .  Maternal chronic hypertension in second trimester  Continue Aspirin Low Dose Tablet Chewable, 81 MG, 1 tablet,  Orally, Once a day  .  [name]  Others  Continue  NIFEdipine, 60mg , daily  Continue Prenatal  Continue Lantus, 40u, daily  .  PROCEDURE CODES  .  09811 PRENATAL VISIT  .  FOLLOW UP  .  1 Week  .  Electronically signed by Marlowe Kays , MD on  04/03/2017 at 11:32 AM EDT  .  Document electronically signed by Marlowe Kays D   .

## 2017-03-28 ENCOUNTER — Ambulatory Visit

## 2017-03-28 LAB — HX IMMUNOLOGY
HX HEPATITIS B SURFACE ANTIGEN: NONREACTIVE
HX RUBELLA IGG: IMMUNE
HX V. ZOSTER IMMUNE STATUS: IMMUNE

## 2017-03-28 NOTE — Telephone Encounter (Signed)
----   Converted from flag ----  ---- 03/27/2017 9:39 AM, Coralyn Pear wrote:  I called patient to help schedule. I left a VM asking if she would like me to overbook this week or to be seen next week. You had a cancellation on 5/29 at 3:40pm. I did schedule [place] for that day/time just to save a spot for her. I also left that in the VM and asked her to call us back if that date does not work for her.    ---- 03/26/2017 2:30 PM, [name], MD wrote:  Please outreach to pt, does she want to come in this week or next? Jul  ------------------------------    Created By [name] MD on 03/28/2017 at 10:07 AM    Electronically Signed By [name] MD on 03/28/2017 at 10:07 AM

## 2017-03-29 LAB — HX MICRO

## 2017-04-03 ENCOUNTER — Ambulatory Visit

## 2017-04-03 MED ORDER — INSULIN GLARGINE: 1 | 1 | 0 refills | 0 days

## 2017-04-03 MED ORDER — INSULIN SYRINGE-NEEDLE U-100: 100 | 0 refills | 0 days | Status: AC

## 2017-04-03 NOTE — Telephone Encounter (Signed)
Prescriptions:  INSULIN SYRINGE 30G X 1/2" 0.5 ML (INSULIN SYRINGE-NEEDLE U-100) use to inject insulin daily under skin  #100 x 0   Entered and Authorized by: Wyline Mood, MD   Signed by: Wyline Mood, MD on 04/03/2017   Method used: Printed then faxed to ...     [facility]* (retail)     [address]     Store [place], [place]  [address]     Ph: [phone]     Fax: [phone]   RxID: [id number]  LANTUS 100 UNIT/ML SUBCUTANEOUS SOLUTION (INSULIN GLARGINE) 40 units  udner skin daily  #1 Bottle x 0   Entered and Authorized by: Wyline Mood, MD   Signed by: Wyline Mood, MD on 04/03/2017   Method used: Printed then faxed to ...     [facility]* (retail)     [address]     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) (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)  HYPERTENSION (ICD-401.9) (ICD10-I10)  SHORTNESS OF BREATH (ICD-786.05) (ICD10-R06.02)  ANNUAL EXAM (ICD-V72.31) (ICD10-Z00.00)  DEPRESSION (ICD-311) (ICD10-F32.9)      ANXIETY (ICD-300.00) (ICD10-F41.9)  ABNORMAL PAP SMEAR 12/11 LSIL; COLPO 12/11; BX CIN I; 6/12 ASCUS PAP; 4/13 PAP NEG; HR HPV POS, NEG COLPO; 5/14 PAP/HR HPV NEG (ICD-795.00) (ICD10-R87.619)  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)  PATELLAR DISLOCATION-MEDIAL RETINACULAR TEAR; RIGHT KNEE (ICD-836.3) (ICD10-S83.006)  OBESITY (ICD-278.00) (ICD10-E66.9)  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)  SEXUALLY TRANSMITTED DISEASE, EXPOSURE TO-H/O GC, TRICH, HPV, CHLAMYDIA  (ICD-V01.6) (ICD10-Z20.2)  S/P CHOLECYSTECTOMY (ICD-V45.79) (ICD10-Z90.49)    Allergies:   PENICILLIN V POTASSIUM (PENICILLIN V POTASSIUM) (Critical)    Meds (prior to this call):   LANTUS 100 UNIT/ML SUBCUTANEOUS SOLUTION (INSULIN GLARGINE) 48 units daily as directed  ATORVASTATIN CALCIUM 40 MG ORAL TABLET (ATORVASTATIN CALCIUM) Take one tablet by mouth once daily      * ACCUCHECK GLUCOMETER check sugars three times daily Dx: Diabetes type II with retinopathy E11.319      RELION ALL-IN-ONE DEVICE (BLOOD GLUC METER DISP-STRIPS) Use three times daily (whichever brand covered/with meter)      LANCETS (LANCETS) Use as directed.      INSULIN SYRINGE 30G X 1/2" 0.5 ML (INSULIN SYRINGE-NEEDLE U-100)       BD DISP NEEDLES 30G X 1/2" (NEEDLE (DISP)) use small needle to inject insulin  NIFEDIPINE ER 60 MG ORAL TABLET EXTENDED RELEASE 24 HOUR (NIFEDIPINE) Take one tablet by mouth once daily  GNP PRENATAL VITAMINS 28-0.8 MG ORAL TABLET (PRENATAL VIT-FE FUMARATE-FA) take one by mouth daily  ACCU-CHEK AVIVA IN VITRO STRIP (GLUCOSE BLOOD) use 1-3 times a day   dx E11 .319    Changes to Meds (this update):   Changed medication from LANTUS 100 UNIT/ML SUBCUTANEOUS SOLUTION (INSULIN GLARGINE) 48  units daily as directed to LANTUS 100 UNIT/ML SUBCUTANEOUS SOLUTION (INSULIN GLARGINE) 40 units  udner skin daily - Signed  Changed medication from INSULIN SYRINGE 30G X 1/2" 0.5 ML (INSULIN SYRINGE-NEEDLE U-100) to INSULIN SYRINGE 30G X 1/2" 0.5 ML (INSULIN SYRINGE-NEEDLE U-100) use to inject insulin daily under skin - Signed  Rx of LANTUS 100 UNIT/ML SUBCUTANEOUS SOLUTION (INSULIN GLARGINE) 40 units  udner skin daily;  #1 Bottle x 0;  Signed;  Entered by: Wyline Mood, MD;  Authorized by: Wyline Mood, MD;  Method used: [facility] then [facility] [address], [place], [place]  [address], Ph: [phone], Fax: [phone]  Rx of INSULIN SYRINGE 30G X 1/2" 0.5 ML (INSULIN SYRINGE-NEEDLE U-100) use to inject insulin daily under skin;   #100 x 0;  Signed;  Entered by: Wyline Mood, MD;  Authorized by: Wyline Mood, MD;  Method used: Printed then [facility] to [facility] [address], [address], [place], [place]  [address], Ph: [phone], Fax: [phone]        Created By Wyline Mood MD on 04/03/2017 at 06:22 PM    Electronically Signed By Wyline Mood MD on 04/03/2017 at 06:27 PM

## 2017-04-05 ENCOUNTER — Ambulatory Visit

## 2017-04-09 ENCOUNTER — Ambulatory Visit

## 2017-04-10 ENCOUNTER — Ambulatory Visit: Admitting: Maternal & Fetal Medicine

## 2017-04-10 ENCOUNTER — Ambulatory Visit

## 2017-04-10 ENCOUNTER — Ambulatory Visit: Admit: 2017-04-10 | Payer: HMO

## 2017-04-11 ENCOUNTER — Ambulatory Visit

## 2017-04-12 ENCOUNTER — Ambulatory Visit

## 2017-04-12 MED ORDER — INSULIN GLARGINE: 1 | 3 | 0 refills | 0 days

## 2017-04-17 ENCOUNTER — Ambulatory Visit: Admitting: Obstetrics & Gynecology

## 2017-04-17 ENCOUNTER — Ambulatory Visit

## 2017-04-17 ENCOUNTER — Ambulatory Visit: Admit: 2017-04-17 | Payer: HMO

## 2017-04-19 ENCOUNTER — Ambulatory Visit

## 2017-04-19 MED ORDER — NIFEDIPINE: 1 | 90 | 3 refills | 0 days | Status: DC

## 2017-04-26 ENCOUNTER — Ambulatory Visit

## 2017-04-26 ENCOUNTER — Ambulatory Visit: Admitting: Internal Medicine

## 2017-04-26 ENCOUNTER — Ambulatory Visit: Admit: 2017-04-26 | Payer: HMO

## 2017-04-26 NOTE — Progress Notes (Signed)
* * *        [name]**    --- ---    15 Y old Female, DOB: [birth year], External MRN: [id number]    Account Number: [id number]    [address], [place], [id number]    Home: [phone]    Insurance: HMO [employer]    PCP: [name] Referring: [name]    Appointment Facility: Endocrinology        * * *    04/26/2017  Progress Notes: [name], MD **CHN#:** [phone]    --- ---    ---        Reason for Appointment    ---      1\. NP/DIABETIC RETINOPATHYPER DR.TISHLER    ---    2\. Diabetes Type 2    ---      History of Present Illness    ---     _GENERAL_ :    41 yo F with history of DM II, HTN, HLD, and currently [redacted] wks pregnant, who  presents for diabetes management during pregnancy. Patient was diagnosed in  her early 41's with type II Diabetes and was on metformin at some point, but  has been on insulin for the past few years (<1 decade per patient). She has  only been on lantus only. For the most part, patient says that the lantus has  kept her diabetes under good control.    Patient says that she has severe dry eye with blurry vision that's worse after  staring at the computer for too long. Dr. [name] tried to get her to see an  opthalmologist, and she saw one in April, but her eyes were not dilated for  the exam as they found out that she was pregnant.    Patient denies any symptoms of hyperglycemia or hypoglycemia at home.    Today the patient denies excessive thirst, polyuria, HA, light headedness,  SOB, CP, abdominal pain, dysphagia, nausea, vomiting, diarrhea, fever or  chills.    ---    HbA1c    8.1% 02/13/2017    ---    Blood Sugar readings ( meter)    - Fasting: 70s-80s    - B: 125-130    - L: 130s-150s    - D: 130s-140s    ---    Activity    - walks a couple of times a week for 15-23min each time    - lift weights at home sometimes    ---    Diet    B - mixed graincheerios with almond milk, boiled eggs    L - [place] sandwich/healthy choice meals    D -  salad/fish/steak    S - mixed nuts, cucumbers, carrots    ---    Diabetes meds    lantus 42 units QAM.      Current Medications    ---    Taking     * Aspirin Low Dose 81 MG Tablet Chewable 1 tablet Orally Once a day    ---    * Colace 100 MG Capsule 1 capsule as needed Orally Once a day    ---    * Lantus 100 UNIT/ML Solution 42 UNITS Subcutaneous Once a day    ---    * NIFEdipine ER 60 MG Tablet Extended Release 24 Hour 1 tablet on an empty stomach Orally Once a day    ---    * Prenatal     ---    *  Medication List reviewed and reconciled with the patient    ---      Past Medical History    ---       Diabetes .        ---    Chronic hypertension.        ---    Seizures as a child.        ---    Pregnancy: yes.        ---      Surgical History    ---      lap cholecystectomy 2010    ---      Family History    ---      Mother: alive 89 yrs, diagnosed with Diabetes, Hypertension    ---    Father: deceased, diagnosed with Heart Disease in 50    ---    Maternal Grand Mother: alive, diagnosed with Diabetes, Hypertension    ---    Parnter is 42 and is [place]. No fam hx of congen anomalies, MR, stillbirth,  sickle cell disease.    Father- H/o CHF    Mother - h/o diabetes and HTN.    ---      Social History    ---    Tobacco    history: _Formerly smoked quits when found out pregnant_    Marital Status    _Single_    Work/Occupation: employed full-time doing Therapist, sports.    Alcohol    _Denies_    Illicit drugs: Used marijuana 3-4 x per day until pregnant then stopped.    Lives with: Alone.      Allergies    ---      Penicillin: rash    ---      Review of Systems    ---     _Endo: Bone_ :    Constitutional: no fever, no abnormal weight change, no chills. Eyes: some  blurriness in vision at times after long-time screen-time. Head: no sore  throat, no ear ache, no congestion, no hair loss. Cardiovascular: no chest  pain, no palpitations, no lightheadedness. Pulmonary: no cough, no shortness  of breath. GI: no abdominal  pain, no diarrhea, no vomiting, no nausea, no  epigastric pain; some constipation. GU: no polyuria, no nocturia, no  hematuria, no dysuria, no hx of kidney stones. Skin: no rashes, no lesions, no  edema, no abnormal striae, no easy bruising, wound healing normal.    A complete review of systems was negative except for those mentioned in the  HPI.      Vital Signs    ---    Pain scale 0, Ht-in 66, Wt-lbs 217, BMI 35.02, BP 127/78, HR 81, BSA 2.14, O2  100, Ht-cm 167.64, Wt-kg 98.43, Wt Change 4 lb.      Physical Examination    ---     _Endo: Thyroid Clinic_ :    General: appears well, no distress.    HEENT: NC/AT, EOMI, no proptosis, no lid lag.    Neck: no erythema, no tracheal deviation, negative Pemberton's sign,  acanthosis nigricans noted.    Lungs: good air entry, clear bilaterally.    Heart: S1 & S2 normal, regular rate & rhythm, no murmurs, rubs, or gallops.    Abdomen: soft, non-tender, non-distended, obese.    Extremities: no tremor in hands bilaterally, no clubbing, cyanosis, or edema,  good muscle mass.    Neurology: normal gait, normal 5/5 strength, no proximal myopathy.    Dermatology: no skin dryness,  anicteric.    Psych: no anxiety, no depression.          Assessments    ---    1\. Pre-existing type 2 diabetes mellitus during pregnancy in second trimester  - O24.112 (Primary)    ---    2\. Obesity (BMI 30.0-34.9) - E66.9    ---    3\. Essential hypertension - I10    ---      \---    1\. Diabetes type 2 during pregnancy    - patient's BS appears to be mostly under good control with diet/exercise  along with lantus use, though patient still having occasional spikes after  meals    - will decrease lantus from 42 units to 35 units QAM and start patient on  standing humalog 3 units TID before meals.    - advised patient to avoid injecting in the same area each time to avoid  areas of edema (can also inject underneath arms or in thighs)    - will place patient on Libre for BS monitoring for 2 weeks.    -  Follow up in 4 weeks with labs at that time.    ---    2\. Diabetes associated problems    - HTN: continue home nifedipine    - HLD: currently holding statin given pregnancy    - Eyes: patient with some vision blurriness after staring at screen for a  long time(?dry eye/fatigue vs diabetic retinopathy), encouraged patient to go  back to ophtho for proper eye exam    - Kidneys: normal Cr 0.8 in April/2018    - Feet: normal foot exam with no loss of sensation/numbness tingling    - CAD: asymptomatic currently, denies chest pain. Stress ECHO on 02/13/17  negative for signs of ischemia    ---    3\. obesity/nutrition    - patient with good diet currently and exercises. Encouraged to walk for  3-4 times a week instead. Advised patient to maintain current weight  during pregnancy, but can try to lose weight again after delivery.    ---    I personally interviewed and examined the patient and reviewed resident's note  and added my changes above. I agree with the history, exam, assessment and  plan as detailed in the resident's note.    ---      Treatment    ---       **1\. Pre-existing type 2 diabetes mellitus during pregnancy in second  trimester**    Refill Lantus Solution, 100 UNIT/ML, 35 units (max of 45), Subcutaneous, Once  a day, 90 days, 90, Refills 4    Start Accu-Chek Aviva-Blood Glucose Test Strips, 1, as directed, SQ, 8 times  per day, 90 days, 900, Refills 3    Start Accu-Chek Softclix Lancets Miscellaneous, 1, as directed, SQ, 6 times  per day, 90 days, 900, Refills 3    Start Humalog KwikPen Solution Pen-injector, 100 UNIT/ML, 3 units as directed  (max 15 units daily), Subcutaneous, 4 times a day, 90 days, Refills 4    Start BD Pen Needle Short U/F Miscellaneous, 31G X 8 MM, as directed, SQ, 4  times a day, 90 days, 4 boxes, Refills 3    _LAB: Hemoglobin A1c (Glycohemoglobin) (Ordered for 05/17/2017)_    _LAB: Fructosamine (Glycosylated Albumin) (Ordered for 05/17/2017)_    _LAB: Basic Metabolic Profile  (BMP) (Ordered for 05/17/2017)_    _LAB: Liver Function Tests (Ordered for 05/17/2017)_    _LAB: Albumin, Random  Urine (Ordered for 05/17/2017)_    _LAB: Vitamin D, 25 hydroxy (Ordered for 05/17/2017)_    Notes: 1. Check blood sugars upon waking before meals 1-2 after meals. Also  check before bed this should equate to around 8 times daily.    2\. Get lab work before next visit    3\. Reduce Lantus to 35 units    4\. Start Humalog 3 units before meals.    5\. Pick up libre sensors and reader from pharmacy once approved.    6\. Come in in 2 weeks to have [employer].    ---      Procedure Codes    ---      [phone] GLUCOSE MONITORING, CONT    ---      Follow Up    ---    schedule every 4 weeks x 2 (40 minute appt if possible), labs before next  visit, sign up for the portal    Electronically signed by [name] MD on 04/30/2017 at 01:42 PM EDT    Sign off status: Completed        * * *        Endocrinology    [address]    [place], [place] [address]    Tel: [phone]    Fax: [phone]              * * *          Patient: [name], [name] DOB: July 31, 1976 Progress Note: [name], MD  04/26/2017    ---    Note generated by eClinicalWorks EMR/PM Software ([email or web address])

## 2017-04-26 NOTE — Progress Notes (Signed)
.  Progress Notes  .  Patient: [name]  Provider: [name]  MD  .  DOB: May 26, 1976 Age: 41 Y Sex: Female  .  PCP: [name]   Date: 04/26/2017  .  --------------------------------------------------------------------------------  .  REASON FOR APPOINTMENT  .  1. NP/DIABETIC RETINOPATHYPER DR.TISHLER  .  2. Diabetes Type 2  .  HISTORY OF PRESENT ILLNESS  .  GENERAL:   41 yo F with history of DM II, HTN, HLD, and currently [redacted] wks  pregnant, who presents for diabetes management during pregnancy.  Patient was diagnosed in her early 20's with type II Diabetes and  was on metformin at some point, but has been on insulin for the  past few years (<1 decade per patient). She has only been on  lantus only. For the most part, patient says that the lantus has  kept her diabetes under good control. Patient says that she has  severe dry eye with blurry vision that's worse after staring at  the computer for too long. Dr. [name] tried to get her to see an  opthalmologist, and she saw one in April, but her eyes were not  dilated for the exam as they found out that she was pregnant.  Patient denies any symptoms of hyperglycemia or hypoglycemia at  home. Today the patient denies excessive thirst, polyuria, HA,  light headedness, SOB, CP, abdominal pain, dysphagia, nausea,  vomiting, diarrhea, fever or chills.---HbA1c8.1%  02/13/2017---Blood Sugar readings ( meter)- Fasting: 70s-80s- B:  125-130- L: 130s-150s- D: 130s-140s---Activity- walks a couple of  times a week for 15-62min each time- lift weights at home  sometimes---DietB - mixed graincheerios with almond milk, boiled  eggsL - [place] sandwich/healthy choice mealsD - salad/fish/steakS  - mixed nuts, cucumbers, carrots---Diabetes meds lantus 42 units  QAM.  .  CURRENT MEDICATIONS  .  Taking Aspirin Low Dose 81 MG Tablet Chewable 1 tablet Orally  Once a day  Taking Colace 100 MG Capsule 1 capsule as needed Orally Once a  day  Taking Lantus 100 UNIT/ML  Solution 42 UNITS Subcutaneous Once a  day  Taking NIFEdipine ER 60 MG Tablet Extended Release 24 Hour 1  tablet on an empty stomach Orally Once a day  Taking Prenatal  Medication List reviewed and reconciled with the patient  .  PAST MEDICAL HISTORY  .  Diabetes  Chronic hypertension  Seizures as a child  Pregnancy: yes  .  ALLERGIES  .  Penicillin: rash  .  SURGICAL HISTORY  .  lap cholecystectomy 2010  .  FAMILY HISTORY  .  Mother: alive 65 yrs, diagnosed with Diabetes, Hypertension  Father: deceased, diagnosed with Heart Disease in 82  Maternal Grand Mother: alive, diagnosed with Diabetes,  Hypertension  Parnter is 9 and is [place]. No fam hx of congen anomalies, MR,  stillbirth, sickle cell disease. Father- H/o CHFMother - h/o  diabetes and HTN.  .  SOCIAL HISTORY  .  .  Tobacco  history:Formerly smoked quits when found out pregnant  .  [name]  Marital Status  Single  .  [name]  Work/Occupation: employed full-time doing Therapist, sports.  .  .  Alcohol  Denies  .  [name]  Illicit drugs: Used marijuana 3-4 x per day until pregnant then  stopped.  .  .  Lives with: Alone.  [name]  REVIEW OF SYSTEMS  .  Endo: Bone:  .  Constitutional:    no fever, no abnormal weight change, no chills  .  Eyes:    some blurriness in vision at times after long-time  screen-time . Head:    no sore throat, no ear ache, no  congestion, no hair loss . Cardiovascular:    no chest pain, no  palpitations, no lightheadedness . Pulmonary:    no cough, no  shortness of breath . GI:    no abdominal pain, no diarrhea, no  vomiting, no nausea, no epigastric pain; some constipation . GU:     no polyuria, no nocturia, no hematuria, no dysuria, no hx of  kidney stones . Skin:    no rashes, no lesions, no edema, no  abnormal striae, no easy bruising, wound healing normal .  .  A complete review of systems was negative except for those  mentioned in the HPI.  [name]  VITAL SIGNS  .  Pain scale 0, Ht-in 66, Wt-lbs 217, BMI 35.02, BP 127/78, HR 81,  BSA 2.14, O2 100, Ht-cm  167.64, Wt-kg 98.43, Wt Change 4 lb.  .  PHYSICAL EXAMINATION  .  Endo: Thyroid Clinic:  General:  appears well, no distress.  HEENT:  NC/AT, EOMI, no proptosis, no lid lag.  Neck:  no erythema, no tracheal deviation, negative Pemberton's  sign, acanthosis nigricans noted.  Lungs:  good air entry, clear bilaterally.  Heart:  S1 & S2 normal, regular rate & rhythm, no murmurs, rubs,  or gallops.  Abdomen:  soft, non-tender, non-distended, obese.  Extremities:  no tremor in hands bilaterally, no clubbing,  cyanosis, or edema, good muscle mass.  Neurology:  normal gait, normal 5/5 strength, no proximal  myopathy.  Dermatology:  no skin dryness, anicteric.  Psych:  no anxiety, no depression.  .  ASSESSMENTS  .  Pre-existing type 2 diabetes mellitus during pregnancy in second  trimester - O24.112 (Primary)  .  Obesity (BMI 30.0-34.9) - E66.9  .  Essential hypertension - I10  .  ---1. Diabetes type 2 during pregnancy- patient's BS appears to  be mostly under good control with diet/exercise along with lantus  use, though patient still having occasional spikes after meals-  will decrease lantus from 42 units to 35 units QAM and start  patient on standing humalog 3 units TID before meals. - advised  patient to avoid injecting in the same area each time to avoid  areas of edema (can also inject underneath arms or in thighs)-  will place patient on Libre for BS monitoring for 2 weeks. -  Follow up in 4 weeks with labs at that time. ---2. Diabetes  associated problems- HTN: continue home nifedipine- HLD:  currently holding statin given pregnancy- Eyes: patient with some  vision blurriness after staring at screen for a long time(?dry  eye/fatigue vs diabetic retinopathy), encouraged patient to go  back to ophtho for proper eye exam - Kidneys: normal Cr 0.8 in  April/2018- Feet: normal foot exam with no loss of  sensation/numbness tingling- CAD: asymptomatic currently, denies  chest pain. Stress ECHO on 02/13/17 negative for signs  of  ischemia---3. obesity/nutrition- patient with good diet currently  and exercises. Encouraged to walk for 3-4 times a week  instead. Advised patient to maintain current weight during  pregnancy, but can try to lose weight again after delivery. --- I  personally interviewed and examined the patient and reviewed  resident's note and added my changes above. I agree with the  history, exam, assessment and plan as detailed in the resident's  note.  .  TREATMENT  .  Pre-existing  type 2 diabetes mellitus during  pregnancy in second trimester  Refill Lantus Solution, 100 UNIT/ML, 35 units (max of 45),  Subcutaneous, Once a day, 90 days, 90, Refills 4  Start Accu-Chek Aviva-Blood Glucose Test Strips, 1, as directed,  SQ, 8 times per day, 90 days, 900, Refills 3  Start Accu-Chek Softclix Lancets Miscellaneous, 1, as directed,  SQ, 6 times per day, 90 days, 900, Refills 3  Start Humalog KwikPen Solution Pen-injector, 100 UNIT/ML, 3 units  as directed (max 15 units daily), Subcutaneous, 4 times a day, 90  days, Refills 4  Start BD Pen Needle Short U/F Miscellaneous, 31G X 8 MM, as  directed, SQ, 4 times a day, 90 days, 4 boxes, Refills 3  LAB: Hemoglobin A1c (Glycohemoglobin) (Ordered for 05/17/2017)  .  LAB: Fructosamine (Glycosylated Albumin) (Ordered for 05/17/2017)  .  LAB: Basic Metabolic Profile (BMP) (Ordered for 05/17/2017)  .  LAB: Liver Function Tests (Ordered for 05/17/2017)  .  LAB: Albumin, Random Urine (Ordered for 05/17/2017)  .  LAB: Vitamin D, 25 hydroxy (Ordered for 05/17/2017)  .  Notes: 1. Check blood sugars upon waking before meals 1-2 after  meals. Also check before bed this should equate to around 8 times  daily.2. Get lab work before next visit3. Reduce Lantus to 35  units4. Start Humalog 3 units before meals.5. Pick up libre  sensors and reader from pharmacy once approved.6. Come in in 2  weeks to have [employer].  [name]  PROCEDURE CODES  .  95250 GLUCOSE MONITORING, CONT  .  FOLLOW  UP  .  schedule every 4 weeks x 2 (40 minute appt if possible), labs  before next visit, sign up for the portal  .  Electronically signed by [name] MD on  04/30/2017 at 01:42 PM EDT  .  Document electronically signed by [name]  MD  .

## 2017-04-26 NOTE — Progress Notes (Signed)
* * *        [name]**    --- ---    59 Y old Female, DOB: August 26, 1976    [address], [place], [place] [address]    Home: [phone]    Provider: [name], MD        * * *    Telephone Encounter    ---    Answered by   [name]  Date: 04/26/2017         Time: 10:54 AM    Action Taken   [name] , MD 04/26/2017 10:54:56 AM > Heidi, this  patient has gestational diabetes and I would like to get her a libre sensor  system. Can you check where to send it if approved? [name],Heidi M 05/03/2017  6:16:03 PM > I ran a test claim for #3 for 30 day supplt and it does not  require a co pay, it is a $50 co pay. You can send it to [facility], or  if the pt prefers a different pharmacy... but it is covered under pharmacy  benefit. Marcelyn Ruppe , MD 05/04/2017 10:09:33 AM > Provided feedback to  the patient via portal messaging system. sent to pharmacy ([facility]).    --- ---            Refills  Start FreeStyle [facility] System Miscellaneous, 1, SQ, 9, as  directed, 1 per 10 days, 90 days, Refills=4    --- ---      [facility] Reader Device, 1, SQ, 1 kit, as directed, continuous,  90 days, Refills=3          * * *         **eMessages**   From:   Caileen Veracruz    --- ---    Created:   2017-05-04 10:11:21    Sent:   2017-05-04 10:11:24    Subject:   RE:    MessageShanon Payor , MD 04/26/2017 10:54:56 AM > Heidi, this patient has gestational diabetes and I would like to get her a libre sensor system. Can you check where to send it if approved?      [name],Heidi M 05/03/2017 6:16:03 PM > I ran a test claim for #3 for 30 day supplt and it does not require a co pay, it is a $50 co pay.  You can send it to [facility], or if the pt prefers a different pharmacy... but it is covered under pharmacy benefit.      [name] , MD 05/04/2017 10:09:33 AM >    [name],    The libre sensor system is covered although will cost $50  monthly according to  our staff research. I will send it to your pharmacy and you can decide whether  to pick it up or not (at least wear it for the pregnancy). You can come into  the clinic without an appt to be shown how to put it on and use it.    -Dr. Seth Bake.                      * * *        ---           **Medical History:**   Diabetes .    ---    Chronic hypertension.    ---  Seizures as a child.    ---      Allergies    ---      Penicillin: rash    ---      Assessments    ---    1\. Pre-existing type 2 diabetes mellitus during pregnancy in second trimester  - O24.112    ---      Treatment    ---       **1\. Pre-existing type 2 diabetes mellitus during pregnancy in second  trimester**    Start FreeStyle [employer] Miscellaneous, 1, as directed, SQ, 1 per  10 days, 90 days, 9, Refills 4    Start FreeStyle Libre Reader Device, 1, as directed, SQ, continuous, 90 days,  1 kit, Refills 3    ---          * * *          PatientSHATERA, [name] DOB: April 19, 1976 Provider: [name], MD  04/26/2017    ---    Note generated by eClinicalWorks EMR/PM Software ([email or web address])

## 2017-04-27 ENCOUNTER — Ambulatory Visit: Admitting: Specialist

## 2017-04-27 ENCOUNTER — Ambulatory Visit

## 2017-04-27 ENCOUNTER — Ambulatory Visit: Admitting: Pediatrics

## 2017-04-27 ENCOUNTER — Ambulatory Visit: Admit: 2017-04-27 | Payer: HMO

## 2017-04-27 NOTE — Progress Notes (Signed)
* * *        [name]**    --- ---    38 Y old Female, DOB: 06-18-1976, External MRN: [id number]    Account Number: [id number]    [address], [place], [id number]    Home: [phone]    Insurance: HMO [place] [employer]    PCP: [name] Referring: [name]    Appointment Facility: Pediatric Cardiology        * * *    04/27/2017  Progress Notes: Karilyn Cota, M.D. **CHN#:** [id number]    --- ---    ---        Current Medications    ---    Taking     * Accu-Chek Aviva-Blood Glucose Test Strips 1 as directed SQ 8 times per day    ---    * Accu-Chek Softclix Lancets 1 Miscellaneous as directed SQ 6 times per day    ---    * Aspirin Low Dose 81 MG Tablet Chewable 1 tablet Orally Once a day    ---    * BD Pen Needle Short U/F 31G X 8 MM Miscellaneous as directed SQ 4 times a day    ---    * Colace 100 MG Capsule 1 capsule as needed Orally Once a day    ---    * Humalog KwikPen 100 UNIT/ML Solution Pen-injector 3 units as directed (max 15 units daily) Subcutaneous with meals    ---    * Lantus 100 UNIT/ML Solution 35 units (max of 45) Subcutaneous Once a day    ---    * NIFEdipine ER 60 MG Tablet Extended Release 24 Hour 1 tablet on an empty stomach Orally Once a day    ---    * Prenatal     ---    * Medication List reviewed and reconciled with the patient    ---      Past Medical History    ---       Diabetes .        ---    Chronic hypertension.        ---    Seizures as a child.        ---      Social History    ---    Tobacco history: Never smoked.      Allergies    ---      Penicillin: rash    ---        Reason for Appointment    ---      1\. Pregnancy diabetes    ---      Assessments    ---    1\. Insulin controlled gestational diabetes mellitus (GDM) during pregnancy,  antepartum - O24.414 (Primary)    ---      Normal fetal cardiac structures, function and rhythm.    Fetal echo cannot rule out a small VSD, ASD, or discrete coarctation of the  aorta.    This test results, subsequent  cardiology surveillance plan and potential  effects on the baby and future pregnancies have been explained to the patient  and her family members.    Thank you very much for your kind referral.    ---      Treatment    ---       **1\. Others**    Notes: I spent 45 minutes total in this patient encounter, where > 50% was  consulting time and  coordination of care.    ---        Diagnostic Imaging    --- ---    _Imaging: Echo, Fetal - Full_    ---       Follow Up    ---    prn      Vital Signs    ---    Pain scale 0, Ht-in 66, Wt-lbs 218.26, BMI 35.22, BP 112/75, HR 82, Ht-cm  167.64, Wt-kg 99.      Procedure Codes    ---      [phone] ECHO EXAM OF FETAL HEART, Modifiers: 26    ---    93325 DOPPLER COLOR FLOW ADD-ON, Modifiers: 59    ---    11914 ECHO EXAM OF FETAL HEART, Modifiers: 26    ---    Electronically signed by Karilyn Cota , M.D. on 04/27/2017 at 11:17 AM EDT    Sign off status: Completed        * * *        Pediatric Cardiology    [address]    [place], [place] [address]    Tel: [phone]    Fax: [phone]              * * *          Patient: [name], [name] DOB: [birth year] Progress Note: Karilyn Cota, M.D.  04/27/2017    ---    Note generated by eClinicalWorks EMR/PM Software ([email or web address])

## 2017-04-27 NOTE — Progress Notes (Signed)
.    Progress Notes  .  Patient: [name]  Provider: Karilyn Cota    .  DOB: [birth year] Age: 41 Y Sex: Female  .  PCP: [name]   Date: 04/27/2017  .  --------------------------------------------------------------------------------  .  REASON FOR APPOINTMENT  .  1. Pregnancy diabetes  .  CURRENT MEDICATIONS  .  Taking Accu-Chek Aviva-Blood Glucose Test Strips 1 as directed SQ  8 times per day  Taking Accu-Chek Softclix Lancets 1 Miscellaneous as directed SQ  6 times per day  Taking Aspirin Low Dose 81 MG Tablet Chewable 1 tablet Orally  Once a day  Taking BD Pen Needle Short U/F 31G X 8 MM Miscellaneous as  directed SQ 4 times a day  Taking Colace 100 MG Capsule 1 capsule as needed Orally Once a  day  Taking Humalog KwikPen 100 UNIT/ML Solution Pen-injector 3 units  as directed (max 15 units daily) Subcutaneous with meals  Taking Lantus 100 UNIT/ML Solution 35 units (max of 45)  Subcutaneous Once a day  Taking NIFEdipine ER 60 MG Tablet Extended Release 24 Hour 1  tablet on an empty stomach Orally Once a day  Taking Prenatal  Medication List reviewed and reconciled with the patient  .  PAST MEDICAL HISTORY  .  Diabetes  Chronic hypertension  Seizures as a child  .  ALLERGIES  .  Penicillin: rash  .  SOCIAL HISTORY  .  .  Tobaccohistory:Never smoked.  [name]  VITAL SIGNS  .  Pain scale 0, Ht-in 66, Wt-lbs 218.26, BMI 35.22, BP 112/75, HR  82, Ht-cm 167.64, Wt-kg 99.  .  ASSESSMENTS  .  Insulin controlled gestational diabetes mellitus (GDM) during  pregnancy, antepartum - O24.414 (Primary)  .  Normal fetal cardiac structures, function and rhythm.Fetal echo  cannot rule out a small VSD, ASD, or discrete coarctation of the  aorta.This test results, subsequent cardiology surveillance plan  and potential effects on the baby and future pregnancies have  been explained to the patient and her family members.Thank you  very much for your kind referral.  .  TREATMENT  .  Others  Notes: I spent 45 minutes total in this  patient encounter, where  > 50% was consulting time and coordination of care.  [name]  DIAGNOSTIC IMAGING  .  Echo, Fetal - [id number]  .  PROCEDURE CODES  .  [id number] ECHO EXAM OF FETAL HEART, Modifiers: 26  .  93325 DOPPLER COLOR FLOW ADD-ON, Modifiers: 59  .  98119 ECHO EXAM OF FETAL HEART, Modifiers: 26  .  FOLLOW UP  .  prn  .  Electronically signed by Karilyn Cota , M.D. on  04/27/2017 at 11:17 AM EDT  .  Document electronically signed by Karilyn Cota    .

## 2017-05-01 ENCOUNTER — Ambulatory Visit: Admitting: Internal Medicine

## 2017-05-01 NOTE — Progress Notes (Signed)
* * *        [name]**    --- ---    74 Y old Female, DOB: 06/14/1976    [address], [place], [place] [address]    Home: [phone]    Provider: [name], MD        * * *    Telephone Encounter    ---    Answered by   Dory Peru  Date: 05/01/2017         Time: 03:53 PM    Reason   Prescription Request    --- ---            Message                      Document attached from fax inbox.                Action Taken   [name] , MD 05/01/2017 5:21:12 PM > , Prescription  was faxed/electronically transferred to pharmacy.            Refills  Continue Humalog KwikPen Solution Pen-injector, 100 UNIT/ML,  Subcutaneous, 1 box, 3 units as directed (max 15 units daily), with meals, 90  days, Refills=4    --- ---          * * *              * * *        ---        Reason for Appointment    ---      1\. Prescription Request    ---      Current Medications    ---    Taking     * Accu-Chek Aviva-Blood Glucose Test Strips 1 as directed SQ 8 times per day    ---    * Accu-Chek Softclix Lancets 1 Miscellaneous as directed SQ 6 times per day    ---    * Aspirin Low Dose 81 MG Tablet Chewable 1 tablet Orally Once a day    ---    * BD Pen Needle Short U/F 31G X 8 MM Miscellaneous as directed SQ 4 times a day    ---    * Colace 100 MG Capsule 1 capsule as needed Orally Once a day    ---    * Humalog KwikPen 100 UNIT/ML Solution Pen-injector 3 units as directed (max 15 units daily) Subcutaneous with meals    ---    * Lantus 100 UNIT/ML Solution 35 units (max of 45) Subcutaneous Once a day    ---    * NIFEdipine ER 60 MG Tablet Extended Release 24 Hour 1 tablet on an empty stomach Orally Once a day    ---    * Prenatal     ---        **Medical History:**   Diabetes .    ---    Chronic hypertension.    ---    Seizures as a child.    ---      Allergies    ---      Penicillin: rash    ---      Assessments    ---    1\. Pre-existing type 2 diabetes mellitus during pregnancy in second trimester  -  O24.112    ---      Treatment    ---       **1\. Pre-existing type 2 diabetes mellitus during  pregnancy in second  trimester**    Continue Humalog KwikPen Solution Pen-injector, 100 UNIT/ML, 3 units as  directed (max 15 units daily), Subcutaneous, with meals, 90 days, 1 box,  Refills 4    ---          * * *          [name], [name] DOB: [birth year] Provider: [name], MD  05/01/2017    ---    Note generated by eClinicalWorks EMR/PM Software ([email or web address])

## 2017-05-02 ENCOUNTER — Ambulatory Visit: Admitting: Internal Medicine

## 2017-05-02 NOTE — Progress Notes (Signed)
* * *        **  [name]**    --- ---    80 Y old Female, DOB: 31-Dec-1975    [address], [place], [place] [address]    Home: [phone]    Provider: [name], MD        * * *    Web Encounter    ---    Answered by   ENDOCRINE, ADMIN  Date: 05/02/2017         Time: 04:41 PM    Caller   [name]    --- ---            Reason   Rx for Humalog            Message                       Addressed ToPatrcia Dolly, Aviv Rota  <br/> Hi Dr. [name],      I'm still waiting on the Rx for Humalog from my visit on 6/21. [facility] and they don't have anything on file/any new Rx. A pharmacist was going to reach out to you but I don't believe that has happened. The Rx needs to be a 90 day supply for insurance to cover.      [facility] :       [facility]      [address] [place], [place] [address]      [phone]      Thank you! [name], MRN [id number]                Action Taken   [name] , MD 05/03/2017 12:56:16 PM > , Prescription  was faxed/electronically transferred to pharmacy., Provided feedback to the  patient via portal messaging system.                * * *                ---          * * *          PatientKARELI, [name] DOB: 1975-11-10 Provider: [name], MD  05/02/2017    ---    Note generated by eClinicalWorks EMR/PM Software ([email or web address])

## 2017-05-02 NOTE — Progress Notes (Signed)
* * *        [name]**    --- ---    68 Y old Female, DOB: May 09, 1976    [address], [place], [place] [address]    Home: [phone]    Provider: [name], MD        * * *    Web Encounter    ---    Answered by   ENDOCRINE, ADMIN  Date: 05/02/2017         Time: 04:41 PM    Caller   [name]    --- ---            Reason   Rx for Humalog            Message                       Addressed To: [name], Cira Deyoe  <br/> Hi Dr. [name],      I'm still waiting on the Rx for Humalog from my visit on 6/21. [facility]. and they don't have anything on file/any new Rx. A pharmacist was going to reach out to you but I don't believe that has happened. The Rx needs to be a 90 day supply for insurance to cover.      [facility] :       [facility]      [address] [place], [place] [address]      [phone]      Thank you! [name], MRN [id number]                Action Taken   [name] , MD 05/03/2017 12:54:00 PM > , Prescription  was faxed/electronically transferred to pharmacy., Provided feedback to the  patient via portal messaging system.            Refills  Refill Humalog KwikPen Solution Pen-injector, 100 UNIT/ML,  Subcutaneous, 1 box, 3 units as directed (max 15 units daily), with meals, 90  days, Refills=4    --- ---      Refill BD Pen Needle Short U/F Miscellaneous, 31G X 8 MM, SQ, 4 boxes, as  directed, 4 times a day, 90 days, Refills=4          * * *         **eMessages**   From:   [name]    --- ---    Created:   2017-05-03 12:55:56    Sent:   2017-05-03 12:55:58    Subject:   RE:Rx for Humalog    Message:                      [name] , MD 05/03/2017 12:54:00 PM >    [name],    I sent the humalog pen and needles to the pharmacy. Not sure what happened,  but hopefully it will be filled today. Let me know if you need anything.    - Dr. Seth Bake.                      * * *        ---        Reason for Appointment    ---      1\. Rx for Humalog     ---         **Medical History:**   Diabetes .    ---    Chronic hypertension.    ---  Seizures as a child.    ---      Allergies    ---      Penicillin: rash    ---      Assessments    ---    1\. Pre-existing type 2 diabetes mellitus during pregnancy in second trimester  - O24.112    ---      Treatment    ---       **1\. Pre-existing type 2 diabetes mellitus during pregnancy in second  trimester**    Refill Humalog KwikPen Solution Pen-injector, 100 UNIT/ML, 3 units as directed  (max 15 units daily), Subcutaneous, with meals, 90 days, 1 box, Refills 4    Refill BD Pen Needle Short U/F Miscellaneous, 31G X 8 MM, as directed, SQ, 4  times a day, 90 days, 4 boxes, Refills 4    ---          * * *          PatientFAREN, [name] DOB: [birth year] Provider: [name], MD  05/02/2017    ---    Note generated by eClinicalWorks EMR/PM Software ([email or web address])

## 2017-05-03 ENCOUNTER — Ambulatory Visit

## 2017-05-03 ENCOUNTER — Ambulatory Visit: Admitting: Internal Medicine

## 2017-05-03 ENCOUNTER — Ambulatory Visit: Admit: 2017-05-03 | Payer: HMO

## 2017-05-03 LAB — HX POINT OF CARE: HX HGB A1C, POC: 6.3 % — ABNORMAL HIGH

## 2017-05-03 MED ORDER — INSULIN LISPRO: 1 | 9 | 4 refills | 0 days

## 2017-05-03 MED ORDER — BD Pen Needle Short U/F: boxes | Freq: Four times a day (QID) | 4 refills | 0 days | Status: AC

## 2017-05-03 MED ORDER — Humalog KwikPen: 100 | box | 4 refills | 0 days | Status: AC

## 2017-05-03 NOTE — Progress Notes (Signed)
[facility] May 03, 2017  [address]  [place] [address]  Main: [phone]  Fax:   Patient Portal: [email or web address]                OPHTHALMOLOGY APPOINTMENT REQUEST   Email to [email or web address]      Appointment Requested By:  Coralyn Pear    Date of Request:05/03/2017    Extension for Individual Requesting Appt: [phone]    Referring Provider Name:Janeane Cozart, MD    Referring Provider Extension: [phone]      Patient Name:[name]    Patient Medical Record #: [id number]    Patient DOB:03-22-1976    Patient Telephone Number:[phone]    Patient Address:[address]  [place], [place]  [address]    Patient Insurance ID:B71 [facility] PCP    [id number]        Interpreter Needed? YES  NO x Language?    Reason for Referral Routine  Diabetic x Other    Special Request for Time? Next Available   Special Request for Location?          *** Ophthalmology Use Only ****    Date:   Time:       Doctor:        Interpreter Booked: YES      NO        Created By Coralyn Pear on 05/03/2017 at 09:32 AM    Electronically Signed By [name] MD on 05/03/2017 at 12:21 PM

## 2017-05-03 NOTE — Progress Notes (Signed)
General Medicine Visit    Service Due by Standard Protocol Rules: DIAB EYE EX, HGBA1C.      Initial Screening   * [place]: Perito (Tina)  Ht: 64.5 in.  Wt: 219 lbs.   BMI: 37.14  Temp: 97.2 deg F.     BP (Initial [place] Screening): 114 / 74      BP (Rechecked, Actionable): 114 / 74 mmHg   HR: 85    O2 Sat: 95 %    Med List: PRINTED by [place] for patient   Smoking Assessment: Tobacco use? never smoker  HCP materials? Printed        Falls Risk Assessment:   In the past year, have you ... Had no falls  Difficulty with balance? NO  Need assistance with ambulation while here? NO  Comments: ......................................Marland KitchenCyndia Skeeters  May 03, 2017 9:05 AM        Basic Visit  Chief Complaint:   41 year old here for f/u mmp     History of Present Illness:   Pregnancy: now at 21 weeks ;  saw Dr [name] Aris 5/22; has ultrasound/appt coming up in July      DM: no lows   saw Dr [name] last week; rec'd decrease lantus to 35 units   a1c today is 6.2!!!!!   weight up 3 lbs from last time     Depression: denies. things ok with father of baby, he's ectastic. sleep is ok, eating ok. is calm.                   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  Health Care Proxy: Mom  Tobacco:none  Alcohol: 3/week; no alcohol now   Drugs:MJ, 4 days/week; 3 joints in a day; no MJ now    Diet:diabetic "I need to lose weight"; 24 hour recall; AM sausage, eggs, one piece toast; water; no lunch yet; dinner last night-turkey sub, sometimes chips at work; sweets-few days/week  Exercise: could do more; now and then  Car safety-seatbelts/texting:discussed    Partner: has  boyfriend now; using condoms 90%  DV:no  STI history:    gc, chlamydia, hpv, herpes                    HIV testing:2016 tested  Contraception: condoms, gyn at [facility] upcoming to discuss reinsert  iud   Abnormal pap history: 9/10 ascus; colpo neg; 12/11 colpo lsil; 6/12 ascus, hpv not done; 4/13 pap neg, hr hpv pos, colpo neg; 5/14 pap/hpv neg;  pap May 2015-neg pap, did not receive hpv results    Last CE: 5/15; 1/17; 4/18  Pap: 5/15 pap neg (HPV sent but I didn't get result); pt prefers gyn  Mammo:ni; 8/17 on mammo van  Colo:ni  BMD:ni  Eye care:10/15;9/16, wants 6 month f/u   Dental: utd  Choleseterol:11/15 LDL over 100, rec'd statin  Risk Score Cholesterol: on high intensity statin  Tdap: 2012  MMR: vax 1978, 1994  Varicella:kid  Hep B:3 vax  Pvax:2011  Flu 2015, 2016  Zoster:ni  HPV:ni        breathing is at baseline      Past Medical History (prior to today's visit):  AMENORRHEA (ICD-626.0) (ICD10-N91.2)  HIDRADENITIS (ICD-705.83) (ICD10-L73.2)  DIABETES MELLITUS, TYPE  II, 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) (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)  HYPERTENSION (ICD-401.9) (ICD10-I10)  SHORTNESS OF BREATH (ICD-786.05) (ICD10-R06.02)  ANNUAL EXAM (ICD-V72.31) (ICD10-Z00.00)  DEPRESSION (ICD-311) (ICD10-F32.9)      ANXIETY (ICD-300.00) (ICD10-F41.9)  ABNORMAL PAP SMEAR 12/11 LSIL; COLPO 12/11; BX CIN I; 6/12 ASCUS PAP; 4/13 PAP NEG; HR HPV POS, NEG COLPO; 5/14 PAP/HR HPV NEG (ICD-795.00) (ICD10-R87.619)  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)  PATELLAR DISLOCATION-MEDIAL RETINACULAR TEAR; RIGHT KNEE (ICD-836.3) (ICD10-S83.006)  OBESITY (ICD-278.00) (ICD10-E66.9)  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)  SEXUALLY TRANSMITTED DISEASE, EXPOSURE TO-H/O GC, TRICH, HPV, CHLAMYDIA (ICD-V01.6) (ICD10-Z20.2)  S/P CHOLECYSTECTOMY (ICD-V45.79) (ICD10-Z90.49)           Medications (prior to today's visit):  LANTUS 100 UNIT/ML SUBCUTANEOUS SOLUTION (INSULIN GLARGINE) 40 units  udner skin daily  ATORVASTATIN CALCIUM 40 MG ORAL TABLET (ATORVASTATIN CALCIUM) Take one tablet by mouth once daily  GNP PRENATAL VITAMINS 28-0.8 MG ORAL TABLET (PRENATAL VIT-FE FUMARATE-FA) take one by mouth daily  NIFEDIPINE ER 60 MG ORAL TABLET EXTENDED RELEASE  24 HOUR (NIFEDIPINE) Take one tablet by mouth once daily      * ACCUCHECK GLUCOMETER check sugars three times daily Dx: Diabetes type II with retinopathy E11.319      RELION ALL-IN-ONE DEVICE (BLOOD GLUC METER DISP-STRIPS) Use three times daily (whichever brand covered/with meter)      LANCETS (LANCETS) Use as directed.      INSULIN SYRINGE 30G X 1/2" 0.5 ML (INSULIN SYRINGE-NEEDLE U-100) use to inject insulin daily under skin      BD DISP NEEDLES 30G X 1/2" (NEEDLE (DISP)) use small needle to inject insulin      ACCU-CHEK AVIVA IN VITRO STRIP (GLUCOSE BLOOD) use 1-3 times a day   dx E11 .319    Medications (after today's visit):  LANTUS 100 UNIT/ML SUBCUTANEOUS SOLUTION (INSULIN GLARGINE) 35 units  udner skin daily  GNP PRENATAL VITAMINS 28-0.8 MG ORAL TABLET (PRENATAL VIT-FE FUMARATE-FA) take one by mouth daily  HUMALOG KWIKPEN 100 UNIT/ML SUBCUTANEOUS SOLUTION PEN-INJECT (INSULIN LISPRO) Use 3 units before each meal  NIFEDIPINE ER 60 MG ORAL TABLET EXTENDED RELEASE 24 HOUR (NIFEDIPINE) Take one tablet by mouth once daily      * ACCUCHECK GLUCOMETER check sugars three times daily Dx: Diabetes type II with retinopathy E11.319      RELION ALL-IN-ONE DEVICE (BLOOD GLUC METER DISP-STRIPS) Use three times daily (whichever brand covered/with meter)      LANCETS (LANCETS) Use as directed.      INSULIN SYRINGE 30G X 1/2" 0.5 ML (INSULIN SYRINGE-NEEDLE U-100) use to inject insulin daily under skin      BD DISP NEEDLES 30G X 1/2" (NEEDLE (DISP)) use small needle to inject insulin      ACCU-CHEK AVIVA IN VITRO STRIP (GLUCOSE BLOOD) use 1-3 times a day   dx E11 .319           Allergies (prior to today's visit):  PENICILLIN V POTASSIUM (PENICILLIN V POTASSIUM) (Critical)                 Vitals:   Ht: 64.5 in.  Wt: 219 lbs.  BMI (in-lb) 37.14  Temp: 97.2deg F.     BP (Initial [place] Screening): 114 / 74     BP (Rechecked, Actionable): 114 / 74 mmHg   Pulse Rate: 85 bpm O2 Sat: 95 %      Additional PE:   General: Well  appearing  Head: NCAT  Gait: normal  Eyes-anicteric  Neck: supple  Psych: affect and mood appropriate  Assessment & Plan:   41 year old diabetic, htn and depression here for f/u pregnancy      Pregnancy--connnected with MFM, has appt next month ; on pnv   stopped all alcohol and MJ    SOB: ddx includes CHF, CAD, lung disease (clear lungs on exam) post nasal drip and GERD, deconditionising ; tried flonase for pnd and zantac for gerd, no change . Sx stable x 6 months. Stress echo 6 METS only but normal (no ischemia, good echo, good fn and got dyspnic on the test, cxr neg; likely deconditioning    DM;  wow, a1c 6/18 is now 6.2; no lows; seeing Dr [name] and switching to less lantus with 3 units humalog with each meal            missed eye care--will refer           was on  atorvastatin 40 mg (LDL 117) but hold for now/pregnancy              2. 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 Monitoring with eye care,  book now     4. depression/reactivity/irritability  -stopped sertraline, currently improved       Likely PCOS (advised weight loss)  Likely hidradenitis-no active boils now; use antibacterial and soaks; consider cleocin gel if continued     pvax in future   last pap 5/15 (got result but not HPV results) She will check in gyn at [facility] when next is due       Orders (this visit):  [facility] [Ref-Eye]  RTC in 3 months [RTC-090]  Est Level 3 [CPT-99213]    Patient Care Plan      Immunization Worksheet 2016           Created By Cyndia Skeeters on 05/03/2017 at 08:58 AM    Electronically Signed By [name] MD on 05/03/2017 at 09:21 AM

## 2017-05-03 NOTE — Progress Notes (Signed)
General Medicine Visit  .  Service Due by Standard Protocol Rules: DIAB EYE EX, HGBA1C.    .  Initial Screening   * [place]: Perito (Tina)  Ht: 64.5 in.  Wt: 219 lbs.   BMI: 37.14  Temp: 97.2 deg F.     BP (Initial [place] Screening): 114 / 74      BP (Rechecked, Actionable): 114 /   74 mmHg   HR: 85    O2 Sat: 95 %  .  Med List: PRINTED byfor patient   Smoking Assessment: Tobacco use? never smoker  HCP materials? Printed  .  .  .  Falls Risk Assessment:   In the past year, have you ... Had no falls  Difficulty with balance? NO  Need assistance with ambulation while here? NO  Comments: ......................................Marland KitchenCyndia Skeeters  May 03, 2017   9:05 AM  .  .  .  Basic Visit  Chief Complaint:   41 year old here for f/u mmp   .  History of Present Illness:   Pregnancy: now at 21 weeks ;  saw Dr [name] Aris 5/22; has ultrasound/appt comin  g up in July  .  [name]  DM: no lows   saw Dr [name] last week; rec'd decrease lantus to 35 units   a1c today is 6.2!!!!!   weight up 3 lbs from last time   .  Depression: denies. things ok with father of baby, he's ectastic. sleep is   ok, eating ok. is calm.  .  .  .  .  .  .  .  Social History:   [place] the projects; dad lived with them off and on; lived with mom, br  other;there was a time dad was cheating;   .  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  Diet:diabetic "I need to lose weight"; 24 hour recall; AM sausage, eggs, on  e piece toast; water; no lunch yet; dinner last night-turkey sub, sometimes   chips at work; sweets-few days/week  Exercise: could do more; now and then  Car safety-seatbelts/texting:discussed  .  Partner: has  boyfriend now; using condoms 90%  DV:no  STI history:    gc, chlamydia, hpv, herpes                    HIV testing:2016 tested  Contraception: condoms, gyn at  [facility] upcoming to discuss reinsert iud   Abnormal pap history: 9/10 ascus; colpo neg; 12/11 colpo lsil; 6/12 ascus,   hpv not done; 4/13 pap neg, hr hpv pos, colpo neg; 5/14 pap/hpv neg;  pap M  ay 2015-neg pap, did not receive hpv results  .  Last CE: 5/15; 1/17; 4/18  Pap: 5/15 pap neg (HPV sent but I didn't get result); pt prefers gyn  Mammo:ni; 8/17 on mammo van  Colo:ni  BMD:ni  Eye care:10/15;9/16, wants 6 month f/u   Dental: utd  Choleseterol:11/15 LDL over 100, rec'd statin  Risk Score Cholesterol: on high intensity statin  Tdap: 2012  MMR: vax 1978, 1994  Varicella:kid  Hep B:3 vax  Pvax:2011  Flu 2015, 2016  Zoster:ni  HPV:ni  .  .  .  breathing is at baseline  .  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  .  .  Medications (prior to today's visit):  LANTUS 100 UNIT/ML SUBCUTANEOUS SOLUTION (INSULIN GLARGINE) 40 units  udner   skin daily  ATORVASTATIN CALCIUM 40 MG ORAL TABLET (ATORVASTATIN CALCIUM) Take one tabl  et by mouth once daily  GNP PRENATAL VITAMINS 28-0.8 MG ORAL TABLET (PRENATAL VIT-FE FUMARATE-FA) t  ake one by  mouth daily  NIFEDIPINE ER 60 MG ORAL TABLET EXTENDED RELEASE 24 HOUR (NIFEDIPINE) Take   one tablet by mouth once daily      * ACCUCHECK GLUCOMETER check sugars three times daily Dx: Diabetes type   II with retinopathy E11.319      RELION ALL-IN-ONE DEVICE (BLOOD GLUC METER DISP-STRIPS) Use three times   daily (whichever brand covered/with meter)      LANCETS (LANCETS) Use as directed.      INSULIN SYRINGE 30G X 1/2" 0.5 ML (INSULIN SYRINGE-NEEDLE U-100) use to   inject insulin daily under skin      BD DISP NEEDLES 30G X 1/2" (NEEDLE (DISP)) use small needle to inject i  nsulin      ACCU-CHEK AVIVA IN VITRO STRIP (GLUCOSE BLOOD) use 1-3 times a day   dx   E11 .319  .  Medications (after today's visit):  LANTUS 100 UNIT/ML SUBCUTANEOUS SOLUTION (INSULIN GLARGINE) 35 units  udner   skin daily  GNP PRENATAL VITAMINS 28-0.8 MG ORAL TABLET (PRENATAL VIT-FE FUMARATE-FA) t  ake one by mouth daily  HUMALOG KWIKPEN 100 UNIT/ML SUBCUTANEOUS SOLUTION PEN-INJECT (INSULIN LISPR  O) Use 3 units before each meal  NIFEDIPINE ER 60 MG ORAL TABLET EXTENDED RELEASE 24 HOUR (NIFEDIPINE) Take   one tablet by mouth once daily      * ACCUCHECK GLUCOMETER check sugars three times daily Dx: Diabetes type   II with retinopathy E11.319      RELION ALL-IN-ONE DEVICE (BLOOD GLUC METER DISP-STRIPS) Use three times   daily (whichever brand covered/with meter)      LANCETS (LANCETS) Use as directed.      INSULIN SYRINGE 30G X 1/2" 0.5 ML (INSULIN SYRINGE-NEEDLE U-100) use to   inject insulin daily under skin      BD DISP NEEDLES 30G X 1/2" (NEEDLE (DISP)) use small needle to inject i  nsulin      ACCU-CHEK AVIVA IN VITRO STRIP (GLUCOSE BLOOD) use 1-3 times a day   dx   E11 .319  .  .  .  Allergies (prior to today's visit):  PENICILLIN V POTASSIUM (PENICILLIN V POTASSIUM) (Critical)  .  .  .  .  .  .  Vitals:   Ht: 64.5 in.  Wt: 219 lbs.  BMI (in-lb) 37.14  Temp: 97.2deg F.     BP (Initial [place] Screening): 114 / 74  BP (Rechecked, Actionable):  114 / 7  4 mmHg   Pulse Rate: 85 bpm O2 Sat: 95 %  .  [name]  Additional PE:   General: Well appearing  Head: NCAT  Gait: normal  Eyes-anicteric  Neck: supple  Psych: affect and mood appropriate  .  .  .  Assessment /T/ Plan:   41 year old diabetic, htn and depression here for f/u pregnancy  .  [name]  Pregnancy--connnected with MFM, has appt next month ; on pnv   stopped all alcohol and MJ  .  SOB: ddx includes CHF, CAD, lung disease (clear lungs on exam) post nasal d  rip and GERD, deconditionising ; tried flonase for pnd and zantac for gerd,   no change . Sx stable x 6 months. Stress echo 6 METS only but normal (no i  schemia, good echo, good fn and got dyspnic on the test, cxr neg; likely de  conditioning  .  DM;  wow, a1c 6/18 is now 6.2; no lows; seeing Dr [name] and switching   to less lantus with 3 units humalog with each meal            missed eye care--will refer           was on  atorvastatin 40 mg (LDL 117) but hold for now/pregnancy  2. bp controlled; changed to nifedipine for pregnancy now controlled   .  .  .  .  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 Monitoring wit  h eye care,  book now   .  4. depression/reactivity/irritability  -stopped sertraline, currently impro  ved   .  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  [name]  Orders (this visit):  [facility] [Ref-Eye]  RTC in 3 months [RTC-090]  Est Level 3 [CPT-99213]  .  Patient Care Plan  .  [name]  Immunization Worksheet 2016   .  .  [name]  Electronically Signed by [name], MD on 05/03/2017 at 9:21 AM  ________________________________________________________________________

## 2017-05-03 NOTE — Progress Notes (Signed)
General Medicine Visit  .  Service Due by Standard Protocol Rules: DIAB EYE EX, HGBA1C.    .  Initial Screening   * [place]: Perito (Tina)  Ht: 64.5 in.  Wt: 219 lbs.   BMI: 37.14  Temp: 97.2 deg F.     BP (Initial [place] Screening): 114 / 74      BP (Rechecked, Actionable): 114 /   74 mmHg   HR: 85    O2 Sat: 95 %  .  Med List: PRINTED by [place] for patient   Smoking Assessment: Tobacco use? never smoker  HCP materials? Printed  .  .  .  Falls Risk Assessment:   In the past year, have you ... Had no falls  Difficulty with balance? NO  Need assistance with ambulation while here? NO  Comments: ......................................Marland KitchenCyndia Skeeters  May 03, 2017   9:05 AM  .  .  .  Basic Visit  Chief Complaint:   41 year old here for f/u mmp   .  History of Present Illness:   Pregnancy: now at 21 weeks ;  saw Dr [name] Aris 5/22; has ultrasound/appt comin  g up in July  .  [name]  DM: no lows   saw Dr [name] last week; rec'd decrease lantus to 35 units   a1c today is 6.2!!!!!   weight up 3 lbs from last time   .  Depression: denies. things ok with father of baby, he's ectastic. sleep is   ok, eating ok. is calm.  .  .  .  .  .  .  .  Social History:   [place] the projects; dad lived with them off and on; lived with mom, br  other;there was a time dad was cheating;   .  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  Diet:diabetic "I need to lose weight"; 24 hour recall; AM sausage, eggs, on  e piece toast; water; no lunch yet; dinner last night-turkey sub, sometimes   chips at work; sweets-few days/week  Exercise: could do more; now and then  Car safety-seatbelts/texting:discussed  .  Partner: has  boyfriend now; using condoms 90%  DV:no  STI history:    gc, chlamydia, hpv, herpes                    HIV testing:2016 tested  Contraception: condoms, gyn at  [facility] upcoming to discuss reinsert iud   Abnormal pap history: 9/10 ascus; colpo neg; 12/11 colpo lsil; 6/12 ascus,   hpv not done; 4/13 pap neg, hr hpv pos, colpo neg; 5/14 pap/hpv neg;  pap M  ay 2015-neg pap, did not receive hpv results  .  Last CE: 5/15; 1/17; 4/18  Pap: 5/15 pap neg (HPV sent but I didn't get result); pt prefers gyn  Mammo:ni; 8/17 on mammo van  Colo:ni  BMD:ni  Eye care:10/15;9/16, wants 6 month f/u   Dental: utd  Choleseterol:11/15 LDL over 100, rec'd statin  Risk Score Cholesterol: on high intensity statin  Tdap: 2012  MMR: vax 1978, 1994  Varicella:kid  Hep B:3 vax  Pvax:2011  Flu 2015, 2016  Zoster:ni  HPV:ni  .  .  .  breathing is at baseline  .  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  .  .  Medications (prior to today's visit):  LANTUS 100 UNIT/ML SUBCUTANEOUS SOLUTION (INSULIN GLARGINE) 40 units  udner   skin daily  ATORVASTATIN CALCIUM 40 MG ORAL TABLET (ATORVASTATIN CALCIUM) Take one tabl  et by mouth once daily  GNP PRENATAL VITAMINS 28-0.8 MG ORAL TABLET (PRENATAL VIT-FE FUMARATE-FA) t  ake one by  mouth daily  NIFEDIPINE ER 60 MG ORAL TABLET EXTENDED RELEASE 24 HOUR (NIFEDIPINE) Take   one tablet by mouth once daily      * ACCUCHECK GLUCOMETER check sugars three times daily Dx: Diabetes type   II with retinopathy E11.319      RELION ALL-IN-ONE DEVICE (BLOOD GLUC METER DISP-STRIPS) Use three times   daily (whichever brand covered/with meter)      LANCETS (LANCETS) Use as directed.      INSULIN SYRINGE 30G X 1/2" 0.5 ML (INSULIN SYRINGE-NEEDLE U-100) use to   inject insulin daily under skin      BD DISP NEEDLES 30G X 1/2" (NEEDLE (DISP)) use small needle to inject i  nsulin      ACCU-CHEK AVIVA IN VITRO STRIP (GLUCOSE BLOOD) use 1-3 times a day   dx   E11 .319  .  Medications (after today's visit):  LANTUS 100 UNIT/ML SUBCUTANEOUS SOLUTION (INSULIN GLARGINE) 35 units  udner   skin daily  GNP PRENATAL VITAMINS 28-0.8 MG ORAL TABLET (PRENATAL VIT-FE FUMARATE-FA) t  ake one by mouth daily  HUMALOG KWIKPEN 100 UNIT/ML SUBCUTANEOUS SOLUTION PEN-INJECT (INSULIN LISPR  O) Use 3 units before each meal  NIFEDIPINE ER 60 MG ORAL TABLET EXTENDED RELEASE 24 HOUR (NIFEDIPINE) Take   one tablet by mouth once daily      * ACCUCHECK GLUCOMETER check sugars three times daily Dx: Diabetes type   II with retinopathy E11.319      RELION ALL-IN-ONE DEVICE (BLOOD GLUC METER DISP-STRIPS) Use three times   daily (whichever brand covered/with meter)      LANCETS (LANCETS) Use as directed.      INSULIN SYRINGE 30G X 1/2" 0.5 ML (INSULIN SYRINGE-NEEDLE U-100) use to   inject insulin daily under skin      BD DISP NEEDLES 30G X 1/2" (NEEDLE (DISP)) use small needle to inject i  nsulin      ACCU-CHEK AVIVA IN VITRO STRIP (GLUCOSE BLOOD) use 1-3 times a day   dx   E11 .319  .  .  .  Allergies (prior to today's visit):  PENICILLIN V POTASSIUM (PENICILLIN V POTASSIUM) (Critical)  .  .  .  .  .  .  Vitals:   Ht: 64.5 in.  Wt: 219 lbs.  BMI (in-lb) 37.14  Temp: 97.2deg F.     BP (Initial [place] Screening): 114 / 74  BP (Rechecked, Actionable):  114 / 7  4 mmHg   Pulse Rate: 85 bpm O2 Sat: 95 %  .  [name]  Additional PE:   General: Well appearing  Head: NCAT  Gait: normal  Eyes-anicteric  Neck: supple  Psych: affect and mood appropriate  .  .  .  Assessment /T/ Plan:   41 year old diabetic, htn and depression here for f/u pregnancy  .  [name]  Pregnancy--connnected with MFM, has appt next month ; on pnv   stopped all alcohol and MJ  .  SOB: ddx includes CHF, CAD, lung disease (clear lungs on exam) post nasal d  rip and GERD, deconditionising ; tried flonase for pnd and zantac for gerd,   no change . Sx stable x 6 months. Stress echo 6 METS only but normal (no i  schemia, good echo, good fn and got dyspnic on the test, cxr neg; likely de  conditioning  .  DM;  wow, a1c 6/18 is now 6.2; no lows; seeing Dr [name] and switching   to less lantus with 3 units humalog with each meal            missed eye care--will refer           was on  atorvastatin 40 mg (LDL 117) but hold for now/pregnancy  2. bp controlled; changed to nifedipine for pregnancy now controlled   .  .  .  .  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 Monitoring wit  h eye care,  book now   .  4. depression/reactivity/irritability  -stopped sertraline, currently impro  ved   .  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  [name]  Orders (this visit):  [facility] [Ref-Eye]  RTC in 3 months [RTC-090]  Est Level 3 [CPT-99213]  .  Patient Care Plan  .  [name]  Immunization Worksheet 2016   .  [name]  Electronically Signed by [name], MD on 05/03/2017 at 9:21 AM  ________________________________________________________________________  Division of General Medicine - Additional Orders  .  [name]  Orders   .  Albumin, Random Urine   [CPT-82043]  .  .  .  Electronically Signed by [name], MD on 05/03/2017 at 9:22 AM  ________________________________________________________________________

## 2017-05-03 NOTE — Progress Notes (Signed)
Checkout  Return to Clinic 3 months  Appointment Made Yes  RTC Date: 08/21/2017  Time: 4:00pm          Created By Coralyn Pear on 05/03/2017 at 09:31 AM    Electronically Signed By Coralyn Pear on 05/03/2017 at 09:31 AM

## 2017-05-04 MED ORDER — FreeStyle Libre Reader: 1 | kit | 3 refills | 0 days | Status: AC

## 2017-05-04 MED ORDER — FreeStyle Libre Sensor System: 1 | 9 | 4 refills | 0 days | Status: AC

## 2017-05-17 ENCOUNTER — Ambulatory Visit: Admitting: Internal Medicine

## 2017-05-22 ENCOUNTER — Ambulatory Visit

## 2017-05-22 ENCOUNTER — Ambulatory Visit: Admitting: Maternal & Fetal Medicine

## 2017-05-22 ENCOUNTER — Ambulatory Visit: Admit: 2017-05-22 | Payer: HMO

## 2017-05-22 NOTE — Progress Notes (Signed)
**Progress Notes**    ---    **Patient:** [name], SPITLERAccount Number:** [id number] **External MRN:** [id number]   **Provider:** Elnora Morrison, MD     **DOB:** [birth year] **Age:** 40 Y **Sex:** Female   **Date:** 05/22/2017     **Phone:** [phone]   **CHN#:** [id number]     **Address:** [address], [place], [place]     **Pcp:** [name]        * * *         **Subjective:**        ---       **Chief Complaints:**    --- ---         --- ---      **Medical History:** Diabetes , Chronic hypertension, Seizures as a child.        --- ---      **Gyn History:** Hx Abnormal pap smear yes, with biopsy in past but no  treatment. Last pap 2015.. Menstrual History LMP 12/05/2016. STD  HPV/condyloma, HSV/Herpes - outbreaks rare. Pregnancy #1 Date- /2006, Del-  TAB, Comments- No complications.    --- ---      **OB History:** Previous Pregnancies Total Pregnancies: 2, Full Term: 0,  Premature: 0, AB. Induced: 1, AB. Spontaneous: 0, Ectopics: 0, Multiple  Births: 0, Living: 0\.    --- ---      **Medications:** Taking Accu-Chek Aviva-Blood Glucose Test Strips 1 as  directed SQ 8 times per day, Taking Accu-Chek Softclix Lancets 1 Miscellaneous  as directed SQ 6 times per day, Taking Aspirin Low Dose 81 MG Tablet Chewable  1 tablet Orally Once a day, Taking BD Pen Needle Short U/F 31G X 8 MM  Miscellaneous as directed SQ 4 times a day, Taking Colace 100 MG Capsule 1  capsule as needed Orally Once a day, Taking Humalog KwikPen 100 UNIT/ML  Solution Pen-injector 3 units as directed (max 15 units daily) Subcutaneous  with meals, Taking Lantus 100 UNIT/ML Solution 35 units (max of 45)  Subcutaneous Once a day, Taking NIFEdipine ER 60 MG Tablet Extended Release 24  Hour 1 tablet on an empty stomach Orally Once a day, Taking Prenatal    --- ---      **Allergies:** Penicillin: rash.    --- ---        **Objective:**        ---       **Vitals:** Pain scale 0/10, Wt-lbs 225.5, BP UE 127, BP LE 67.    --- ---          **Assessment:**        ---       **Assessment:**    1\. Insulin controlled gestational diabetes mellitus (GDM) during pregnancy,  antepartum - O24.414 (Primary)    2\. Obesity (BMI 30.0-34.9) - E66.9    3\. Maternal chronic hypertension in second trimester - O10.912    4\. Elderly multigravida in second trimester - O09.522    5\. Pre-existing type 2 diabetes mellitus during pregnancy in second trimester  - O24.112    --- ---        **Plan:**        ---         **1\. Maternal chronic hypertension in second trimester**    Continue Aspirin Low Dose Tablet Chewable, 81 MG, 1 tablet, Orally, Once a day  .    ---    **2\. Pre-existing type 2 diabetes mellitus during pregnancy in second  trimester**  Continue Accu-Chek Aviva-Blood Glucose Test Strips, 1, as directed, SQ, 8  times per day ; Continue Accu-Chek Softclix Lancets Miscellaneous, 1, as  directed, SQ, 6 times per day ; Continue Lantus Solution, 100 UNIT/ML, 35  units (max of 45), Subcutaneous, Once a day ; Continue Humalog KwikPen  Solution Pen-injector, 100 UNIT/ML, 3 units as directed (max 15 units daily),  Subcutaneous, with meals ; Continue BD Pen Needle Short U/F Miscellaneous, 31G  X 8 MM, as directed, SQ, 4 times a day .    **3\. Others**    Continue Colace Capsule, 100 MG, 1 capsule as needed, Orally, Once a day ;  Continue NIFEdipine ER Tablet Extended Release 24 Hour, 60 MG, 1 tablet on an  empty stomach, Orally, Once a day ; Continue Prenatal .      **Procedure Codes:** [id number] PRENATAL VISIT    --- ---       **Follow Up:** 4 Weeks    --- ---    ---    ---          **Provider:** Elnora Morrison, MD    ---     **Patient:** Lachney, Jona **DOB:** 02-02-1976 **Date:** 05/22/2017    ---    Electronically signed by Marlowe Kays , MD on 05/22/2017 at 04:52 PM EDT    Sign off status: Completed

## 2017-05-22 NOTE — Progress Notes (Signed)
.  Progress Notes  .  Patient: [name], [name]  Provider: Marlowe Kays D   .  DOB: [birth year] Age: 41 Y Sex: Female  .  PCP: [name]   Date: 05/22/2017  .  --------------------------------------------------------------------------------  .  CURRENT MEDICATIONS  .  Taking Accu-Chek Aviva-Blood Glucose Test Strips 1 as directed SQ  8 times per day, Taking Accu-Chek Softclix Lancets 1  Miscellaneous as directed SQ 6 times per day, Taking Aspirin Low  Dose 81 MG Tablet Chewable 1 tablet Orally Once a day, Taking BD  Pen Needle Short U/F 31G X 8 MM Miscellaneous as directed SQ 4  times a day, Taking Colace 100 MG Capsule 1 capsule as needed  Orally Once a day, Taking Humalog KwikPen 100 UNIT/ML Solution  Pen-injector 3 units as directed (max 15 units daily)  Subcutaneous with meals, Taking Lantus 100 UNIT/ML Solution 35  units (max of 45) Subcutaneous Once a day, Taking NIFEdipine ER  60 MG Tablet Extended Release 24 Hour 1 tablet on an empty  stomach Orally Once a day, Taking Prenatal  .  PAST MEDICAL HISTORY  .  Diabetes , Chronic hypertension, Seizures as a child.  .  ALLERGIES  .  Penicillin: rash.  .  GYN HISTORY  .  Hx Abnormal pap smear yes, with biopsy in past but no treatment.  Last pap 2015.  Menstrual History LMP 12/05/2016  STD HPV/condyloma, HSV/Herpes - outbreaks rare  Pregnancy #1 Date- /2006, Del- TAB, Comments- No complications  .  OB HISTORY  .  Previous Pregnancies Total Pregnancies: 2, Full Term: 0,  Premature: 0, AB. Induced: 1, AB. Spontaneous: 0, Ectopics: 0,  Multiple Births: 0, Living: 0  .  VITAL SIGNS  .  Pain scale 0/10, Wt-lbs 225.5, BP UE 127, BP LE 67.  .  ASSESSMENTS  .  Insulin controlled gestational diabetes mellitus (GDM) during  pregnancy, antepartum - O24.414 (Primary)  .  Obesity (BMI 30.0-34.9) - E66.9  .  Maternal chronic hypertension in second trimester - O10.912  .  Elderly multigravida in second trimester - O09.522  .  Pre-existing type 2 diabetes mellitus during  pregnancy in second  trimester - O24.112  .  TREATMENT  .  Maternal chronic hypertension in second trimester  Continue Aspirin Low Dose Tablet Chewable, 81 MG, 1 tablet,  Orally, Once a day  .  [name]  Pre-existing type 2 diabetes mellitus during  pregnancy in second trimester  Continue Accu-Chek Aviva-Blood Glucose Test Strips, 1, as  directed, SQ, 8 times per day  Continue Accu-Chek Softclix Lancets Miscellaneous, 1, as  directed, SQ, 6 times per day  Continue Lantus Solution, 100 UNIT/ML, 35 units (max of 45),  Subcutaneous, Once a day  Continue Humalog KwikPen Solution Pen-injector, 100 UNIT/ML, 3  units as directed (max 15 units daily), Subcutaneous, with meals  Continue BD Pen Needle Short U/F Miscellaneous, 31G X 8 MM, as  directed, SQ, 4 times a day  .  [name]  Others  Continue Colace Capsule, 100 MG, 1 capsule as needed, Orally,  Once a day  Continue NIFEdipine ER Tablet Extended Release 24 Hour, 60 MG, 1  tablet on an empty stomach, Orally, Once a day  Continue Prenatal  .  PROCEDURE CODES  .  56213 PRENATAL VISIT  .  FOLLOW UP  .  4 Weeks  .  Electronically signed by Marlowe Kays , MD on  05/22/2017 at 04:52 PM EDT  .  Document electronically signed by [facility],  SABRINA D   .

## 2017-06-06 ENCOUNTER — Ambulatory Visit: Admitting: Ophthalmology

## 2017-06-07 ENCOUNTER — Ambulatory Visit

## 2017-06-07 ENCOUNTER — Ambulatory Visit: Admitting: Internal Medicine

## 2017-06-07 ENCOUNTER — Ambulatory Visit: Admit: 2017-06-07 | Payer: HMO

## 2017-06-07 MED ORDER — Accu-Chek Aviva-Blood Glucose Test Strips: 1 | 900 | 3 refills | 0 days | Status: AC

## 2017-06-07 MED ORDER — Accu-Chek Softclix Lancets: 1 | 900 | 3 refills | 0 days | Status: AC

## 2017-06-07 NOTE — Progress Notes (Signed)
.  Progress Notes  .  Patient: [name]  Provider: [name]  MD  .  DOB: Jan 10, 1976 Age: 41 Y Sex: Female  .  PCP: [name]   Date: 06/07/2017  .  --------------------------------------------------------------------------------  .  REASON FOR APPOINTMENT  .  1. NP/DIABETIC RETINOPATHYPER DR.TISHLER  .  2. Diabetes Type 2  .  HISTORY OF PRESENT ILLNESS  .  GENERAL:   41 yo F with history of DM II, HTN, HLD, and currently [redacted] wks  pregnant, who presents for diabetes management during pregnancy.  Patient was diagnosed in her early 20's with type II Diabetes and  was on metformin at some point, but has been on insulin for the  past few years (<1 decade per patient). She has only been on  lantus only. For the most part, patient says that the lantus has  kept her diabetes under good control. Patient says that she has  severe dry eye with blurry vision that's worse after staring at  the computer for too long. Dr. [name] tried to get her to see an  opthalmologist, and she saw one in April, but her eyes were not  dilated for the exam as they found out that she was pregnant.  Patient denies any symptoms of hyperglycemia or hypoglycemia at  home. ---Interval Hx- Labs: not done for today, but recent HbA1c  is 6.3% 05/03/17.- BG numbers: (Accuchek aviva meter) and libre  sensor approved and using since 7/24. Average for last 9 days was  105 mg/dL (  1.6%)- Meds: lantus 35 units Qam, humalog 3 units Qpc  (after meals)- Diet: eating more often and smaller meals more  often. - Activity: walks a couple of times a week for 15-28min  each time and will lift weights at home occasionally.- She is  [redacted]w[redacted]d today (EDD 09/11/17). - Today the patient denies excessive  thirst, polyuria, HA, light headedness, SOB, CP, abdominal pain,  dysphagia, nausea, vomiting, diarrhea, fever or  chills.---HbA1c6.3% 6/28/188.1% 02/13/2017---DietB - mixed  graincheerios with almond milk, boiled eggsL - [place]  sandwich/healthy choice  mealsD - salad/fish/steakS - mixed nuts,  cucumbers, carrots.  .  CURRENT MEDICATIONS  .  Taking Accu-Chek Aviva-Blood Glucose Test Strips 1 as directed SQ  8 times per day  Taking Accu-Chek Softclix Lancets 1 Miscellaneous as directed SQ  6 times per day  Taking Aspirin Low Dose 81 MG Tablet Chewable 1 tablet Orally  Once a day  Taking BD Pen Needle Short U/F 31G X 8 MM Miscellaneous as  directed SQ 4 times a day  Taking Colace 100 MG Capsule 1 capsule as needed Orally Once a  day  Taking Humalog KwikPen 100 UNIT/ML Solution Pen-injector 3 units  as directed (max 15 units daily) Subcutaneous with meals  Taking Lantus 100 UNIT/ML Solution 35 units (max of 45)  Subcutaneous Once a day  Taking NIFEdipine ER 60 MG Tablet Extended Release 24 Hour 1  tablet on an empty stomach Orally Once a day  Taking Prenatal  Medication List reviewed and reconciled with the patient  .  PAST MEDICAL HISTORY  .  Diabetes  Chronic hypertension  Seizures as a child  .  ALLERGIES  .  Penicillin: rash  .  SURGICAL HISTORY  .  lap cholecystectomy 2010  .  FAMILY HISTORY  .  Mother: alive 47 yrs, diagnosed with Diabetes, Hypertension  Father: deceased, diagnosed with Heart Disease in 56  Maternal Grand Mother: alive, diagnosed with Diabetes,  Hypertension  Parnter is 56 and is [place]. No fam hx of congen anomalies, MR,  stillbirth, sickle cell disease. Father- H/o CHFMother - h/o  diabetes and HTN.  .  SOCIAL HISTORY  .  .  Tobacco  history:Never smoked  .  REVIEW OF SYSTEMS  .  A complete review of systems was negative except for those  mentioned in the HPI.  [name]  VITAL SIGNS  .  Pain scale 0, Ht-in 66, Wt-lbs 227, BMI 36.63, BP 123/79, HR 95,  BSA 2.19, O2 99, Ht-cm 167.64, Wt-kg 102.97, Wt Change 1.5 lb.  .  PHYSICAL EXAMINATION  .  Endo: [facility]:  General:  appears well, no distress.  HEENT:  NC/AT, EOMI, no proptosis, no lid lag.  Neck:  no erythema, no tracheal deviation, negative Pemberton's  sign, acanthosis nigricans  noted.  Lungs:  good air entry, clear bilaterally.  Heart:  S1 & S2 normal, regular rate & rhythm, no murmurs, rubs,  or gallops.  Abdomen:  soft, non-tender, non-distended, obese.  Extremities:  no tremor in hands bilaterally, no clubbing,  cyanosis, or edema, good muscle mass.  Neurology:  normal gait, normal 5/5 strength, no proximal  myopathy.  Dermatology:  no skin dryness, anicteric.  Psych:  no anxiety, no depression.  .  ASSESSMENTS  .  Pre-existing type 2 diabetes mellitus during pregnancy in second  trimester - O24.112 (Primary)  .  Obesity (BMI 30.0-34.9) - E66.9  .  Essential hypertension - I10  .  ---1. Diabetes type 2 during pregnancyShe has been doing much  better with her diabetic control as well as eating. She is using  Lantus 35 and Humalog after meals. She will move the Humalog to  before meals which should help greatly with her postprandial  spikes. We also advised to try and use a carb ratio to change the  dosing for the meals. Will use 1:15 ICR which will help with her  smaller snacks that she will eat more often now in the second  trimester. For today:- Continue lantus at 35 units QAM - Continue  humalog at 3 units TID before meals, but she will start to carb  count using 1:15 ICR. - Went over insulin injections and to move  the shots around (can also inject underneath arms or in thighs)-  Continue on the [place] sensors and bring in reader each visit. -  Follow up in 8 weeks with labs at that time. - libre sensor CGM  downloaded and interpreted today to help with insulin titration  and blood sugar control. ---2. Diabetes associated problems- HTN:  continue home nifedipine- HLD: currently holding statin given  pregnancy- Eyes: Vision has improved although I still encouraged  patient to go back to ophtho for proper eye exam - Kidneys:  normal Cr 0.8 in April/2018- Feet: normal foot exam with no loss  of sensation/numbness tingling- CAD: asymptomatic currently,  denies chest pain. Stress ECHO on  02/13/17 negative for signs of  ischemia---3. obesity/nutritionShe continues to have a good diet  currently and exercises most days (lightly). Encouraged to walk  for 3-4 times a week instead of high intensity exercises.  Advised patient to maintain current weight during pregnancy, but  can try to lose weight again after delivery.  .  TREATMENT  .  Pre-existing type 2 diabetes mellitus during  pregnancy in second trimester  Start Accu-Chek Aviva-Blood Glucose Test Strips, 1, as directed,  SQ, 8 times per day, 90 days, 900, Refills 3  Start  Accu-Chek Softclix Lancets Miscellaneous, 1, as directed,  SQ, 6 times per day, 90 days, 900, Refills 3  Notes: 1. Check blood sugars upon waking before meals 1-2 after  meals. Also check before bed this should equate to around 8 times  daily.2. Get lab work before next visit3. Continue Lantus to 35  units4. Continue Humalog 3 units before meals and start to  titrate dose for 1 unit for every 15 grams of carbohydrates.5.  Continue libre sensors and can come in early to upload reader in  between visits.  [name]  PROCEDURE CODES  .  95249 CONTINUOS GLUCOSE MON OFF SUP  .  95251 GLUC MONITOR, CONT, PHYS I&R  .  FOLLOW UP  .  already scheduled  .  Electronically signed by [name] MD on  06/07/2017 at 05:45 PM EDT  .  Document electronically signed by [name]  MD  .

## 2017-06-07 NOTE — Progress Notes (Signed)
* * *        [name]**    --- ---    41 Y old Female, DOB: 04/29/1976, External MRN: [id number]    Account Number: [id number]    [address], [place], [id number]    Home: [phone]    Insurance: HMO [employer]    PCP: [name] Referring: [name]    Appointment Facility: Endocrinology        * * *    06/07/2017  Progress Notes: [name], MD **CHN#:** [id number]    --- ---    ---        Reason for Appointment    ---      1\. NP/DIABETIC RETINOPATHYPER DR.TISHLER    ---    2\. Diabetes Type 2    ---      History of Present Illness    ---     _GENERAL_ :    41 yo F with history of DM II, HTN, HLD, and currently [redacted] wks pregnant, who  presents for diabetes management during pregnancy. Patient was diagnosed in  her early 20's with type II Diabetes and was on metformin at some point, but  has been on insulin for the past few years (<1 decade per patient). She has  only been on lantus only. For the most part, patient says that the lantus has  kept her diabetes under good control.    Patient says that she has severe dry eye with blurry vision that's worse after  staring at the computer for too long. Dr. [name] tried to get her to see an  opthalmologist, and she saw one in April, but her eyes were not dilated for  the exam as they found out that she was pregnant.    Patient denies any symptoms of hyperglycemia or hypoglycemia at home.    ---    Interval Hx    - Labs: not done for today, but recent HbA1c is 6.3% 05/03/17.    - BG numbers: (Accuchek aviva meter) and libre sensor approved and using  since 7/24. Average for last 9 days was 105 mg/dL (~2.4%)    - Meds: lantus 35 units Qam, humalog 3 units Qpc (after meals)    - Diet: eating more often and smaller meals more often.    - Activity: walks a couple of times a week for 15-16min each time and will  lift weights at home occasionally.    - She is [redacted]w[redacted]d today (EDD 09/11/17).    - Today the patient denies excessive thirst,  polyuria, HA, light headedness,  SOB, CP, abdominal pain, dysphagia, nausea, vomiting, diarrhea, fever or  chills.    ---    HbA1c    6.3% 05/03/17    8.1% 02/13/2017    ---    Diet    B - mixed graincheerios with almond milk, boiled eggs    L - [place] sandwich/healthy choice meals    D - salad/fish/steak    S - mixed nuts, cucumbers, carrots.      Current Medications    ---    Taking     * Accu-Chek Aviva-Blood Glucose Test Strips 1 as directed SQ 8 times per day    ---    * Accu-Chek Softclix Lancets 1 Miscellaneous as directed SQ 6 times per day    ---    * Aspirin Low Dose 81 MG Tablet Chewable 1 tablet Orally Once a day    ---    *  BD Pen Needle Short U/F 31G X 8 MM Miscellaneous as directed SQ 4 times a day    ---    * Colace 100 MG Capsule 1 capsule as needed Orally Once a day    ---    * Humalog KwikPen 100 UNIT/ML Solution Pen-injector 3 units as directed (max 15 units daily) Subcutaneous with meals    ---    * Lantus 100 UNIT/ML Solution 35 units (max of 45) Subcutaneous Once a day    ---    * NIFEdipine ER 60 MG Tablet Extended Release 24 Hour 1 tablet on an empty stomach Orally Once a day    ---    * Prenatal     ---    * Medication List reviewed and reconciled with the patient    ---      Past Medical History    ---       Diabetes .        ---    Chronic hypertension.        ---    Seizures as a child.        ---      Surgical History    ---      lap cholecystectomy 2010    ---      Family History    ---      Mother: alive 24 yrs, diagnosed with Diabetes, Hypertension    ---    Father: deceased, diagnosed with Heart Disease in 60    ---    Maternal Grand Mother: alive, diagnosed with Diabetes, Hypertension    ---    Parnter is 29 and is [place]. No fam hx of congen anomalies, MR, stillbirth,  sickle cell disease.    Father- H/o CHF    Mother - h/o diabetes and HTN.    ---      Social History    ---    Tobacco    history: _Never smoked_       Allergies    ---      Penicillin: rash    ---      Review of  Systems    ---    A complete review of systems was negative except for those mentioned in the  HPI.      Vital Signs    ---    Pain scale 0, Ht-in 66, Wt-lbs 227, BMI 36.63, BP 123/79, HR 95, BSA 2.19, O2  99, Ht-cm 167.64, Wt-kg 102.97, Wt Change 1.5 lb.      Physical Examination    ---     _Endo: [facility]roid Clinic_ :    General: appears well, no distress.    HEENT: NC/AT, EOMI, no proptosis, no lid lag.    Neck: no erythema, no tracheal deviation, negative Pemberton's sign,  acanthosis nigricans noted.    Lungs: good air entry, clear bilaterally.    Heart: S1 & S2 normal, regular rate & rhythm, no murmurs, rubs, or gallops.    Abdomen: soft, non-tender, non-distended, obese.    Extremities: no tremor in hands bilaterally, no clubbing, cyanosis, or edema,  good muscle mass.    Neurology: normal gait, normal 5/5 strength, no proximal myopathy.    Dermatology: no skin dryness, anicteric.    Psych: no anxiety, no depression.          Assessments    ---    1\. Pre-existing type 2 diabetes mellitus during pregnancy in second trimester  - O24.112 (Primary)    ---  2\. Obesity (BMI 30.0-34.9) - E66.9    ---    3\. Essential hypertension - I10    ---      \---    1\. Diabetes type 2 during pregnancy    She has been doing much better with her diabetic control as well as eating.  She is using Lantus 35 and Humalog after meals. She will move the Humalog to  before meals which should help greatly with her postprandial spikes. We also  advised to try and use a carb ratio to change the dosing for the meals. Will  use 1:15 ICR which will help with her smaller snacks that she will eat more  often now in the second trimester. For today:    - Continue lantus at 35 units QAM    - Continue humalog at 3 units TID before meals, but she will start to carb  count using 1:15 ICR.    - Went over insulin injections and to move the shots around (can also inject  underneath arms or in thighs)    - Continue on the [place] sensors and bring in  reader each visit.    - Follow up in 8 weeks with labs at that time.    - libre sensor CGM downloaded and interpreted today to help with insulin  titration and blood sugar control.    ---    2\. Diabetes associated problems    - HTN: continue home nifedipine    - HLD: currently holding statin given pregnancy    - Eyes: Vision has improved although I still encouraged patient to go back to  ophtho for proper eye exam    - Kidneys: normal Cr 0.8 in April/2018    - Feet: normal foot exam with no loss of sensation/numbness tingling    - CAD: asymptomatic currently, denies chest pain. Stress ECHO on 02/13/17  negative for signs of ischemia    ---    3\. obesity/nutrition    She continues to have a good diet currently and exercises most days (lightly).  Encouraged to walk for 3-4 times a week instead of high intensity  exercises. Advised patient to maintain current weight during pregnancy, but  can try to lose weight again after delivery.    ---      Treatment    ---       **1\. Pre-existing type 2 diabetes mellitus during pregnancy in second  trimester**    Start Accu-Chek Aviva-Blood Glucose Test Strips, 1, as directed, SQ, 8 times  per day, 90 days, 900, Refills 3    Start Accu-Chek Softclix Lancets Miscellaneous, 1, as directed, SQ, 6 times  per day, 90 days, 900, Refills 3    Notes: 1. Check blood sugars upon waking before meals 1-2 after meals. Also  check before bed this should equate to around 8 times daily.    2\. Get lab work before next visit    3\. Continue Lantus to 35 units    4\. Continue Humalog 3 units before meals and start to titrate dose for 1 unit  for every 15 grams of carbohydrates.    5\. Continue libre sensors and can come in early to upload reader in between  visits.    ---      Procedure Codes    ---      63875 CONTINUOS GLUCOSE MON OFF SUP    ---    95251 GLUC MONITOR, CONT, PHYS I&R    ---  Follow Up    ---    already scheduled    Electronically signed by [name] MD on  06/07/2017 at 05:45 PM EDT    Sign off status: Completed        * * *        Endocrinology    [address]    [place], [place] [address]    Tel: [phone]    Fax: [phone]              * * *          Patient: [name], [name] DOB: 01/20/1976 Progress Note: [name], MD  06/07/2017    ---    Note generated by eClinicalWorks EMR/PM Software ([email or web address])

## 2017-06-19 ENCOUNTER — Ambulatory Visit

## 2017-06-19 ENCOUNTER — Ambulatory Visit: Admitting: Maternal & Fetal Medicine

## 2017-06-19 ENCOUNTER — Ambulatory Visit: Admit: 2017-06-19 | Payer: HMO

## 2017-06-19 LAB — HX HEM-ROUTINE
HX HCT: 31.7 % — ABNORMAL LOW (ref 32.0–45.0)
HX HGB: 10.8 g/dL — ABNORMAL LOW (ref 11.0–15.0)
HX MCH: 32 pg (ref 26.0–34.0)
HX MCHC: 34.1 g/dL (ref 32.0–36.0)
HX MCV: 93.8 fL (ref 80.0–98.0)
HX MPV: 9.7 fL (ref 9.1–11.7)
HX NRBC #: 0 10*3/uL
HX NUCLEATED RBC: 0 %
HX PLT: 391 10*3/uL (ref 150–400)
HX RBC BLOOD COUNT: 3.38 M/uL — ABNORMAL LOW (ref 3.70–5.00)
HX RDW: 14.6 % — ABNORMAL HIGH (ref 11.5–14.5)
HX WBC: 7.8 10*3/uL (ref 4.0–11.0)

## 2017-06-19 LAB — HX TRANSFUSION
HX ABO-RH INTERPRETATION (GEL): A NEG
HX ANTIBODY SCREEN (GEL): NEGATIVE

## 2017-06-19 NOTE — Progress Notes (Signed)
.    Progress Notes  .  Patient: [name], [name]  Provider: Marlowe Kays D   .  DOB: 21-Aug-1976 Age: 41 Y Sex: Female  .  PCP: [name]   Date: 06/19/2017  .  --------------------------------------------------------------------------------  .  CURRENT MEDICATIONS  .  Taking Accu-Chek Aviva-Blood Glucose Test Strips 1 as directed SQ  8 times per day, Taking Accu-Chek Softclix Lancets 1  Miscellaneous as directed SQ 6 times per day, Taking Aspirin Low  Dose 81 MG Tablet Chewable 1 tablet Orally Once a day, Taking BD  Pen Needle Short U/F 31G X 8 MM Miscellaneous as directed SQ 4  times a day, Taking Colace 100 MG Capsule 1 capsule as needed  Orally Once a day, Taking Humalog KwikPen 100 UNIT/ML Solution  Pen-injector 3 units as directed (max 15 units daily)  Subcutaneous with meals, Taking Lantus 100 UNIT/ML Solution 35  units (max of 45) Subcutaneous Once a day, Taking NIFEdipine ER  60 MG Tablet Extended Release 24 Hour 1 tablet on an empty  stomach Orally Once a day, Taking Prenatal , Medication List  reviewed and reconciled with the patient  .  PAST MEDICAL HISTORY  .  Diabetes , Chronic hypertension, Seizures as a child, Pregnancy:  yes.  .  ALLERGIES  .  Penicillin: rash.  .  SOCIAL HISTORY  .  .  Tobaccohistory:Never smoked.  .  GYN HISTORY  .  Hx Abnormal pap smear yes, with biopsy in past but no treatment.  Last pap 2015.  Menstrual History LMP 12/05/2016  STD HPV/condyloma, HSV/Herpes - outbreaks rare  Pregnancy #1 Date- /2006, Del- TAB, Comments- No complications  .  OB HISTORY  .  Previous Pregnancies Total Pregnancies: 2, Full Term: 0,  Premature: 0, AB. Induced: 1, AB. Spontaneous: 0, Ectopics: 0,  Multiple Births: 0, Living: 0  .  VITAL SIGNS  .  Pain scale 0/10, Ht-in 66, Wt-lbs 230, BMI 37.123, BP 130/74, BP  UE 130, BP LE 74.  .  ASSESSMENTS  .  Maternal chronic hypertension in second trimester - O10.912  (Primary)  .  Diabetes mellitus affecting pregnancy in second trimester  -  O24.912  .  Elderly multigravida in second trimester - O09.522  .  Obesity (BMI 30.0-34.9) - E66.9  .  LABS  .   LAB: Glucose 1 Hr  .  [name]   LAB: CBC/NO DIF (CBCND)  .  [name]  PROCEDURE CODES  .  [address] Rhogam, 60454 Rhogam, FULL-DOSE, IM, [id number] Routine  Venipuncture, [id number] PRENATAL VISIT  .  FOLLOW UP  .  2 Weeks  .  Electronically signed by Marlowe Kays , MD on  06/28/2017 at 05:11 PM EDT  .  Document electronically signed by Marlowe Kays D   .

## 2017-06-19 NOTE — Progress Notes (Signed)
**Progress Notes**    ---    **Patient:** [name], MULKERNAccount Number:** [id number] **External MRN:** [id number]   **Provider:** Elnora Morrison, MD     **DOB:** [birth year] **Age:** 17 Y **Sex:** Female   **Date:** 06/19/2017     **Phone:** [phone]   **CHN#:** [id number]     **Address:** [address], [place], [place]     **Pcp:** [name]        * * *         **Subjective:**        ---       **Chief Complaints:**    --- ---         --- ---      **Medical History:** Diabetes , Chronic hypertension, Seizures as a child,  Pregnancy: yes.        --- ---      **Gyn History:** Hx Abnormal pap smear yes, with biopsy in past but no  treatment. Last pap 2015.. Menstrual History LMP 12/05/2016. STD  HPV/condyloma, HSV/Herpes - outbreaks rare. Pregnancy #1 Date- /2006, Del-  TAB, Comments- No complications.    --- ---      **OB History:** Previous Pregnancies Total Pregnancies: 2, Full Term: 0,  Premature: 0, AB. Induced: 1, AB. Spontaneous: 0, Ectopics: 0, Multiple  Births: 0, Living: 0\.    --- ---      **Social History:** Tobacco history: Never smoked.    --- ---      **Medications:** Taking Accu-Chek Aviva-Blood Glucose Test Strips 1 as  directed SQ 8 times per day, Taking Accu-Chek Softclix Lancets 1 Miscellaneous  as directed SQ 6 times per day, Taking Aspirin Low Dose 81 MG Tablet Chewable  1 tablet Orally Once a day, Taking BD Pen Needle Short U/F 31G X 8 MM  Miscellaneous as directed SQ 4 times a day, Taking Colace 100 MG Capsule 1  capsule as needed Orally Once a day, Taking Humalog KwikPen 100 UNIT/ML  Solution Pen-injector 3 units as directed (max 15 units daily) Subcutaneous  with meals, Taking Lantus 100 UNIT/ML Solution 35 units (max of 45)  Subcutaneous Once a day, Taking NIFEdipine ER 60 MG Tablet Extended Release 24  Hour 1 tablet on an empty stomach Orally Once a day, Taking Prenatal ,  Medication List reviewed and reconciled with the patient    --- ---       **Allergies:**  Penicillin: rash.    --- ---        **Objective:**        ---       **Vitals:** Pain scale 0/10, Ht-in 66, Wt-lbs 230, BMI 37.123, BP 130/74,  BP UE 130, BP LE 74.    --- ---         **Assessment:**        ---       **Assessment:**    1\. Maternal chronic hypertension in second trimester - O10.912 (Primary)    2\. Diabetes mellitus affecting pregnancy in second trimester - O24.912    3\. Elderly multigravida in second trimester - O09.522    23\. Obesity (BMI 30.0-34.9) - E66.9    --- ---        **Plan:**        ---         --- ---       **Therapeutic Injections:**    Rhogam : 300 given by Reece Packer , RN on right gluteus    --- ---       **  Labs:**    --- ---    Gerald Stabs: Glucose 1 Hr_    ---    _Lab: CBC/NO DIF (CBCND)_       **Procedure Codes:** [id number] Rhogam, 65784 Rhogam, FULL-DOSE, IM, 69629  Routine Venipuncture, 52841 PRENATAL VISIT    --- ---       **Follow Up:** 2 Weeks    --- ---    ---    ---          **Provider:** Elnora Morrison, MD    ---     **Patient:** [name], [name] **DOB:** 03-28-1976 **Date:** 06/19/2017    ---    Electronically signed by Marlowe Kays , MD on 06/28/2017 at 05:11 PM EDT    Sign off status: Completed

## 2017-06-21 ENCOUNTER — Ambulatory Visit

## 2017-06-21 MED ORDER — INSULIN GLARGINE: 1 | 4 | 4 refills | 0 days

## 2017-06-25 ENCOUNTER — Ambulatory Visit

## 2017-06-25 MED ORDER — INSULIN GLARGINE: 1 | 4 | 4 refills | 0 days

## 2017-07-03 ENCOUNTER — Ambulatory Visit: Admitting: Maternal & Fetal Medicine

## 2017-07-03 ENCOUNTER — Ambulatory Visit

## 2017-07-03 NOTE — Progress Notes (Signed)
* * *        **  [name]**    --- ---    67 Y old Female, DOB: [birth year]    [address], [place], [place] [address]    Home: [phone]    Provider: Elnora Morrison        * * *    Telephone Encounter    ---    Answered by   Debbora Dus  Date: 07/03/2017         Time: 01:05 PM    Caller   Heidi Dakins    --- ---            Reason   dental work            Message                      [facility] called c/o mouth senstitivites and possible filling replacement. Pt requested a letter for treatment be sent to her dentist.  This rn spoke with Dr Murrell Redden, letter written and faxed to Dr Tenny Craw at [facility]. Called patient and lvm that letter has been successfully faxed.                Action Taken   Lane,Beth 07/03/2017 1:07:15 PM >                * * *                ---          * * *          Patient: [name], Heidi Dakins DOB: [birth year] Provider: Marlowe Kays D  07/03/2017    ---    Note generated by eClinicalWorks EMR/PM Software ([email or web address])

## 2017-07-17 ENCOUNTER — Ambulatory Visit

## 2017-07-17 ENCOUNTER — Ambulatory Visit: Admitting: Maternal & Fetal Medicine

## 2017-07-17 ENCOUNTER — Ambulatory Visit: Admit: 2017-07-17 | Payer: HMO

## 2017-07-31 ENCOUNTER — Ambulatory Visit: Admitting: Maternal & Fetal Medicine

## 2017-07-31 ENCOUNTER — Ambulatory Visit

## 2017-07-31 ENCOUNTER — Ambulatory Visit: Admit: 2017-07-31 | Payer: HMO

## 2017-07-31 NOTE — Progress Notes (Signed)
**Progress Notes**    ---    **Patient:** [name], DIGNANAccount Number:** [id number] **External MRN:** [id number]   **Provider:** Elnora Morrison, MD     **DOB:** [birth year] **Age:** 38 Y **Sex:** Female   **Date:** 07/31/2017     **Phone:** [phone]   **CHN#:** [id number]     **Address:** [address], [place], [place]     **Pcp:** [name]        * * *         **Subjective:**        ---       **Chief Complaints:**    --- ---         --- ---      **Medical History:** Diabetes , Chronic hypertension, Seizures as a child.        --- ---      **Gyn History:** Hx Abnormal pap smear yes, with biopsy in past but no  treatment. Last pap 2015.. Menstrual History LMP 12/05/2016. STD  HPV/condyloma, HSV/Herpes - outbreaks rare. Pregnancy #1 Date- /2006, Del-  TAB, Comments- No complications.    --- ---      **OB History:** Previous Pregnancies Total Pregnancies: 2, Full Term: 0,  Premature: 0, AB. Induced: 1, AB. Spontaneous: 0, Ectopics: 0, Multiple  Births: 0, Living: 0\.    --- ---      **Social History:** Tobacco history: Never smoked.    --- ---      **Medications:** Taking Accu-Chek Aviva-Blood Glucose Test Strips 1 as  directed SQ 8 times per day, Taking Accu-Chek Softclix Lancets 1 Miscellaneous  as directed SQ 6 times per day, Taking Aspirin Low Dose 81 MG Tablet Chewable  1 tablet Orally Once a day, Taking BD Pen Needle Short U/F 31G X 8 MM  Miscellaneous as directed SQ 4 times a day, Taking Colace 100 MG Capsule 1  capsule as needed Orally Once a day, Taking Humalog KwikPen 100 UNIT/ML  Solution Pen-injector 3 units as directed (max 15 units daily) Subcutaneous  with meals, Taking Lantus 100 UNIT/ML Solution 35 units (max of 45)  Subcutaneous Once a day, Taking NIFEdipine ER 60 MG Tablet Extended Release 24  Hour 1 tablet on an empty stomach Orally Once a day, Taking Prenatal    --- ---      **Allergies:** Penicillin: rash.    --- ---        **Objective:**        ---       **Vitals:** Pain  scale 7/10 hands numbness, Ht-in 66, Wt-lbs 238, BMI  38.414, BP 131/73, BP UE 131, BP LE 73.    --- ---         **Assessment:**        ---       **Assessment:**    1\. Pre-existing type 2 diabetes mellitus during pregnancy in third trimester  - O24.113 (Primary)    2\. Chronic hypertension during pregnancy - O10.919    3\. Elderly multigravida in third trimester - O09.523    6\. Other viral diseases complicating pregnancy, third trimester - O98.513    --- ---        **Plan:**        ---         **1\. Others**    Start Tetanus-Diphth-Acell Pertussis Suspension, 5-2.5-18.5 LF-MCG/0.5, as  directed, Intramuscular ; Start Iron (Ferrous Gluconate) Tablet, 256 (28 Fe)  MG, 1 tablet, Orally, Once a day, 30 day(s), 30 .    ---      **  Immunizations:**    Tdap : 0.5 mL (Route: Intramuscular) given by Debbora Dus on Left Deltoid    --- ---       **Procedure Codes:** [phone] Tdap, [id number] PRENATAL VISIT    --- ---       **Follow Up:** 2 Weeks    --- ---    ---    ---          **Provider:** Elnora Morrison, MD    ---     **Patient:** [name], [name] **DOB:** 09/02/1976 **Date:** 07/31/2017    ---    Electronically signed by Marlowe Kays , MD on 07/31/2017 at 04:34 PM EDT    Sign off status: Completed

## 2017-07-31 NOTE — Progress Notes (Signed)
.    Progress Notes  .  Patient: [name], [name]  Provider: Marlowe Kays D   .  DOB: 11/08/1975 Age: 41 Y Sex: Female  .  PCP: [name]   Date: 07/31/2017  .  --------------------------------------------------------------------------------  .  CURRENT MEDICATIONS  .  Taking Accu-Chek Aviva-Blood Glucose Test Strips 1 as directed SQ  8 times per day, Taking Accu-Chek Softclix Lancets 1  Miscellaneous as directed SQ 6 times per day, Taking Aspirin Low  Dose 81 MG Tablet Chewable 1 tablet Orally Once a day, Taking BD  Pen Needle Short U/F 31G X 8 MM Miscellaneous as directed SQ 4  times a day, Taking Colace 100 MG Capsule 1 capsule as needed  Orally Once a day, Taking Humalog KwikPen 100 UNIT/ML Solution  Pen-injector 3 units as directed (max 15 units daily)  Subcutaneous with meals, Taking Lantus 100 UNIT/ML Solution 35  units (max of 45) Subcutaneous Once a day, Taking NIFEdipine ER  60 MG Tablet Extended Release 24 Hour 1 tablet on an empty  stomach Orally Once a day, Taking Prenatal  .  PAST MEDICAL HISTORY  .  Diabetes , Chronic hypertension, Seizures as a child.  .  ALLERGIES  .  Penicillin: rash.  .  SOCIAL HISTORY  .  .  Tobaccohistory:Never smoked.  .  GYN HISTORY  .  Hx Abnormal pap smear yes, with biopsy in past but no treatment.  Last pap 2015.  Menstrual History LMP 12/05/2016  STD HPV/condyloma, HSV/Herpes - outbreaks rare  Pregnancy #1 Date- /2006, Del- TAB, Comments- No complications  .  OB HISTORY  .  Previous Pregnancies Total Pregnancies: 2, Full Term: 0,  Premature: 0, AB. Induced: 1, AB. Spontaneous: 0, Ectopics: 0,  Multiple Births: 0, Living: 0  .  VITAL SIGNS  .  Pain scale 7/10 hands numbness, Ht-in 66, Wt-lbs 238, BMI 38.414,  BP 131/73, BP UE 131, BP LE 73.  .  ASSESSMENTS  .  Pre-existing type 2 diabetes mellitus during pregnancy in third  trimester - O24.113 (Primary)  .  Chronic hypertension during pregnancy - O10.919  .  Elderly multigravida in third trimester - O09.523  .  Other  viral diseases complicating pregnancy, third trimester -  O98.513  .  TREATMENT  .  Others  Start Tetanus-Diphth-Acell Pertussis Suspension, 5-2.5-18.5  LF-MCG/0.5, as directed, Intramuscular  Start Iron (Ferrous Gluconate) Tablet, 256 (28 Fe) MG, 1 tablet,  Orally, Once a day, 30 day(s), 30  .  PROCEDURE CODES  .  40102 Tdap, [phone] PRENATAL VISIT  .  FOLLOW UP  .  2 Weeks  .  Electronically signed by Marlowe Kays , MD on  07/31/2017 at 04:34 PM EDT  .  Document electronically signed by Marlowe Kays D   .

## 2017-08-01 ENCOUNTER — Ambulatory Visit

## 2017-08-02 ENCOUNTER — Ambulatory Visit

## 2017-08-06 ENCOUNTER — Ambulatory Visit

## 2017-08-06 ENCOUNTER — Ambulatory Visit: Admitting: Internal Medicine

## 2017-08-06 ENCOUNTER — Ambulatory Visit: Admit: 2017-08-06 | Payer: HMO

## 2017-08-06 LAB — HX CHEM-PANELS
HX ANION GAP: 5 (ref 3–14)
HX BLOOD UREA NITROGEN: 13 mg/dL (ref 6–24)
HX CHLORIDE (CL): 110 meq/L (ref 98–110)
HX CO2: 22 meq/L (ref 20–30)
HX CREATININE (CR): 0.77 mg/dL (ref 0.57–1.30)
HX GFR, AFRICAN AMERICAN: 110 mL/min/{1.73_m2}
HX GFR, NON-AFRICAN AMERICAN: 95 mL/min/{1.73_m2}
HX GLUCOSE: 86 mg/dL (ref 70–139)
HX POTASSIUM (K): 4.3 meq/L (ref 3.6–5.1)
HX SODIUM (NA): 137 meq/L (ref 135–145)

## 2017-08-06 LAB — HX CHEM-LFT
HX ALANINE AMINOTRANSFERASE (ALT/SGPT): 45 IU/L (ref 0–54)
HX ALKALINE PHOSPHATASE (ALK): 202 IU/L — ABNORMAL HIGH (ref 40–130)
HX ASPARTATE AMINOTRANFERASE (AST/SGOT): 32 IU/L (ref 10–42)
HX BILIRUBIN, DIRECT: 0.1 mg/dL (ref 0.0–0.3)
HX BILIRUBIN, TOTAL: 0.2 mg/dL (ref 0.2–1.1)

## 2017-08-06 LAB — HX BF-CHEM/URINE
HX ALBUMIN RANDOM URINE: <0.5 mg/dL
HX CREATININE, RANDOM URINE: 30.61 mg/dL

## 2017-08-06 LAB — HX CHOLESTEROL
HX CHOLESTEROL: 194 mg/dL (ref 110–199)
HX HIGH DENSITY LIPOPROTEIN CHOL (HDL): 69 mg/dL (ref 35–75)
HX LDL: 88 mg/dL (ref 0–129)
HX TRIGLYCERIDES: 183 mg/dL (ref 40–250)

## 2017-08-06 LAB — HX DIABETES: HX ALBUMIN RANDOM URINE: <0.5 mg/dL

## 2017-08-06 LAB — HX CHEM-LIPIDS
HX CHOL-HDL RATIO: 2.8
HX CHOLESTEROL: 194 mg/dL (ref 110–199)
HX HIGH DENSITY LIPOPROTEIN CHOL (HDL): 69 mg/dL (ref 35–75)
HX HOURS FAST: 2 h
HX LDL: 88 mg/dL (ref 0–129)
HX TRIGLYCERIDES: 183 mg/dL (ref 40–250)

## 2017-08-06 LAB — HX CHEM-OTHER: HX CALCIUM (CA): 10 mg/dL (ref 8.5–10.5)

## 2017-08-06 NOTE — Progress Notes (Signed)
* * *        [name]**    --- ---    41 Y old Female, DOB: November 20, 1975, External MRN: [id number]    Account Number: [id number]    [address], [place], [place]    Home: [phone]    Insurance: HMO [employer]    PCP: [name] Referring: [name]    Appointment Facility: Endocrinology        * * *    08/06/2017  Progress Notes: Heidi Payor, MD **CHN#:** [id number]    --- ---    ---        Reason for Appointment    ---      1\. NP/DIABETIC RETINOPATHYPER DR.TISHLER    ---    2\. Diabetes Type 2    ---      History of Present Illness    ---     _GENERAL_ :    41 yo F with history of DM II, HTN, HLD, and currently [redacted] wks pregnant, who  presents for diabetes management during pregnancy. Patient was diagnosed in  her early 20's with type II Diabetes and was on metformin at some point, but  has been on insulin for the past few years (<1 decade per patient). She has  only been on lantus only. For the most part, patient says that the lantus has  kept her diabetes under good control.    Patient says that she has severe dry eye with blurry vision that's worse after  staring at the computer for too long. Dr. [name] tried to get her to see an  opthalmologist, and she saw one in April, but her eyes were not dilated for  the exam as they found out that she was pregnant.    Patient denies any symptoms of hyperglycemia or hypoglycemia at home.    ---    Interval Hx    - Labs: not done for today, but recent HbA1c is 6.3% 05/03/17.    - BG numbers: (Accuchek aviva meter) and libre sensor approved and using  since 7/24. Average for last 9 days was 105 mg/dL (~5.7%)    - Meds: lantus 35 units Qam, humalog 3 units Qpc (after meals)    - Diet: eating more often and smaller meals more often.    - Activity: walks a couple of times a week for 15-41min each time and will  lift weights at home occasionally.    - She is [redacted]w[redacted]d today (EDD 09/11/17).    - Today the patient denies excessive thirst,  polyuria, HA, light headedness,  SOB, CP, abdominal pain, dysphagia, nausea, vomiting, diarrhea, fever or  chills.    ---    HbA1c    6.3% 05/03/17    8.1% 02/13/2017    ---    Diet    B - mixed graincheerios with almond milk, boiled eggs    L - [place] sandwich/healthy choice meals    D - salad/fish/steak    S - mixed nuts, cucumbers, carrots.      Current Medications    ---    Taking     * Accu-Chek Aviva-Blood Glucose Test Strips 1 as directed SQ 8 times per day    ---    * Accu-Chek Softclix Lancets 1 Miscellaneous as directed SQ 6 times per day    ---    * Aspirin Low Dose 81 MG Tablet Chewable 1 tablet Orally Once a day    ---    *  BD Pen Needle Short U/F 31G X 8 MM Miscellaneous as directed SQ 4 times a day    ---    * Colace 100 MG Capsule 1 capsule as needed Orally Once a day    ---    * Humalog KwikPen 100 UNIT/ML Solution Pen-injector 3 units as directed (max 15 units daily) Subcutaneous with meals    ---    * Iron (Ferrous Gluconate) 256 (28 Fe) MG Tablet 1 tablet Orally Once a day    ---    * Lantus 100 UNIT/ML Solution 35 units (max of 45) Subcutaneous Once a day    ---    * NIFEdipine ER 60 MG Tablet Extended Release 24 Hour 1 tablet on an empty stomach Orally Once a day    ---    * Prenatal     ---    * Tetanus-Diphth-Acell Pertussis 5-2.5-18.5 LF-MCG/0.5 Suspension as directed Intramuscular     ---    * Medication List reviewed and reconciled with the patient    ---      Past Medical History    ---       Diabetes .        ---    Chronic hypertension.        ---    Seizures as a child.        ---      Surgical History    ---      lap cholecystectomy 2010    ---      Family History    ---      Mother: alive 42 yrs, diagnosed with Diabetes, Hypertension    ---    Father: deceased, diagnosed with Heart Disease in 69    ---    Maternal Grand Mother: alive, diagnosed with Diabetes, Hypertension    ---    Parnter is 31 and is [place]. No fam hx of congen anomalies, MR, stillbirth,  sickle cell disease.     Father- H/o CHF    Mother - h/o diabetes and HTN.    ---      Social History    ---    Tobacco    history: _Never smoked_       Allergies    ---      Penicillin: rash    ---      Review of Systems    ---    A complete review of systems was negative except for those mentioned in the  HPI.      Vital Signs    ---    Pain scale 7 hands, Ht-in 66, Wt-lbs 240, BMI 38.73, BP 135/77, HR 93, RR 16,  Temp 98.2, Oxygen sat % 100.      Physical Examination    ---     _Endo: Thyroid Clinic_ :    General: appears well, no distress.    HEENT: NC/AT, EOMI, no proptosis, no lid lag.    Neck: no erythema, no tracheal deviation, negative Pemberton's sign,  acanthosis nigricans noted.    Lungs: good air entry, clear bilaterally.    Heart: S1 & S2 normal, regular rate & rhythm, no murmurs, rubs, or gallops.    Abdomen: soft, non-tender, non-distended, obese.    Extremities: no tremor in hands bilaterally, no clubbing, cyanosis, or edema,  good muscle mass.    Neurology: normal gait, normal 5/5 strength, no proximal myopathy.    Dermatology: no skin dryness, anicteric.    Psych: no anxiety, no  depression.          Assessments    ---    1\. Pre-existing type 2 diabetes mellitus during pregnancy in second trimester  - O24.112 (Primary)    ---    2\. Obesity (BMI 30.0-34.9) - E66.9    ---    3\. Essential hypertension - I10    ---      \---    1\. Diabetes type 2 during pregnancy    She has been doing much better with her diabetic control as well as eating.  She is using Lantus 35 and 3 units Humalog before meals (uses after  occasionally). She needs to check more often more than 3 times a day to get a  full CGM analysis. Additionally has spikes after large meals will go up to 5  units before these bigger meals. After birth will probably need to come down  on the Lantus to 20- 25 units and hold on preprandial meals bringing sensor  data and so we can adjust insulin in between her visits. For today:    - Continue lantus at 35 units QAM  (will most likely need to reduce after  giving birth to 20-25 units daily).    - Continue humalog at 3 units TID before meals and 5 units for the bigger  meals like [employer] (did ask to curb eating these larger fattier meals).    - Went over insulin injections and to move the shots around (can also inject  underneath arms or in thighs)    - Continue on the [place] sensors and bring in reader each visit.    - labs today.    - libre sensor CGM downloaded and interpreted today to help with insulin  titration and blood sugar control.    ---    2\. Diabetes associated problems    - HTN: continue home nifedipine    - HLD: currently holding statin given pregnancy    - Eyes: Vision has improved although I still encouraged patient to go back to  ophtho for proper eye exam    - Kidneys: normal Cr 0.8 in April/2018    - Feet: normal foot exam with no loss of sensation/numbness tingling    - CAD: asymptomatic currently, denies chest pain. Stress ECHO on 02/13/17  negative for signs of ischemia    ---    3\. obesity/nutrition    She continues to have a good diet although does cheat with McDonald[employer]  occasionally which shows on her BG analysis and CGM data. Again, encouraged to  walk for 3-4 times a week instead of high intensity exercises. Advised  patient to maintain current weight during pregnancy, but can try to lose  weight again after delivery.    ---    40-minute appointment of which greater than 50% was spent counseling patient  on diabetic treatment as well as dietary modifications and insulin use during  pregnancy.    ---      Treatment    ---       **1\. Pre-existing type 2 diabetes mellitus during pregnancy in second  trimester**    _LAB: Hemoglobin A1c (A1C; glycohemoglobin)_    _LAB: Basic Metabolic Panel (BMP)_    _LAB: Lipid Profile (LIPID)_    _LAB: Albumin, random urine w/ creat (UALB)_    _LAB: Fructosamine (FRUCT; glycated proteins)_    _LAB: Liver Function Tests (LFT)_    Notes: 1. Get lab work today     2\. Continue Lantus  to 35 units (may need to reduce it after birth to 20-25  units daily)    3\. Continue Humalog 3 units before small regular meals 5 units for the larger  meals. May not need this after giving birth.    4\. Continue libre sensors and can come in early to upload reader in between  visits so that we can see if changes are needed to insulin dosing.    ---      Follow Up    ---    6-8 weeks, labs today    Electronically signed by Heidi Payor MD on 08/06/2017 at 05:19 PM EDT    Sign off status: Completed        * * *        Endocrinology    [address]    [place], [place] [address]    Tel: [phone]    Fax: [phone]              * * *          Patient: [name], [name] DOB: [birth year] Progress Note: Heidi Payor, MD  08/06/2017    ---    Note generated by eClinicalWorks EMR/PM Software ([email or web address])

## 2017-08-06 NOTE — Progress Notes (Signed)
.  Progress Notes  .  Patient: [name]  Provider: [name]  MD  .  DOB: Mar 30, 1976 Age: 41 Y Sex: Female  .  PCP: [name]   Date: 08/06/2017  .  --------------------------------------------------------------------------------  .  REASON FOR APPOINTMENT  .  1. NP/DIABETIC RETINOPATHYPER DR.TISHLER  .  2. Diabetes Type 2  .  HISTORY OF PRESENT ILLNESS  .  GENERAL:   41 yo F with history of DM II, HTN, HLD, and currently [redacted] wks  pregnant, who presents for diabetes management during pregnancy.  Patient was diagnosed in her early 20's with type II Diabetes and  was on metformin at some point, but has been on insulin for the  past few years (<1 decade per patient). She has only been on  lantus only. For the most part, patient says that the lantus has  kept her diabetes under good control. Patient says that she has  severe dry eye with blurry vision that's worse after staring at  the computer for too long. Dr. [name] tried to get her to see an  opthalmologist, and she saw one in April, but her eyes were not  dilated for the exam as they found out that she was pregnant.  Patient denies any symptoms of hyperglycemia or hypoglycemia at  home. ---Interval Hx- Labs: not done for today, but recent HbA1c  is 6.3% 05/03/17.- BG numbers: (Accuchek aviva meter) and libre  sensor approved and using since 7/24. Average for last 9 days was  105 mg/dL (  4.6%)- Meds: lantus 35 units Qam, humalog 3 units Qpc  (after meals)- Diet: eating more often and smaller meals more  often. - Activity: walks a couple of times a week for 15-62min  each time and will lift weights at home occasionally.- She is  [redacted]w[redacted]d today (EDD 09/11/17). - Today the patient denies excessive  thirst, polyuria, HA, light headedness, SOB, CP, abdominal pain,  dysphagia, nausea, vomiting, diarrhea, fever or  chills.---HbA1c6.3% 6/28/188.1% 02/13/2017---DietB - mixed  graincheerios with almond milk, boiled eggsL - [place]  sandwich/healthy choice  mealsD - salad/fish/steakS - mixed nuts,  cucumbers, carrots.  .  CURRENT MEDICATIONS  .  Taking Accu-Chek Aviva-Blood Glucose Test Strips 1 as directed SQ  8 times per day  Taking Accu-Chek Softclix Lancets 1 Miscellaneous as directed SQ  6 times per day  Taking Aspirin Low Dose 81 MG Tablet Chewable 1 tablet Orally  Once a day  Taking BD Pen Needle Short U/F 31G X 8 MM Miscellaneous as  directed SQ 4 times a day  Taking Colace 100 MG Capsule 1 capsule as needed Orally Once a  day  Taking Humalog KwikPen 100 UNIT/ML Solution Pen-injector 3 units  as directed (max 15 units daily) Subcutaneous with meals  Taking Iron (Ferrous Gluconate) 256 (28 Fe) MG Tablet 1 tablet  Orally Once a day  Taking Lantus 100 UNIT/ML Solution 35 units (max of 45)  Subcutaneous Once a day  Taking NIFEdipine ER 60 MG Tablet Extended Release 24 Hour 1  tablet on an empty stomach Orally Once a day  Taking Prenatal  Taking Tetanus-Diphth-Acell Pertussis 5-2.5-18.5 LF-MCG/0.5  Suspension as directed Intramuscular  Medication List reviewed and reconciled with the patient  .  PAST MEDICAL HISTORY  .  Diabetes  Chronic hypertension  Seizures as a child  .  ALLERGIES  .  Penicillin: rash  .  SURGICAL HISTORY  .  lap cholecystectomy 2010  .  FAMILY HISTORY  .  Mother: alive 20 yrs, diagnosed with Diabetes, Hypertension  Father: deceased, diagnosed with Heart Disease in 85  Maternal Grand Mother: alive, diagnosed with Diabetes,  Hypertension  Parnter is 75 and is [place]. No fam hx of congen anomalies, MR,  stillbirth, sickle cell disease. Father- H/o CHFMother - h/o  diabetes and HTN.  .  SOCIAL HISTORY  .  .  Tobacco  history:Never smoked  .  REVIEW OF SYSTEMS  .  A complete review of systems was negative except for those  mentioned in the HPI.  [name]  VITAL SIGNS  .  Pain scale 7 hands, Ht-in 66, Wt-lbs 240, BMI 38.73, BP 135/77,  HR 93, RR 16, Temp 98.2, Oxygen sat % 100.  [name]  PHYSICAL EXAMINATION  .  Endo: Thyroid Clinic:  General:  appears well, no  distress.  HEENT:  NC/AT, EOMI, no proptosis, no lid lag.  Neck:  no erythema, no tracheal deviation, negative Pemberton's  sign, acanthosis nigricans noted.  Lungs:  good air entry, clear bilaterally.  Heart:  S1 & S2 normal, regular rate & rhythm, no murmurs, rubs,  or gallops.  Abdomen:  soft, non-tender, non-distended, obese.  Extremities:  no tremor in hands bilaterally, no clubbing,  cyanosis, or edema, good muscle mass.  Neurology:  normal gait, normal 5/5 strength, no proximal  myopathy.  Dermatology:  no skin dryness, anicteric.  Psych:  no anxiety, no depression.  .  ASSESSMENTS  .  Pre-existing type 2 diabetes mellitus during pregnancy in second  trimester - O24.112 (Primary)  .  Obesity (BMI 30.0-34.9) - E66.9  .  Essential hypertension - I10  .  ---1. Diabetes type 2 during pregnancyShe has been doing much  better with her diabetic control as well as eating. She is using  Lantus 35 and 3 units Humalog before meals (uses after  occasionally). She needs to check more often more than 3 times a  day to get a full CGM analysis. Additionally has spikes after  large meals will go up to 5 units before these bigger meals.  After birth will probably need to come down on the Lantus to 20-  25 units and hold on preprandial meals bringing sensor data and  so we can adjust insulin in between her visits. For today:-  Continue lantus at 35 units QAM (will most likely need to reduce  after giving birth to 20-25 units daily).- Continue humalog at 3  units TID before meals and 5 units for the bigger meals like  [employer] (did ask to curb eating these larger fattier meals).-  Went over insulin injections and to move the shots around (can  also inject underneath arms or in thighs)- Continue on the [place]  sensors and bring in reader each visit. - labs today. - libre  sensor CGM downloaded and interpreted today to help with insulin  titration and blood sugar control. ---2. Diabetes associated  problems- HTN: continue home  nifedipine- HLD: currently holding  statin given pregnancy- Eyes: Vision has improved although I  still encouraged patient to go back to ophtho for proper eye exam  - Kidneys: normal Cr 0.8 in April/2018- Feet: normal foot exam  with no loss of sensation/numbness tingling- CAD: asymptomatic  currently, denies chest pain. Stress ECHO on 02/13/17 negative for  signs of ischemia---3. obesity/nutritionShe continues to have a  good diet although does cheat with McDonald's occasionally which  shows on her BG analysis and CGM data. Again, encouraged to walk  for 3-4 times  a week instead of high intensity exercises.  Advised patient to maintain current weight during pregnancy, but  can try to lose weight again after delivery. ---40-minute  appointment of which greater than 50% was spent counseling  patient on diabetic treatment as well as dietary modifications  and insulin use during pregnancy.  .  TREATMENT  .  Pre-existing type 2 diabetes mellitus during  pregnancy in second trimester  LAB: Hemoglobin A1c (A1C; glycohemoglobin)  .  LAB: Basic Metabolic Panel (BMP)  .  LAB: Lipid Profile (LIPID)  .  LAB: Albumin, random urine w/ creat (UALB)  .  LAB: Fructosamine (FRUCT; glycated proteins)  .  LAB: Liver Function Tests (LFT)  .  Notes: 1. Get lab work today2. Continue Lantus to 35 units (may  need to reduce it after birth to 20-25 units daily)3. Continue  Humalog 3 units before small regular meals 5 units for the larger  meals. May not need this after giving birth.4. Continue libre  sensors and can come in early to upload reader in between visits  so that we can see if changes are needed to insulin dosing.  .  FOLLOW UP  .  6-8 weeks, labs today  .  Electronically signed by [name] MD on  08/06/2017 at 05:19 PM EDT  .  Document electronically signed by [name]  MD  .

## 2017-08-07 ENCOUNTER — Ambulatory Visit: Admitting: Maternal & Fetal Medicine

## 2017-08-07 ENCOUNTER — Ambulatory Visit: Admit: 2017-08-07 | Payer: HMO

## 2017-08-07 LAB — HX CHEM-METABOLIC: HX HEMOGLOBIN A1C: 6.7 % — ABNORMAL HIGH

## 2017-08-07 LAB — HX DIABETES
HX GLUCOSE: 86 mg/dL (ref 70–139)
HX HEMOGLOBIN A1C: 6.7 % — ABNORMAL HIGH

## 2017-08-13 LAB — HX CHEM-METABOLIC: HX FRUCTOSAMINE (GLYCOSYLATED ALBUMIN): 216 umol/L

## 2017-08-14 ENCOUNTER — Ambulatory Visit

## 2017-08-14 ENCOUNTER — Ambulatory Visit: Admitting: Obstetrics & Gynecology

## 2017-08-14 ENCOUNTER — Ambulatory Visit: Admitting: Maternal & Fetal Medicine

## 2017-08-14 ENCOUNTER — Ambulatory Visit: Admit: 2017-08-14 | Payer: HMO

## 2017-08-14 MED ORDER — Valtrex: 500 | 60 | Freq: Two times a day (BID) | 0 refills | 0 days | Status: AC

## 2017-08-14 NOTE — Progress Notes (Signed)
**Progress Notes**    ---    **Patient:** [name], AVELLINOAccount Number:** [id number] **External MRN:** [id number]   **Provider:** Elnora Morrison, MD     **DOB:** 03-25-1976 **Age:** 69 Y **Sex:** Female   **Date:** 08/14/2017     **Phone:** [phone]   **CHN#:** [id number]     **Address:** [address], [place], [place]     **Pcp:** [name]        * * *         **Subjective:**        ---       **Chief Complaints:**    --- ---         --- ---      **Medical History:** Diabetes , Chronic hypertension, Seizures as a child,  Pregnancy: yes.        --- ---      **Gyn History:** Hx Abnormal pap smear yes, with biopsy in past but no  treatment. Last pap 2015.. Menstrual History LMP 12/05/2016. STD  HPV/condyloma, HSV/Herpes - outbreaks rare. Pregnancy #1 Date- /2006, Del-  TAB, Comments- No complications.    --- ---      **OB History:** Previous Pregnancies Total Pregnancies: 2, Full Term: 0,  Premature: 0, AB. Induced: 1, AB. Spontaneous: 0, Ectopics: 0, Multiple  Births: 0, Living: 0\.    --- ---      **Surgical History:** lap cholecystectomy 2010.    --- ---      **Social History:** Tobacco history: Never smoked.    --- ---      **Medications:** Taking Accu-Chek Aviva-Blood Glucose Test Strips 1 as  directed SQ 8 times per day, Taking Accu-Chek Softclix Lancets 1 Miscellaneous  as directed SQ 6 times per day, Taking Aspirin Low Dose 81 MG Tablet Chewable  1 tablet Orally Once a day, Taking BD Pen Needle Short U/F 31G X 8 MM  Miscellaneous as directed SQ 4 times a day, Taking Colace 100 MG Capsule 1  capsule as needed Orally Once a day, Taking Humalog KwikPen 100 UNIT/ML  Solution Pen-injector 3 units as directed (max 15 units daily) Subcutaneous  with meals, Taking Iron (Ferrous Gluconate) 256 (28 Fe) MG Tablet 1 tablet  Orally Once a day, Taking Lantus 100 UNIT/ML Solution 35 units (max of 45)  Subcutaneous Once a day, Taking NIFEdipine ER 60 MG Tablet Extended Release 24  Hour 1 tablet on an  empty stomach Orally Once a day, Taking Prenatal ,  Discontinued Tetanus-Diphth-Acell Pertussis 5-2.5-18.5 LF-MCG/0.5 Suspension  as directed Intramuscular , Medication List reviewed and reconciled with the  patient    --- ---       **Allergies:** Penicillin: rash.    --- ---        **Objective:**        ---       **Vitals:** Pain scale 8/10 hands, Ht-in 66, Wt-lbs 241, BMI 38.898, BP  130/77, BP UE 130, BP LE 77.    --- ---         **Assessment:**        ---       **Assessment:**    1\. Pre-existing type 2 diabetes mellitus during pregnancy in third trimester  - O24.113 (Primary)    2\. Herpesviral infection, unspecified - B00.9    3\. Other viral diseases complicating pregnancy, unspecified trimester -  O98.519    4\. Obesity (BMI 30.0-34.9) - E66.9    5\. Chronic hypertension during pregnancy - O10.919  6\. Elderly multigravida in third trimester - O09.523    7\. Pre-existing type 2 diabetes mellitus during pregnancy in second trimester  - O24.112    8\. Maternal chronic hypertension in second trimester - O10.912    --- ---        **Plan:**        ---         **1\. Other viral diseases complicating pregnancy, unspecified  trimester**    Start Valtrex Tablet, 500 mg, 1 tablet, Orally, twice a day, 30, 60, Refills 0  .    ---    **2\. Pre-existing type 2 diabetes mellitus during pregnancy in second  trimester**    Continue Accu-Chek Aviva-Blood Glucose Test Strips, 1, as directed, SQ, 8  times per day ; Continue Accu-Chek Softclix Lancets Miscellaneous, 1, as  directed, SQ, 6 times per day ; Continue BD Pen Needle Short U/F  Miscellaneous, 31G X 8 MM, as directed, SQ, 4 times a day ; Continue Humalog  KwikPen Solution Pen-injector, 100 UNIT/ML, 3 units as directed (max 15 units  daily), Subcutaneous, with meals ; Continue Lantus Solution, 100 UNIT/ML, 35  units (max of 45), Subcutaneous, Once a day .    **3\. Maternal chronic hypertension in second trimester**    Continue Aspirin Low Dose Tablet Chewable, 81 MG,  1 tablet, Orally, Once a day  .    **4\. Others**    Continue Colace Capsule, 100 MG, 1 capsule as needed, Orally, Once a day ;  Continue Iron (Ferrous Gluconate) Tablet, 256 (28 Fe) MG, 1 tablet, Orally,  Once a day ; Continue NIFEdipine ER Tablet Extended Release 24 Hour, 60 MG, 1  tablet on an empty stomach, Orally, Once a day ; Continue Prenatal .      **Immunizations:**    Flu Vaccine : 0.5 mL (Route: Intramuscular) given by Debbora Dus on Left Deltoid    --- ---       **Labs:**    --- ---    Gerald Stabs: Strep Group B Culture_    ---       **Procedure Codes:** 10272 FLU VAC NO PRSV 4 VAL 3 YRS+, 53664 PRENATAL  VISIT    --- ---       **Follow Up:** 1 Week, 2-3 Days, 1 Week    --- ---    ---    ---          **Provider:** Elnora Morrison, MD    ---     **Patient:** [name], [name] **DOB:** 07-28-1976 **Date:** 08/14/2017    ---    Electronically signed by Marlowe Kays , MD on 08/14/2017 at 01:37 PM EDT    Sign off status: Completed

## 2017-08-14 NOTE — Progress Notes (Signed)
.  Progress Notes  .  Patient: [name], [name]  Provider: Marlowe Kays D   .  DOB: [birth year] Age: 41 Y Sex: Female  .  PCP: [name]   Date: 08/14/2017  .  --------------------------------------------------------------------------------  .  CURRENT MEDICATIONS  .  Taking Accu-Chek Aviva-Blood Glucose Test Strips 1 as directed SQ  8 times per day, Taking Accu-Chek Softclix Lancets 1  Miscellaneous as directed SQ 6 times per day, Taking Aspirin Low  Dose 81 MG Tablet Chewable 1 tablet Orally Once a day, Taking BD  Pen Needle Short U/F 31G X 8 MM Miscellaneous as directed SQ 4  times a day, Taking Colace 100 MG Capsule 1 capsule as needed  Orally Once a day, Taking Humalog KwikPen 100 UNIT/ML Solution  Pen-injector 3 units as directed (max 15 units daily)  Subcutaneous with meals, Taking Iron (Ferrous Gluconate) 256 (28  Fe) MG Tablet 1 tablet Orally Once a day, Taking Lantus 100  UNIT/ML Solution 35 units (max of 45) Subcutaneous Once a day,  Taking NIFEdipine ER 60 MG Tablet Extended Release 24 Hour 1  tablet on an empty stomach Orally Once a day, Taking Prenatal ,  Discontinued Tetanus-Diphth-Acell Pertussis 5-2.5-18.5 LF-MCG/0.5  Suspension as directed Intramuscular , Medication List reviewed  and reconciled with the patient  .  PAST MEDICAL HISTORY  .  Diabetes , Chronic hypertension, Seizures as a child, Pregnancy:  yes.  .  ALLERGIES  .  Penicillin: rash.  .  SURGICAL HISTORY  .  lap cholecystectomy 2010.  [name]  SOCIAL HISTORY  .  .  Tobaccohistory:Never smoked.  .  GYN HISTORY  .  Hx Abnormal pap smear yes, with biopsy in past but no treatment.  Last pap 2015.  Menstrual History LMP 12/05/2016  STD HPV/condyloma, HSV/Herpes - outbreaks rare  Pregnancy #1 Date- /2006, Del- TAB, Comments- No complications  .  OB HISTORY  .  Previous Pregnancies Total Pregnancies: 2, Full Term: 0,  Premature: 0, AB. Induced: 1, AB. Spontaneous: 0, Ectopics: 0,  Multiple Births: 0, Living: 0  .  VITAL SIGNS  .  Pain scale  8/10 hands, Ht-in 66, Wt-lbs 241, BMI 38.898, BP  130/77, BP UE 130, BP LE 77.  .  ASSESSMENTS  .  Pre-existing type 2 diabetes mellitus during pregnancy in third  trimester - O24.113 (Primary)  .  Herpesviral infection, unspecified - B00.9  .  Other viral diseases complicating pregnancy, unspecified  trimester - O98.519  .  Obesity (BMI 30.0-34.9) - E66.9  .  Chronic hypertension during pregnancy - O10.919  .  Elderly multigravida in third trimester - O09.523  .  Pre-existing type 2 diabetes mellitus during pregnancy in second  trimester - O24.112  .  Maternal chronic hypertension in second trimester - O10.912  .  TREATMENT  .  Other viral diseases complicating pregnancy,  unspecified trimester  Start Valtrex Tablet, 500 mg, 1 tablet, Orally, twice a day, 30,  60, Refills 0  .  [name]  Pre-existing type 2 diabetes mellitus during  pregnancy in second trimester  Continue Accu-Chek Aviva-Blood Glucose Test Strips, 1, as  directed, SQ, 8 times per day  Continue Accu-Chek Softclix Lancets Miscellaneous, 1, as  directed, SQ, 6 times per day  Continue BD Pen Needle Short U/F Miscellaneous, 31G X 8 MM, as  directed, SQ, 4 times a day  Continue Humalog KwikPen Solution Pen-injector, 100 UNIT/ML, 3  units as directed (max 15 units daily), Subcutaneous, with meals  Continue  Lantus Solution, 100 UNIT/ML, 35 units (max of 45),  Subcutaneous, Once a day  .  [name]  Maternal chronic hypertension in second trimester  Continue Aspirin Low Dose Tablet Chewable, 81 MG, 1 tablet,  Orally, Once a day  .  [name]  Others  Continue Colace Capsule, 100 MG, 1 capsule as needed, Orally,  Once a day  Continue Iron (Ferrous Gluconate) Tablet, 256 (28 Fe) MG, 1  tablet, Orally, Once a day  Continue NIFEdipine ER Tablet Extended Release 24 Hour, 60 MG, 1  tablet on an empty stomach, Orally, Once a day  Continue Prenatal  .  LABS  .   LAB: Strep Group B Culture  .  [name]  PROCEDURE CODES  .  91478 FLU VAC NO PRSV 4 VAL 3 YRS+, [id number] PRENATAL VISIT  .  FOLLOW  UP  .  1 Week, 2-3 Days, 1 Week  .  Electronically signed by Marlowe Kays , MD on  08/14/2017 at 01:37 PM EDT  .  Document electronically signed by Marlowe Kays D   .

## 2017-08-15 ENCOUNTER — Ambulatory Visit

## 2017-08-17 ENCOUNTER — Ambulatory Visit

## 2017-08-17 ENCOUNTER — Ambulatory Visit: Admitting: Maternal & Fetal Medicine

## 2017-08-17 ENCOUNTER — Ambulatory Visit: Admit: 2017-08-17 | Payer: HMO

## 2017-08-19 LAB — HX MICRO

## 2017-08-21 ENCOUNTER — Ambulatory Visit

## 2017-08-21 ENCOUNTER — Ambulatory Visit: Admitting: Maternal & Fetal Medicine

## 2017-08-21 ENCOUNTER — Ambulatory Visit: Admitting: Internal Medicine

## 2017-08-21 ENCOUNTER — Ambulatory Visit: Admitting: Hand Surgery

## 2017-08-21 ENCOUNTER — Ambulatory Visit: Admit: 2017-08-21 | Payer: HMO

## 2017-08-21 LAB — HX HEM-ROUTINE
HX HCT: 32.7 % (ref 32.0–45.0)
HX HGB: 11.2 g/dL (ref 11.0–15.0)
HX MCH: 31.7 pg (ref 26.0–34.0)
HX MCHC: 34.3 g/dL (ref 32.0–36.0)
HX MCV: 92.6 fL (ref 80.0–98.0)
HX MPV: 9.9 fL (ref 9.1–11.7)
HX NRBC #: 0 10*3/uL
HX NUCLEATED RBC: 0 %
HX PLT: 319 10*3/uL (ref 150–400)
HX RBC BLOOD COUNT: 3.53 M/uL — ABNORMAL LOW (ref 3.70–5.00)
HX RDW: 14.5 % (ref 11.5–14.5)
HX WBC: 6.6 10*3/uL (ref 4.0–11.0)

## 2017-08-21 LAB — HX CHEM-PANELS
HX BLOOD UREA NITROGEN: 15 mg/dL (ref 6–24)
HX CREATININE (CR): 0.81 mg/dL (ref 0.57–1.30)
HX GFR, AFRICAN AMERICAN: 104 mL/min/{1.73_m2}
HX GFR, NON-AFRICAN AMERICAN: 90 mL/min/{1.73_m2}

## 2017-08-21 LAB — HX CHEM-LFT
HX ALANINE AMINOTRANSFERASE (ALT/SGPT): 51 IU/L (ref 0–54)
HX ASPARTATE AMINOTRANFERASE (AST/SGOT): 36 IU/L (ref 10–42)
HX LACTATE DEHYDROGENASE (LDH): 243 IU/L — ABNORMAL HIGH (ref 120–220)

## 2017-08-21 LAB — HX BF-CHEM/URINE
HX CREATININE, RANDOM URINE: 26.66 mg/dL
HX PROTEIN, RANDOM URINE(INCLUDES CREATININE): <7 mg/dL (ref 0–15)

## 2017-08-21 LAB — HX CHEM-OTHER: HX URIC ACID: 4.7 mg/dL (ref 2.6–6.0)

## 2017-08-21 NOTE — Progress Notes (Signed)
**Progress Notes**    ---    **Patient:** [name], EDGINAccount Number:** [id number] **External MRN:** [id number]   **Provider:** Elnora Morrison, MD     **DOB:** 1975/12/30 **Age:** 71 Y **Sex:** Female   **Date:** 08/21/2017     **Phone:** [phone]   **CHN#:** [id number]     **Address:** [address], [place], [place]     **Pcp:** [name]        * * *         **Subjective:**        ---       **Chief Complaints:**    --- ---         --- ---      **Medical History:** Diabetes , Chronic hypertension, Seizures as a child.        --- ---      **Gyn History:** Hx Abnormal pap smear yes, with biopsy in past but no  treatment. Last pap 2015.. Menstrual History LMP 12/05/2016. STD  HPV/condyloma, HSV/Herpes - outbreaks rare. Pregnancy #1 Date- /2006, Del-  TAB, Comments- No complications.    --- ---      **OB History:** Previous Pregnancies Total Pregnancies: 2, Full Term: 0,  Premature: 0, AB. Induced: 1, AB. Spontaneous: 0, Ectopics: 0, Multiple  Births: 0, Living: 0\.    --- ---      **Medications:** Taking Accu-Chek Aviva-Blood Glucose Test Strips 1 as  directed SQ 8 times per day, Taking Accu-Chek Softclix Lancets 1 Miscellaneous  as directed SQ 6 times per day, Taking Aspirin Low Dose 81 MG Tablet Chewable  1 tablet Orally Once a day, Taking BD Pen Needle Short U/F 31G X 8 MM  Miscellaneous as directed SQ 4 times a day, Taking Colace 100 MG Capsule 1  capsule as needed Orally Once a day, Taking Humalog KwikPen 100 UNIT/ML  Solution Pen-injector 3 units as directed (max 15 units daily) Subcutaneous  with meals, Taking Iron (Ferrous Gluconate) 256 (28 Fe) MG Tablet 1 tablet  Orally Once a day, Taking Lantus 100 UNIT/ML Solution 35 units (max of 45)  Subcutaneous Once a day, Taking NIFEdipine ER 60 MG Tablet Extended Release 24  Hour 1 tablet on an empty stomach Orally Once a day, Taking Prenatal , Taking  Valtrex 500 mg Tablet 1 tablet Orally twice a day, Medication List reviewed  and reconciled  with the patient    --- ---       **Allergies:** Penicillin: rash.    --- ---        **Objective:**        ---       **Vitals:** Pain scale 0/10, Ht-in 66, Wt-lbs 245, BMI 39.544, BP 144/76,  132/77, BP UE 144,132, BP LE 76,77.    --- ---         **Assessment:**        ---       **Assessment:**    1\. Other viral diseases complicating pregnancy, third trimester - O98.513  (Primary)    2\. Elderly multigravida in third trimester - O09.523    3\. Chronic hypertension during pregnancy - O10.919    4\. Pre-existing type 2 diabetes mellitus during pregnancy in third trimester  - O24.113    5\. Obesity (BMI 30.0-34.9) - E66.9    6\. Pre-existing type 2 diabetes mellitus during pregnancy in second trimester  - O24.112    7\. Maternal chronic hypertension in second trimester - O10.912    8\.  Other viral diseases complicating pregnancy, unspecified trimester -  O98.519    --- ---        **Plan:**        ---         **1\. Pre-existing type 2 diabetes mellitus during pregnancy in second  trimester**    Continue Accu-Chek Aviva-Blood Glucose Test Strips, 1, as directed, SQ, 8  times per day ; Continue Accu-Chek Softclix Lancets Miscellaneous, 1, as  directed, SQ, 6 times per day ; Continue BD Pen Needle Short U/F  Miscellaneous, 31G X 8 MM, as directed, SQ, 4 times a day ; Continue Humalog  KwikPen Solution Pen-injector, 100 UNIT/ML, 3 units as directed (max 15 units  daily), Subcutaneous, with meals ; Continue Lantus Solution, 100 UNIT/ML, 35  units (max of 45), Subcutaneous, Once a day .    ---    **2\. Maternal chronic hypertension in second trimester**    Continue Aspirin Low Dose Tablet Chewable, 81 MG, 1 tablet, Orally, Once a day  .    **3\. Other viral diseases complicating pregnancy, unspecified trimester**    Continue Valtrex Tablet, 500 mg, 1 tablet, Orally, twice a day .    **4\. Others**    Continue Colace Capsule, 100 MG, 1 capsule as needed, Orally, Once a day ;  Continue Iron (Ferrous Gluconate) Tablet, 256 (28  Fe) MG, 1 tablet, Orally,  Once a day ; Continue NIFEdipine ER Tablet Extended Release 24 Hour, 60 MG, 1  tablet on an empty stomach, Orally, Once a day ; Continue Prenatal .      **Procedure Codes:** [id number] PRENATAL VISIT    --- ---       **Follow Up:** 1 Week, 2-3 Days, 1 Week, 2-3 Days    --- ---    ---    ---          **Provider:** Elnora Morrison, MD    ---     **Patient:** [name], [name] **DOB:** 27-Nov-1975 **Date:** 08/21/2017    ---    Electronically signed by Marlowe Kays , MD on 09/11/2017 at 05:22 PM EST    Sign off status: Completed

## 2017-08-21 NOTE — Progress Notes (Signed)
.  Progress Notes  .  Patient: [name], [name]  Provider: Jimmy Picket    .  DOB: 05-12-1976 Age: 41 Y Sex: Female  .  PCP: [name]   Date: 08/21/2017  .  --------------------------------------------------------------------------------  .  REASON FOR APPOINTMENT  .  1. Bilateral CTS  .  HISTORY OF PRESENT ILLNESS  .  GENERAL:   Niobe is a 41yo RHD female who presents with pain,  numbness/tingling for the last couple of months. She tried a  splint one day/night but did not feel it helped. She had  difficulty keeping the splint on all night and didn't feel it  helped during the day at work while she was typing. SHe does have  underlying insulin-dependent diabetes.  .  CURRENT MEDICATIONS  .  Taking Accu-Chek Aviva-Blood Glucose Test Strips 1 as directed SQ  8 times per day  Taking Accu-Chek Softclix Lancets 1 Miscellaneous as directed SQ  6 times per day  Taking Aspirin Low Dose 81 MG Tablet Chewable 1 tablet Orally  Once a day  Taking BD Pen Needle Short U/F 31G X 8 MM Miscellaneous as  directed SQ 4 times a day  Taking Colace 100 MG Capsule 1 capsule as needed Orally Once a  day  Taking Humalog KwikPen 100 UNIT/ML Solution Pen-injector 3 units  as directed (max 15 units daily) Subcutaneous with meals  Taking Iron (Ferrous Gluconate) 256 (28 Fe) MG Tablet 1 tablet  Orally Once a day  Taking Lantus 100 UNIT/ML Solution 35 units (max of 45)  Subcutaneous Once a day  Taking NIFEdipine ER 60 MG Tablet Extended Release 24 Hour 1  tablet on an empty stomach Orally Once a day  Taking Prenatal  Taking Valtrex 500 mg Tablet 1 tablet Orally twice a day  Medication List reviewed and reconciled with the patient  .  PAST MEDICAL HISTORY  .  Diabetes  Chronic hypertension  Seizures as a child  .  ALLERGIES  .  Penicillin: rash  .  SURGICAL HISTORY  .  lap cholecystectomy 2010  .  FAMILY HISTORY  .  Mother: alive 49 yrs, diagnosed with Hypertension, Diabetes  Father: deceased, diagnosed with Heart Disease in  98  Maternal Grand Mother: alive, diagnosed with Diabetes,  Hypertension  Parnter is 40 and is [place]. No fam hx of congen anomalies, MR,  stillbirth, sickle cell disease. Father- H/o CHFMother - h/o  diabetes and HTN.  .  SOCIAL HISTORY  .  .  Tobaccohistory:Never smoked.  .  Marital Status Single.  .  Work/Occupation: employed .  [name]  Alcohol not currently.  [name]  HOSPITALIZATION/MAJOR DIAGNOSTIC PROCEDURE  .  No Hospitalization History.  [name]  REVIEW OF SYSTEMS  .  ORT:  .  Eyes    No . Ear, Nose Throat    No . Digestion, Stomach, Bowel     No . Bladder Problems    No . Bleeding Problems    No .  Numbness/Tingling    Yes . Anxiety/Depression    Yes .  Fever/Chills/Fatigue    No . Chest Pain/Tightness/Palpitations     No . Skin Rash    No . Dental Problems    No . Joint/Muscle  Pain/Cramps    Yes . Blackout/Fainting    No . Other    No .  .  VITAL SIGNS  .  Pain scale 8, Ht-in 66, Wt-lbs 240, BMI 22.59, BSA 1.72, Ht-cm  167.64, Wt-kg 63.5, Wt Change -101  lb.  .  PHYSICAL EXAMINATION  .  Awake, alert, NADBIlateral UE: sens decreased to light touch in  thumb, IF, MF. Sens intact in small finger. Cap refill intact in  all digits: full digital ROM. NO thenar/intrinsic atrophy;  CTC/phalens test does not increase baseling tingling;.  .  ASSESSMENTS  .  Bilateral carpal tunnel syndrome - G56.03 (Primary)  .  TREATMENT  .  Bilateral carpal tunnel syndrome  Notes: SHe has carpal tunnel syndrome assoc with pregnancy. She  has not tolerated splints although I did suggest she straighten  the metal piece in the splint and try them again. DUe to her DM,  I would only inject one side today. She was counselled that her  sugars may increase over the next several days and she should  adjust her insulin accordingly. If her sugars have normalized, I  would consider injecting the left side next week; however, if her  sugars are still abnormal next week, she will need to postpone  the injection another week. She was also counselled that  normally  this resolves itself after pregnancy, although not in everyone.  [name]  PROCEDURES  .  The right carpal tunnel was injected under sterile  technique today with 1 cc of cortisone and 1 cc of 1% lidocaine  without difficulty. The usual post injection instructions were  given (20526).  [name]  PROCEDURE CODES  .  16109 Injection carpal tunnel  .  U0454 Celestone  .  FOLLOW UP  .  1 Week  .  Electronically signed by Jimmy Picket , M.D. on  08/21/2017 at 01:43 PM EDT  .  Document electronically signed by Jimmy Picket    .

## 2017-08-21 NOTE — Progress Notes (Signed)
Services Due: LDL.      Items recorded during this note:  Added new observation of DIAB EYE EX: ,[place] (06/06/2017 17:44)  Added new observation of MICROALB/CRE: not calculated (08/06/2017 17:43)        Created By [name] MD on 08/21/2017 at 05:43 PM    Electronically Signed By [name] MD on 08/21/2017 at 05:44 PM

## 2017-08-21 NOTE — Progress Notes (Signed)
* * *        [name]**    --- ---    41 Y old Female, DOB: November 26, 1975, External MRN: [id number]    Account Number: [id number]    [address], [place], [place]    Home: [phone]    Insurance: HMO [employer] [employer] Payer ID: PAPER    PCP: [name] Referring: [name] External Visit ID: [id number]    [facility]        * * *    08/21/2017  Progress Notes: Jimmy Picket, MD **CHN#:** [phone]    --- ---    ---        Current Medications    ---    Taking     * Accu-Chek Aviva-Blood Glucose Test Strips 1 as directed SQ 8 times per day    ---    * Accu-Chek Softclix Lancets 1 Miscellaneous as directed SQ 6 times per day    ---    * Aspirin Low Dose 81 MG Tablet Chewable 1 tablet Orally Once a day    ---    * BD Pen Needle Short U/F 31G X 8 MM Miscellaneous as directed SQ 4 times a day    ---    * Colace 100 MG Capsule 1 capsule as needed Orally Once a day    ---    * Humalog KwikPen 100 UNIT/ML Solution Pen-injector 3 units as directed (max 15 units daily) Subcutaneous with meals    ---    * Iron (Ferrous Gluconate) 256 (28 Fe) MG Tablet 1 tablet Orally Once a day    ---    * Lantus 100 UNIT/ML Solution 35 units (max of 45) Subcutaneous Once a day    ---    * NIFEdipine ER 60 MG Tablet Extended Release 24 Hour 1 tablet on an empty stomach Orally Once a day    ---    * Prenatal     ---    * Valtrex 500 mg Tablet 1 tablet Orally twice a day    ---    * Medication List reviewed and reconciled with the patient    ---      Past Medical History    ---       Diabetes .        ---    Chronic hypertension.        ---    Seizures as a child.        ---      Surgical History    ---      lap cholecystectomy 2010    ---      Family History    ---      Mother: alive 42 yrs, diagnosed with Hypertension, Diabetes    ---    Father: deceased, diagnosed with Heart Disease in 72    ---    Maternal Grand Mother: alive, diagnosed with Diabetes, Hypertension    ---     Parnter is 19 and is [place]. No fam hx of congen anomalies, MR, stillbirth,  sickle cell disease.    Father- H/o CHF    Mother - h/o diabetes and HTN.    ---      Social History    ---    Tobacco history: Never smoked.    Marital Status  Single.    Work/Occupation: employed .    Alcohol  not currently.  Allergies    ---      Penicillin: rash    ---    [Allergies Verified]      Hospitalization/Major Diagnostic Procedure    ---      No Hospitalization History.    ---      Review of Systems    ---     _ORT_ :    Eyes No. Ear, Nose Throat No. Digestion, Stomach, Bowel No. Bladder Problems  No. Bleeding Problems No. Numbness/Tingling Yes. Anxiety/Depression Yes.  Fever/Chills/Fatigue No. Chest Pain/Tightness/Palpitations No. Skin Rash No.  Dental Problems No. Joint/Muscle Pain/Cramps Yes. Blackout/Fainting No. Other  No.            Reason for Appointment    ---      1\. Bilateral CTS    ---      History of Present Illness    ---     _GENERAL_ :    [name] is a 41yo RHD female who presents with pain, numbness/tingling for the  last couple of months. She tried a splint one day/night but did not feel it  helped. She had difficulty keeping the splint on all night and didn't feel it  helped during the day at work while she was typing. SHe does have underlying  insulin-dependent diabetes.      Vital Signs    ---    Pain scale 8, Ht-in 66, Wt-lbs 240, BMI 22.59, BSA 1.72, Ht-cm 167.64, Wt-kg  63.5, Wt Change -101 lb.      Physical Examination    ---    Awake, alert, NAD    BIlateral UE: sens decreased to light touch in thumb, IF, MF. Sens intact in  small finger. Cap refill intact in all digits: full digital ROM. NO  thenar/intrinsic atrophy; CTC/phalens test does not increase baseling  tingling;.      Assessments    ---    1\. Bilateral carpal tunnel syndrome - G56.03 (Primary)    ---      Treatment    ---       **1\. Bilateral carpal tunnel syndrome**    Notes: SHe has carpal tunnel syndrome assoc with pregnancy. She  has not  tolerated splints although I did suggest she straighten the metal piece in the  splint and try them again. DUe to her DM, I would only inject one side today.  She was counselled that her sugars may increase over the next several days and  she should adjust her insulin accordingly. If her sugars have normalized, I  would consider injecting the left side next week; however, if her sugars are  still abnormal next week, she will need to postpone the injection another  week. She was also counselled that normally this resolves itself after  pregnancy, although not in everyone.    ---      Procedures    ---    The right carpal tunnel was injected under sterile technique today with 1 cc  of cortisone and 1 cc of 1% lidocaine without difficulty. The usual post  injection instructions were given (20526).      Procedure Codes    ---      84696 Injection carpal tunnel    ---    E9528 Celestone    ---      Follow Up    ---    1 Week    Electronically signed by Jimmy Picket , M.D. on 08/21/2017 at 01:43 PM EDT    Sign off  status: Completed        * * [facility]    [address]    [place], [place] [address]    Tel: [phone]    Fax: [phone]              * * *          Patient: [name], [name] DOB: [birth year] Progress Note: Jimmy Picket, MD  08/21/2017    ---    Note generated by eClinicalWorks EMR/PM Software ([email or web address])

## 2017-08-21 NOTE — Progress Notes (Signed)
.  Progress Notes  .  Patient: [name], [name]  Provider: Marlowe Kays D   .  DOB: [birth year] Age: 41 Y Sex: Female  .  PCP: [name]   Date: 08/21/2017  .  --------------------------------------------------------------------------------  .  CURRENT MEDICATIONS  .  Taking Accu-Chek Aviva-Blood Glucose Test Strips 1 as directed SQ  8 times per day, Taking Accu-Chek Softclix Lancets 1  Miscellaneous as directed SQ 6 times per day, Taking Aspirin Low  Dose 81 MG Tablet Chewable 1 tablet Orally Once a day, Taking BD  Pen Needle Short U/F 31G X 8 MM Miscellaneous as directed SQ 4  times a day, Taking Colace 100 MG Capsule 1 capsule as needed  Orally Once a day, Taking Humalog KwikPen 100 UNIT/ML Solution  Pen-injector 3 units as directed (max 15 units daily)  Subcutaneous with meals, Taking Iron (Ferrous Gluconate) 256 (28  Fe) MG Tablet 1 tablet Orally Once a day, Taking Lantus 100  UNIT/ML Solution 35 units (max of 45) Subcutaneous Once a day,  Taking NIFEdipine ER 60 MG Tablet Extended Release 24 Hour 1  tablet on an empty stomach Orally Once a day, Taking Prenatal ,  Taking Valtrex 500 mg Tablet 1 tablet Orally twice a day,  Medication List reviewed and reconciled with the patient  .  PAST MEDICAL HISTORY  .  Diabetes , Chronic hypertension, Seizures as a child.  .  ALLERGIES  .  Penicillin: rash.  .  GYN HISTORY  .  Hx Abnormal pap smear yes, with biopsy in past but no treatment.  Last pap 2015.  Menstrual History LMP 12/05/2016  STD HPV/condyloma, HSV/Herpes - outbreaks rare  Pregnancy #1 Date- /2006, Del- TAB, Comments- No complications  .  OB HISTORY  .  Previous Pregnancies Total Pregnancies: 2, Full Term: 0,  Premature: 0, AB. Induced: 1, AB. Spontaneous: 0, Ectopics: 0,  Multiple Births: 0, Living: 0  .  VITAL SIGNS  .  Pain scale 0/10, Ht-in 66, Wt-lbs 245, BMI 39.544, BP 144/76,  132/77, BP UE 144,132, BP LE 76,77.  .  ASSESSMENTS  .  Other viral diseases complicating pregnancy, third trimester  -  O98.513 (Primary)  .  Elderly multigravida in third trimester - O09.523  .  Chronic hypertension during pregnancy - O10.919  .  Pre-existing type 2 diabetes mellitus during pregnancy in third  trimester - O24.113  .  Obesity (BMI 30.0-34.9) - E66.9  .  Pre-existing type 2 diabetes mellitus during pregnancy in second  trimester - O24.112  .  Maternal chronic hypertension in second trimester - O10.912  .  Other viral diseases complicating pregnancy, unspecified  trimester - O98.519  .  TREATMENT  .  Pre-existing type 2 diabetes mellitus during  pregnancy in second trimester  Continue Accu-Chek Aviva-Blood Glucose Test Strips, 1, as  directed, SQ, 8 times per day  Continue Accu-Chek Softclix Lancets Miscellaneous, 1, as  directed, SQ, 6 times per day  Continue BD Pen Needle Short U/F Miscellaneous, 31G X 8 MM, as  directed, SQ, 4 times a day  Continue Humalog KwikPen Solution Pen-injector, 100 UNIT/ML, 3  units as directed (max 15 units daily), Subcutaneous, with meals  Continue Lantus Solution, 100 UNIT/ML, 35 units (max of 45),  Subcutaneous, Once a day  .  [name]  Maternal chronic hypertension in second trimester  Continue Aspirin Low Dose Tablet Chewable, 81 MG, 1 tablet,  Orally, Once a day  .  [name]  Other viral diseases complicating pregnancy,  unspecified trimester  Continue Valtrex Tablet, 500 mg, 1 tablet, Orally, twice a day  .  [name]  Others  Continue Colace Capsule, 100 MG, 1 capsule as needed, Orally,  Once a day  Continue Iron (Ferrous Gluconate) Tablet, 256 (28 Fe) MG, 1  tablet, Orally, Once a day  Continue NIFEdipine ER Tablet Extended Release 24 Hour, 60 MG, 1  tablet on an empty stomach, Orally, Once a day  Continue Prenatal  .  PROCEDURE CODES  .  16109 PRENATAL VISIT  .  FOLLOW UP  .  1 Week, 2-3 Days, 1 Week, 2-3 Days  .  Electronically signed by Marlowe Kays , MD on  09/11/2017 at 05:22 PM EST  .  Document electronically signed by Marlowe Kays D   .

## 2017-08-22 ENCOUNTER — Ambulatory Visit

## 2017-08-24 ENCOUNTER — Ambulatory Visit

## 2017-08-24 ENCOUNTER — Ambulatory Visit: Admitting: Maternal & Fetal Medicine

## 2017-08-24 ENCOUNTER — Ambulatory Visit: Admit: 2017-08-24 | Payer: HMO

## 2017-08-27 ENCOUNTER — Ambulatory Visit: Admitting: Maternal & Fetal Medicine

## 2017-08-27 ENCOUNTER — Ambulatory Visit: Admitting: Obstetrics & Gynecology

## 2017-08-27 ENCOUNTER — Ambulatory Visit

## 2017-08-27 ENCOUNTER — Ambulatory Visit: Admit: 2017-08-27 | Payer: HMO

## 2017-08-27 LAB — HX HEM-ROUTINE
HX HCT: 33.7 % (ref 32.0–45.0)
HX HGB: 11.5 g/dL (ref 11.0–15.0)
HX MCH: 31.5 pg (ref 26.0–34.0)
HX MCHC: 34.1 g/dL (ref 32.0–36.0)
HX MCV: 92.3 fL (ref 80.0–98.0)
HX MPV: 9.8 fL (ref 9.1–11.7)
HX NRBC #: 0 10*3/uL
HX NUCLEATED RBC: 0 %
HX PLT: 289 10*3/uL (ref 150–400)
HX RBC BLOOD COUNT: 3.65 M/uL — ABNORMAL LOW (ref 3.70–5.00)
HX RDW: 14.5 % (ref 11.5–14.5)
HX WBC: 6.3 10*3/uL (ref 4.0–11.0)

## 2017-08-27 LAB — HX CHEM-LFT
HX ALANINE AMINOTRANSFERASE (ALT/SGPT): 56 IU/L — ABNORMAL HIGH (ref 0–54)
HX ASPARTATE AMINOTRANFERASE (AST/SGOT): 42 IU/L (ref 10–42)
HX LACTATE DEHYDROGENASE (LDH): 259 IU/L — ABNORMAL HIGH (ref 120–220)

## 2017-08-27 LAB — HX CHEM-PANELS
HX BLOOD UREA NITROGEN: 11 mg/dL (ref 6–24)
HX CREATININE (CR): 0.83 mg/dL (ref 0.57–1.30)
HX GFR, AFRICAN AMERICAN: 101 mL/min/{1.73_m2}
HX GFR, NON-AFRICAN AMERICAN: 87 mL/min/{1.73_m2}

## 2017-08-27 LAB — HX CHEM-OTHER: HX URIC ACID: 5.1 mg/dL (ref 2.6–6.0)

## 2017-08-27 NOTE — Progress Notes (Signed)
.    Progress Notes  .  Patient: [name], [name]  Provider: Vance Peper  MD  .  DOB: 1976/06/03 Age: 41 Y Sex: Female  .  PCP: [name]   Date: 08/27/2017  .  --------------------------------------------------------------------------------  .  CURRENT MEDICATIONS  .  Taking Accu-Chek Aviva-Blood Glucose Test Strips 1 as directed SQ  8 times per day  Taking Accu-Chek Softclix Lancets 1 Miscellaneous as directed SQ  6 times per day  Taking Aspirin Low Dose 81 MG Tablet Chewable 1 tablet Orally  Once a day  Taking BD Pen Needle Short U/F 31G X 8 MM Miscellaneous as  directed SQ 4 times a day  Taking Colace 100 MG Capsule 1 capsule as needed Orally Once a  day  Taking Humalog KwikPen 100 UNIT/ML Solution Pen-injector 3 units  as directed (max 15 units daily) Subcutaneous with meals  Taking Iron (Ferrous Gluconate) 256 (28 Fe) MG Tablet 1 tablet  Orally Once a day  Taking Lantus 100 UNIT/ML Solution 35 units (max of 45)  Subcutaneous Once a day  Taking NIFEdipine ER 60 MG Tablet Extended Release 24 Hour 1  tablet on an empty stomach Orally Once a day  Taking Prenatal  Taking Valtrex 500 mg Tablet 1 tablet Orally twice a day  Medication List reviewed and reconciled with the patient  .  PAST MEDICAL HISTORY  .  Diabetes  Chronic hypertension  Seizures as a child  Pregnancy: yes  .  ALLERGIES  .  Penicillin: rash  .  SURGICAL HISTORY  .  lap cholecystectomy 2010  .  SOCIAL HISTORY  .  .  Tobaccohistory:Never smoked.  .  Marital Status Single.  .  Work/Occupation: employed .  [name]  Alcohol not currently.  .  10/22: plans to stop work tomorrow, take some time for herself  before the baby comes. things ready at home.  .  GYN HISTORY  .  Hx Abnormal pap smear yes, with biopsy in past but no treatment.  Last pap 2015.  Menstrual History LMP 12/05/2016  STD HPV/condyloma, HSV/Herpes - outbreaks rare  Pregnancy #1 Date- /2006, Del- TAB, Comments- No complications  .  OB HISTORY  .  Previous Pregnancies Total Pregnancies: 2, Full  Term: 0,  Premature: 0, AB. Induced: 1, AB. Spontaneous: 0, Ectopics: 0,  Multiple Births: 0, Living: 0  .  VITAL SIGNS  .  Pain scale 5/10 legs swelling, Ht-in 66, Wt-lbs 251.9, BMI  40.658, BP 138/82, 147/80, BP UE 138/147, BP LE 82/80.  .  ASSESSMENTS  .  Elderly multigravida in second trimester - O09.522 (Primary)  .  Diabetes mellitus affecting pregnancy in second trimester -  O24.912  .  Maternal chronic hypertension in second trimester - O10.912  .  Obesity (BMI 30.0-34.9) - E66.9  .  PROCEDURE CODES  .  779AA PRENATAL VISIT N/C MFM  .  FOLLOW UP  .  1 Week, 1wk  .  Electronically signed by Jerene Dilling LIFF MD on  08/31/2017 at 11:11 AM EDT  .  Document electronically signed by LIFF, INGRID  MD  .

## 2017-08-27 NOTE — Progress Notes (Signed)
* * *        [name]**    --- ---    89 Y old Female, DOB: 1976/08/06, External MRN: [id number]    Account Number: [id number]    [address], [place], [place]-[address]    Home: [phone]    Insurance: HMO [employer] Payer ID: PAPER    PCP: [name] Referring: Elnora Morrison External Visit ID: [id number]    Appointment Facility: [facility]        * * *    08/27/2017  Progress Notes: Vance Peper, MD **CHN#:** [id number]    --- ---    ---        Current Medications    ---    Taking     * Accu-Chek Aviva-Blood Glucose Test Strips 1 as directed SQ 8 times per day    ---    * Accu-Chek Softclix Lancets 1 Miscellaneous as directed SQ 6 times per day    ---    * Aspirin Low Dose 81 MG Tablet Chewable 1 tablet Orally Once a day    ---    * BD Pen Needle Short U/F 31G X 8 MM Miscellaneous as directed SQ 4 times a day    ---    * Colace 100 MG Capsule 1 capsule as needed Orally Once a day    ---    * Humalog KwikPen 100 UNIT/ML Solution Pen-injector 3 units as directed (max 15 units daily) Subcutaneous with meals    ---    * Iron (Ferrous Gluconate) 256 (28 Fe) MG Tablet 1 tablet Orally Once a day    ---    * Lantus 100 UNIT/ML Solution 35 units (max of 45) Subcutaneous Once a day    ---    * NIFEdipine ER 60 MG Tablet Extended Release 24 Hour 1 tablet on an empty stomach Orally Once a day    ---    * Prenatal     ---    * Valtrex 500 mg Tablet 1 tablet Orally twice a day    ---    * Medication List reviewed and reconciled with the patient    ---      Past Medical History    ---       Diabetes .        ---    Chronic hypertension.        ---    Seizures as a child.        ---    Pregnancy: yes.        ---      Surgical History    ---      lap cholecystectomy 2010    ---      Social History    ---    Tobacco history: Never smoked.    Marital Status  Single.    Work/Occupation: employed .    Alcohol  not currently.   10/22: plans to stop work tomorrow, take some time  for herself before the  baby comes. things ready at home.    ---      Gyn History    ---    Hx Abnormal pap smear yes, with biopsy in past but no treatment. Last pap  2015..    Menstrual History LMP 12/05/2016.    STD HPV/condyloma, HSV/Herpes - outbreaks rare.    Pregnancy #1 Date- /2006, Del- TAB, Comments- No complications.      OB  History    ---    Previous Pregnancies Total Pregnancies: 2, Full Term: 0, Premature: 0, AB.  Induced: 1, AB. Spontaneous: 0, Ectopics: 0, Multiple Births: 0, Living: 0\.      Allergies    ---      Penicillin: rash    ---    [Allergies Verified]        Vital Signs    ---    Pain scale 5/10 legs swelling, Ht-in 66, Wt-lbs 251.9, BMI 40.658, BP 138/82,  147/80, BP UE 138/147, BP LE 82/80.      Assessments    ---    1\. Elderly multigravida in second trimester - O09.522 (Primary)    ---    2\. Diabetes mellitus affecting pregnancy in second trimester - O24.912    ---    3\. Maternal chronic hypertension in second trimester - O10.912    ---    4\. Obesity (BMI 30.0-34.9) - E66.9    ---      Procedure Codes    ---      161WR PRENATAL VISIT N/C MFM    ---      Follow Up    ---    1 Week, 1wk    Electronically signed by Jerene Dilling Riverlyn Kizziah MD on 08/31/2017 at 11:11 AM EDT    Sign off status: Completed        * * *        [facility]    [address]    [place], [place] [address]    Tel: [phone]    Fax: [phone]              * * *          Patient: LANAIYA, LANTRY DOB: 1976-07-05 Progress Note: [name], MD  08/27/2017    ---    Note generated by eClinicalWorks EMR/PM Software ([email or web address])

## 2017-08-28 ENCOUNTER — Ambulatory Visit: Admitting: Hand Surgery

## 2017-08-28 ENCOUNTER — Ambulatory Visit

## 2017-08-28 NOTE — Progress Notes (Signed)
* * *        **  [name]**    --- ---    18 Y old Female, DOB: Jan 23, 1976    [address], [place], [place] [address]    Home: [phone]    Provider: Jimmy Picket        * * *    Web Encounter    ---    Answered by   Carmin Richmond  Date: 08/28/2017         Time: 11:32 AM    Caller   [name]    --- ---            Reason   Cancel Appointment Request            Message                      Cancel Appointment:      Date: 08/28/2017  Time: 3:15 PM       Reason: 1W FU      Provider: Jimmy Picket       Facility[facility]       ------------------------------------------       Reason For Cancellation: UNABLE TO GET THIS OFF FROM WORK. GOING ON MATERNITY LEAVE WILL RESCHEDULE. THANK YOU.                    * * *                ---          * * *          Patient: [name], Heidi Dakins DOB: 19-Dec-1975 Provider: Jimmy Picket  08/28/2017    ---    Note generated by eClinicalWorks EMR/PM Software ([email or web address])

## 2017-08-30 ENCOUNTER — Ambulatory Visit: Admitting: Maternal & Fetal Medicine

## 2017-08-30 ENCOUNTER — Ambulatory Visit: Admit: 2017-08-30 | Payer: HMO

## 2017-09-04 ENCOUNTER — Ambulatory Visit

## 2017-09-04 ENCOUNTER — Inpatient Hospital Stay
Admit: 2017-09-04 | Disposition: A | Discharge status: Home / Self Care | Source: Ambulatory Visit | Attending: Obstetrics & Gynecology | Admitting: Obstetrics & Gynecology

## 2017-09-04 ENCOUNTER — Inpatient Hospital Stay
Admission: AD | Admit: 2017-09-04 | Discharge: 2017-09-08 | Disposition: A | Discharge status: Home / Self Care | Payer: HMO

## 2017-09-04 LAB — HX CHEM-BLOODGAS
HX BASE EXCESS: -4.2 mmol/L
HX BICARBONATE: 21 meq/L (ref 21–28)
HX CALCULATED CO2: 19 meq/L (ref 17–32)
HX CARBOXYHEMOGLOBIN: 2.5 % (ref 0.0–3.0)
HX METHEMOGLOBIN: 1.6 % (ref 0.0–1.8)
HX OXYGEN SATURATION, MEASURED (SVO2): 55.7 % — ABNORMAL LOW (ref 95.0–98.0)
HX PCO2: 44 mmHg (ref 35–45)
HX PH: 7.31 — ABNORMAL LOW (ref 7.35–7.45)
HX PO2: 27 mmHg — ABNORMAL LOW (ref 85–105)

## 2017-09-04 LAB — HX BF-URINALYSIS
HX KETONES: NEGATIVE mg/dL
HX LEUKOCYTE ES: NEGATIVE
HX NITRITE LEVEL: NEGATIVE
HX SPECIFIC GRAVITY: 1.006
HX U BILIRUBIN: NEGATIVE
HX U BLOOD: NEGATIVE
HX U GLUCOSE: NEGATIVE mg/dL
HX U PH: 7
HX U PROTEIN: NEGATIVE mg/dL
HX U UROBILINIG: 0.2 EU

## 2017-09-04 LAB — HX CHEM-PANELS
HX BLOOD UREA NITROGEN: 14 mg/dL (ref 6–24)
HX CREATININE (CR): 0.82 mg/dL (ref 0.57–1.30)
HX GFR, AFRICAN AMERICAN: 102 mL/min/{1.73_m2}
HX GFR, NON-AFRICAN AMERICAN: 88 mL/min/{1.73_m2}

## 2017-09-04 LAB — HX HEM-ROUTINE
HX HCT: 30.9 % — ABNORMAL LOW (ref 32.0–45.0)
HX HCT: 25.7 % — ABNORMAL LOW (ref 32.0–45.0)
HX HGB: 10.7 g/dL — ABNORMAL LOW (ref 11.0–15.0)
HX HGB: 8.8 g/dL — ABNORMAL LOW (ref 11.0–15.0)
HX MCH: 31.8 pg (ref 26.0–34.0)
HX MCH: 31.4 pg (ref 26.0–34.0)
HX MCHC: 34.6 g/dL (ref 32.0–36.0)
HX MCHC: 34.2 g/dL (ref 32.0–36.0)
HX MCV: 92 fL (ref 80.0–98.0)
HX MCV: 91.8 fL (ref 80.0–98.0)
HX MPV: 9.9 fL (ref 9.1–11.7)
HX MPV: 9.5 fL (ref 9.1–11.7)
HX NRBC #: 0 10*3/uL
HX NRBC #: 0 10*3/uL
HX NUCLEATED RBC: 0 %
HX NUCLEATED RBC: 0 %
HX PLT: 294 10*3/uL (ref 150–400)
HX PLT: 258 10*3/uL (ref 150–400)
HX RBC BLOOD COUNT: 3.36 M/uL — ABNORMAL LOW (ref 3.70–5.00)
HX RBC BLOOD COUNT: 2.8 M/uL — ABNORMAL LOW (ref 3.70–5.00)
HX RDW: 14.9 % — ABNORMAL HIGH (ref 11.5–14.5)
HX RDW: 14.8 % — ABNORMAL HIGH (ref 11.5–14.5)
HX WBC: 5.8 10*3/uL (ref 4.0–11.0)
HX WBC: 13.2 10*3/uL — ABNORMAL HIGH (ref 4.0–11.0)

## 2017-09-04 LAB — HX CHEM-LFT
HX ALANINE AMINOTRANSFERASE (ALT/SGPT): 49 IU/L (ref 0–54)
HX ASPARTATE AMINOTRANFERASE (AST/SGOT): 38 IU/L (ref 10–42)
HX LACTATE DEHYDROGENASE (LDH): 284 IU/L — ABNORMAL HIGH (ref 120–220)

## 2017-09-04 LAB — HX TRANSFUSION
HX ABO-RH INTERPRETATION (GEL): A NEG
HX ANTIBODY SCREEN (GEL): POSITIVE — AB

## 2017-09-04 LAB — HX BF-CHEM/URINE
HX CREATININE, RANDOM URINE: 47.74 mg/dL
HX PROTEIN RANDOM, URINE (INCLUDES CREATININE): 168 mg/g (ref 0–200)
HX PROTEIN, RANDOM URINE(INCLUDES CREATININE): 8 mg/dL (ref 0–15)

## 2017-09-04 LAB — HX CHEM-OTHER: HX URIC ACID: 5.3 mg/dL (ref 2.6–6.0)

## 2017-09-04 LAB — HX POINT OF CARE
HX GLUCOSE-POCT: 177 mg/dL — ABNORMAL HIGH (ref 70–139)
HX GLUCOSE-POCT: 99 mg/dL (ref 70–139)
HX GLUCOSE-POCT: 106 mg/dL (ref 70–139)

## 2017-09-04 NOTE — Op Note (Signed)
Patient    [name], [name]             Med Rec #:  [id number]  Name:  Operation  09/04/2017                Pt.  Dt:                                  Location:  .  [name]                               OPERATIVE REPORT  .  [name]  SURGEON:  Zada Finders, MD  .  ASSISTANTS:  1.  Dillon Bjork, MD  2.  Jackie Plum, MD  .  PREOPERATIVE DIAGNOSES:  Singleton intrauterine pregnancy at 39+0/7 weeks'  gestation, induction of labor for type 2 diabetes, chronic hypertension and  nonreassuring fetal heart tracing remote from delivery.  [name]  POSTOPERATIVE DIAGNOSES:  Mason Jim intrauterine pregnancy at 39+0/7 weeks'  gestation, induction of labor for type 2 diabetes, chronic hypertension and  nonreassuring fetal heart tracing remote from delivery.  [name]  PROCEDURE PERFORMED:  Primary low transverse cesarean section and  post-placental IUD placement.  .  ANESTHESIA:  Spinal.  .  INTRAVENOUS FLUIDS:  2300 mL.  [name]  URINE OUTPUT:  200 mL.  [name]  ESTIMATED BLOOD LOSS:  600 mL.  [name]  FINDINGS:  Viable female infant delivered at 1:01 p.m., weighing 2720 grams  with Apgar scores of 7 and 9 at 1 and 5 minutes respectively.  Normal-appearing uterus, ovaries and fallopian tubes bilaterally, cord  gases of 7.31.  [name]  SPECIMENS:  Placenta.  .  COMPLICATIONS:  None.  .  DISPOSITION:  Stable to recovery.  .  INDICATIONS FOR PROCEDURE:  The patient is a 41 year old gravida 2, para  0-0-1-0 at 26 and 0/7 weeks' gestation.  She presented to Labor and  Delivery on 09/04/17 for induction of labor for type 2 diabetes.  At the  time of admission, she was found to have nonreassuring fetal heart tracing  with minimal variability and spontaneous variable decelerations.  The  decision was made to proceed for cesarean section.  Indication for cesarean  section was nonreassuring fetal status remote from delivery.  Consent for  cesarean section was signed with the patient.  Risks and benefits of the  procedure were discussed including, but not limited to bleeding,  infection,  damage to nearby organs and structures, injury to the baby, possible need  for blood transfusion and possible need for additional surgery.  All  questions were answered and the valid consents were on file.  .  DESCRIPTION OF THE PROCEDURE:  The patient was taken to the operating room  where a timeout was performed to confirm correct patient and correct  procedure.  The patient was prepped and draped in the usual sterile fashion  in the supine position with a left tilt of the hips.  Pressure points were  padded.  Foley catheter was in place and SCD boots were on and working.  Prophylactic IV antibiotics were administered prior to skin incision.  The  fetal heartbeat was checked before the procedure and found to be 140 beats  per minute.  Adequate anesthesia was obtained.  .  OPERATIVE TECHNIQUE:  Using the scalpel, a Pfannenstiel skin incision was  made.  Sharp and blunt dissection was carried  over the subsequent layers of  tissue to the level of fascia with Bovie cautery used to achieve  hemostasis.  The fascia was incised at the midline and the fascial incision  was extended bilaterally bluntly.  The superior edge of the fascia was  grasped with Kocher clamps, tented up and the underlying rectus muscle was  dissected off bluntly and sharply using the curved Mayo scissors.  Attention was then turned to the inferior edge of the fascia, which was  grasped with Kocher clamps, tented up and the underlying rectus muscle was  dissected off bluntly and sharply using the curved Mayo scissors.  The  rectus muscle was divided in the midline.  The peritoneum was identified,  tented up, taking good care to avoid the bladder and entered bluntly.   An  Alexis retractor was inserted for good visualization of the vesicouterine  peritoneum.  A transverse incision was then made across the lower uterine  segment with the scalpel.  The uterine incision was extended bilaterally  with blunt dissection.  The amniotic sac was  entered and noted to be thick  meconium.  The surgeon's hand was then placed into the uterine cavity.  The  fetal head was identified, elevated into the abdomen and delivered through  the uterine incision with the assistance of fundal pressure.  The infant  was examined for nuchal cord x2.  On delivery, the cord was clamped and  cut.  The infant was handed off the field to Pediatrics for further care.  Cord segment and cord blood were obtained for analysis and routine blood  testing.  The placenta was expressed and found to be intact with a 3-vessel  cord.  Oxytocin was administered by IV infusion to enhance uterine  contractions.  The uterus was exteriorized and cleared of all clots and  remaining products of conception.  A Mirena IUD was placed in the fundus.  The strings of the IUD were clamped with a Tresa Endo and placed into the lower  uterine segment through the cervical os.  The uterine incision was  approximated using 0 Vicryl in a running locked fashion.  A second  horizontal imbricating stitch with 0 Vicryl was applied.  The uterus was  replaced into the abdomen, tone was good and the Alexis retractor was  removed.  The fascia was reapproximated using 0 Vicryl in a running  nonlocked fashion.  The subcutaneous tissue was reapproximated with 3-0  plain in a running nonlocked fashion and the skin was approximated using  4-0 Monocryl in a running subcuticular stitch.  All needle, sponge, and  instrument counts were noted to be correct x3 at the end of the procedure  as per the OR staff.  The patient tolerated the procedure well and was  transferred to the recovery room in stable condition.  Dr. Jerene Dilling was  present and scrubbed for all key portions of the procedure.  .  .  .  Electronically Signed  Zada Finders, MD 09/05/2017 10:25 A  .  .  .  .  Dictated by: Jackie Plum, MD  .  D:    09/04/2017  T:    09/04/2017 10:32 P  Dictation ID:  [id number]/Doc#  [id number]  .  cc:  .  [name]      Document is preliminary  until electronically or manually signed by  attending physician.

## 2017-09-05 LAB — HX HEM-ROUTINE
HX HCT: 18.1 % — AB (ref 32.0–45.0)
HX HCT: 26.4 % — ABNORMAL LOW (ref 32.0–45.0)
HX HGB: 6 g/dL — AB (ref 11.0–15.0)
HX HGB: 9.1 g/dL — ABNORMAL LOW (ref 11.0–15.0)
HX MCH: 31.1 pg (ref 26.0–34.0)
HX MCH: 31.2 pg (ref 26.0–34.0)
HX MCHC: 33.1 g/dL (ref 32.0–36.0)
HX MCHC: 34.5 g/dL (ref 32.0–36.0)
HX MCV: 93.8 fL (ref 80.0–98.0)
HX MCV: 90.4 fL (ref 80.0–98.0)
HX MPV: 9.5 fL (ref 9.1–11.7)
HX MPV: 9.1 fL (ref 9.1–11.7)
HX NRBC #: 0 10*3/uL
HX NRBC #: 0.1 10*3/uL — AB
HX NUCLEATED RBC: 0 %
HX NUCLEATED RBC: 1 % — AB
HX PLT: 183 10*3/uL (ref 150–400)
HX PLT: 191 10*3/uL (ref 150–400)
HX RBC BLOOD COUNT: 1.93 M/uL — ABNORMAL LOW (ref 3.70–5.00)
HX RBC BLOOD COUNT: 2.92 M/uL — ABNORMAL LOW (ref 3.70–5.00)
HX RDW: 15.3 % — ABNORMAL HIGH (ref 11.5–14.5)
HX RDW: 15.5 % — ABNORMAL HIGH (ref 11.5–14.5)
HX WBC: 10.3 10*3/uL (ref 4.0–11.0)
HX WBC: 12.3 10*3/uL — ABNORMAL HIGH (ref 4.0–11.0)

## 2017-09-05 LAB — HX POINT OF CARE
HX GLUCOSE-POCT: 56 mg/dL — ABNORMAL LOW (ref 70–139)
HX GLUCOSE-POCT: 65 mg/dL — ABNORMAL LOW (ref 70–139)
HX GLUCOSE-POCT: 81 mg/dL (ref 70–139)
HX GLUCOSE-POCT: 100 mg/dL (ref 70–139)
HX GLUCOSE-POCT: 160 mg/dL — ABNORMAL HIGH (ref 70–139)
HX GLUCOSE-POCT: 191 mg/dL — ABNORMAL HIGH (ref 70–139)

## 2017-09-05 LAB — HX HEM-MISC: HX ACID ELUT: 0 %

## 2017-09-06 LAB — HX POINT OF CARE
HX GLUCOSE-POCT: 85 mg/dL (ref 70–139)
HX GLUCOSE-POCT: 78 mg/dL (ref 70–139)
HX GLUCOSE-POCT: 104 mg/dL (ref 70–139)
HX GLUCOSE-POCT: 92 mg/dL (ref 70–139)

## 2017-09-06 LAB — HX HEM-ROUTINE
HX HCT: 28 % — ABNORMAL LOW (ref 32.0–45.0)
HX HGB: 9.9 g/dL — ABNORMAL LOW (ref 11.0–15.0)
HX MCH: 31.7 pg (ref 26.0–34.0)
HX MCHC: 35.4 g/dL (ref 32.0–36.0)
HX MCV: 89.7 fL (ref 80.0–98.0)
HX MPV: 9.4 fL (ref 9.1–11.7)
HX NRBC #: 0.1 10*3/uL — AB
HX NUCLEATED RBC: 1 % — AB
HX PLT: 224 10*3/uL (ref 150–400)
HX RBC BLOOD COUNT: 3.12 M/uL — ABNORMAL LOW (ref 3.70–5.00)
HX RDW: 16 % — ABNORMAL HIGH (ref 11.5–14.5)
HX WBC: 11.3 10*3/uL — ABNORMAL HIGH (ref 4.0–11.0)

## 2017-09-07 ENCOUNTER — Ambulatory Visit

## 2017-09-07 LAB — HX POINT OF CARE
HX GLUCOSE-POCT: 66 mg/dL — ABNORMAL LOW (ref 70–139)
HX GLUCOSE-POCT: 104 mg/dL (ref 70–139)
HX GLUCOSE-POCT: 114 mg/dL (ref 70–139)
HX GLUCOSE-POCT: 181 mg/dL — ABNORMAL HIGH (ref 70–139)
HX GLUCOSE-POCT: 95 mg/dL (ref 70–139)

## 2017-09-07 NOTE — Discharge Summary (Signed)
Patient Name: [name]            MRN: [id number]  DOB: 01-Nov-1976  Admit Date: 09/04/2017  08:19  Atn Physician: Zada Finders MD  Age: 43Y  Discharge Medication List:  Allergies: No Known Allergies, No Known Drug Allergies, No Known Food Allergies  UserDefinedString2: [facility]  NEWMEDLIST  This is your current medication list; please discard all old medication lists.  Carry this with you at all times including emergency situations and take this to all doctor's appointments.  Please review this list carefully for changes in drugs, doses or frequency with which they are taken and remember to update this list when medications are changed.                                                                                                                                                                                         Take ONLY the medications listed on this form                                                                                                                                                                                                                    docusate sodium   100 mg Capsule, 1 capsule oral twice a day    Additional Instructions:  Entered ON:GEXBM  Vainer                                                                                                                                                          ---------------------------------------------------------------------------------------------------------------------------------------------------------------------------------------  ibuprofen   600 mg Tablet, 1 tablet oral every six hours as needed for PAIN 1-3  Additional Instructions:  Entered ZO:XWRUE  Vainer                                                                                                                                                           ---------------------------------------------------------------------------------------------------------------------------------------------------------------------------------------                                                                                                                                                          insulin glargine (Lantus U-100 Insulin) 100 unit/mL Solution, 18 unit subcutaneous daily    Additional Instructions:  Entered AV:WUJWJXB  Ocie Doyne                                                                                                                                                          ---------------------------------------------------------------------------------------------------------------------------------------------------------------------------------------  NIFEdipine   60 mg tablet extended release 24hr, 1 tablet oral daily    Additional Instructions:  Entered VH:QIONGEX  Jorgensen                                                                                                                                                          ---------------------------------------------------------------------------------------------------------------------------------------------------------------------------------------                                                                                                                                                          norethindrone (contraceptive) (Nora-BE) 0.35 mg Tablet, 1 tablet oral daily    Additional Instructions:  Entered BM:WUXLK  Vainer                                                                                                                                                           ---------------------------------------------------------------------------------------------------------------------------------------------------------------------------------------  oxyCODONE-acetaminophen   5 mg-325 mg Tablet, 1 tablet oral every four hours as needed for PAIN SCALE 1-4  Additional Instructions:  Entered OA:CZYSA  Vainer                                                                                                                                                          ---------------------------------------------------------------------------------------------------------------------------------------------------------------------------------------                                                                                                                                                          PNV cmb#95-ferrous fumarate-FA (Prenatal Formula) 28 mg iron-800 mcg Tablet, 1 tablet oral daily    Additional Instructions:  Entered YT:KZSWF  Vainer                                                                                                                                                          ---------------------------------------------------------------------------------------------------------------------------------------------------------------------------------------  Documents reviewed with Family   /   Healthcare Proxy - Agent   /   Guardian       (circle role)  Discharge Nurse: _______________________________  Name of Unit: [facility]____________________ Phone:  [phone]- ______________  Date: ______________________________ Time:________________________  Electronically signed by:               Pt Name: [name]  MRN: [id number]    Created By  [employer]Log[employer] on 09/07/2017 at 09:27 PM    Electronically Signed By [name] MD on 09/10/2017 at 03:14 PM

## 2017-09-08 ENCOUNTER — Ambulatory Visit

## 2017-09-08 LAB — HX TRANSFUSED PRODUCTS
HX RED CELL PRODUCT: TRANSFUSED
HX RED CELL PRODUCT: TRANSFUSED
HX UNIT RH: NEGATIVE
HX UNIT RH: NEGATIVE
HX UNIT STATUS: TRANSFUSED
HX UNIT STATUS: TRANSFUSED
HX UNIT VOLUME: 300
HX UNIT VOLUME: 300

## 2017-09-08 LAB — HX POINT OF CARE: HX GLUCOSE-POCT: 123 mg/dL (ref 70–139)

## 2017-09-08 MED FILL — OXYCODONE-ACETAMINOPHEN 5-3: 3 days supply | Qty: 20 | Fill #0 | Status: AC

## 2017-09-08 MED FILL — HEATHER 0.35 MG TAB: 28 days supply | Qty: 28 | Fill #0 | Status: AC

## 2017-09-08 MED FILL — PRENATAL VITAMIN PLUS LOW: 30 days supply | Qty: 30 | Fill #0 | Status: AC

## 2017-09-08 MED FILL — *IBUPROFEN 600 MG TAB: 15 days supply | Qty: 60 | Fill #0 | Status: AC

## 2017-09-08 MED FILL — DOK 100 MG CAP: 30 days supply | Qty: 60 | Fill #0 | Status: AC

## 2017-09-17 ENCOUNTER — Ambulatory Visit: Admitting: Maternal & Fetal Medicine

## 2017-09-17 ENCOUNTER — Ambulatory Visit

## 2017-09-17 ENCOUNTER — Ambulatory Visit: Admit: 2017-09-17 | Payer: HMO

## 2017-09-17 NOTE — Progress Notes (Signed)
* * *        [name]**    --- ---    41 Y old Female, DOB: Jan 13, 1976, External MRN: [id number]    Account Number: [id number]    [address], [place], [place]    Home: [phone]    Insurance: H[employer] Payer ID: PAPER    PCP: [name] Referring: [name] External Visit ID: [id number]    Appointment Facility: [facility]        * * *    09/17/2017  Progress Notes: Vance Peper, MD **CHN#:** [phone]    --- ---    ---        Current Medications    ---    Taking     * Accu-Chek Aviva-Blood Glucose Test Strips 1 as directed SQ 8 times per day    ---    * Accu-Chek Softclix Lancets 1 Miscellaneous as directed SQ 6 times per day    ---    * BD Pen Needle Short U/F 31G X 8 MM Miscellaneous as directed SQ 4 times a day    ---    * Colace 100 MG Capsule 1 capsule as needed Orally Once a day    ---    * Humalog KwikPen 100 UNIT/ML Solution Pen-injector 3 units as directed (max 15 units daily) Subcutaneous with meals    ---    * Lantus 100 UNIT/ML Solution 18units (max of 45) Subcutaneous Once a day    ---    * NIFEdipine ER 60 MG Tablet Extended Release 24 Hour 1 tablet on an empty stomach Orally Once a day    ---    * Prenatal     ---    Not-Taking/PRN    * Aspirin Low Dose 81 MG Tablet Chewable 1 tablet Orally Once a day    ---    * Iron (Ferrous Gluconate) 256 (28 Fe) MG Tablet 1 tablet Orally Once a day    ---    * Valtrex 500 mg Tablet 1 tablet Orally twice a day    ---      Past Medical History    ---       Diabetes .        ---    Chronic hypertension.        ---    Seizures as a child.        ---      Surgical History    ---      lap cholecystectomy 2010    ---    cesarean delivery 2018    ---      Social History    ---    Tobacco history: Never smoked.    Marital Status  Single.    Work/Occupation: employed .    Alcohol  not currently.   10/22: plans to stop work tomorrow, take some time  for herself before the baby comes. things ready at home.    11/12: feeling  tired. good support at home. baby doing well - came home from  the nicu last week. no DV. not feeling depressed. EDS negative today.    ---      Gyn History    ---    Hx Abnormal pap smear yes, with biopsy in past but no treatment. Last pap  2015..    Menstrual History LMP 12/05/2016.    STD HPV/condyloma, HSV/Herpes - outbreaks rare.    Pregnancy #  1 Date- /2006, Del- TAB, Comments- No complications.      OB History    ---    Previous Pregnancies Total Pregnancies: 2, Full Term: 0, Premature: 0, AB.  Induced: 1, AB. Spontaneous: 0, Ectopics: 0, Multiple Births: 0, Living: 0\.      Allergies    ---      Penicillin: rash    ---        History of Present Illness    ---     _GENERAL_ :    here today for pp wound check.      Vital Signs    ---    Pain scale 0/10, Ht-in 66, Wt-lbs 234, BMI 37.76, BP 148/80, 136/79.      Physical Examination    ---     _GENERAL_ :    General Appearance: Looks Healthy, no acute distress, pleasant, obese. Abdomen  soft, nontender, nondistended, obese, incision CDI. mild induration suprior to  incision 2cm on left and 2cm on right side. nontender. no erythema. no  drainage..          Assessments    ---    1\. Encounter for postpartum visit - Z39.2 (Primary)    ---    2\. Chronic hypertension affecting pregnancy - O10.919    ---    3\. Obesity affecting pregnancy, antepartum - O99.210    ---    4\. Pre-existing type 2 diabetes mellitus during pregnancy, antepartum -  O24.119    ---      Treatment    ---       **1\. Encounter for postpartum visit**    Notes: here today for wound check - looks great. reassurance given. still  taking nifedipine, but forgot dose today (taking same dose at the beginning of  pregnancy). taking lantus 18u. has follow up scheduled with her  endocrinologist in a few weeks. will take some fingersticks prior to her  appointment so there is a log for review. she has been taking fingersticks  intermittently now, and they have remained in good control she says. It's been  a  long couple of weeks - baby had to be in the NICU, which was unexpected. She  is home now, and doing well. Patient is breast and bottle feeding. Tired, but  denies symptoms of depression. Good support at home. We will follow up in  about 4 weeks.    ---      Procedure Codes    ---      771AB POSTPARTUM N/C MFM    ---      Follow Up    ---    4 Weeks    Electronically signed by Jerene Dilling Brieana Shimmin MD on 09/19/2017 at 05:54 PM EST    Sign off status: Completed        * * *        [facility]    [address]    [place], [place] [address]    Tel: [phone]    Fax: [phone]              * * *          Patient: [name], [name] DOB: Mar 20, 1976 Progress Note: Abdoulaye Drum, MD  09/17/2017    ---    Note generated by eClinicalWorks EMR/PM Software ([email or web address])

## 2017-09-17 NOTE — Progress Notes (Signed)
.  Progress Notes  .  Patient: [name], [name]  Provider: Vance Peper  MD  .  DOB: 08/05/1976 Age: 41 Y Sex: Female  .  PCP: [name]   Date: 09/17/2017  .  --------------------------------------------------------------------------------  .  HISTORY OF PRESENT ILLNESS  .  GENERAL:   here today for pp wound check.  .  CURRENT MEDICATIONS  .  Taking Accu-Chek Aviva-Blood Glucose Test Strips 1 as directed SQ  8 times per day  Taking Accu-Chek Softclix Lancets 1 Miscellaneous as directed SQ  6 times per day  Taking BD Pen Needle Short U/F 31G X 8 MM Miscellaneous as  directed SQ 4 times a day  Taking Colace 100 MG Capsule 1 capsule as needed Orally Once a  day  Taking Humalog KwikPen 100 UNIT/ML Solution Pen-injector 3 units  as directed (max 15 units daily) Subcutaneous with meals  Taking Lantus 100 UNIT/ML Solution 18units (max of 45)  Subcutaneous Once a day  Taking NIFEdipine ER 60 MG Tablet Extended Release 24 Hour 1  tablet on an empty stomach Orally Once a day  Taking Prenatal  Not-Taking/PRN Aspirin Low Dose 81 MG Tablet Chewable 1 tablet  Orally Once a day  Not-Taking/PRN Iron (Ferrous Gluconate) 256 (28 Fe) MG Tablet 1  tablet Orally Once a day  Not-Taking/PRN Valtrex 500 mg Tablet 1 tablet Orally twice a day  .  PAST MEDICAL HISTORY  .  Diabetes  Chronic hypertension  Seizures as a child  .  ALLERGIES  .  Penicillin: rash  .  SURGICAL HISTORY  .  lap cholecystectomy 2010  cesarean delivery 2018  .  SOCIAL HISTORY  .  .  Tobaccohistory:Never smoked.  .  Marital Status Single.  .  Work/Occupation: employed .  [name]  Alcohol not currently.  .  10/22: plans to stop work tomorrow, take some time for herself  before the baby comes. things ready at home.11/12: feeling tired.  good support at home. baby doing well - came home from the nicu  last week. no DV. not feeling depressed. EDS negative today.  .  GYN HISTORY  .  Hx Abnormal pap smear yes, with biopsy in past but no treatment.  Last pap 2015.  Menstrual  History LMP 12/05/2016  STD HPV/condyloma, HSV/Herpes - outbreaks rare  Pregnancy #1 Date- /2006, Del- TAB, Comments- No complications  .  OB HISTORY  .  Previous Pregnancies Total Pregnancies: 2, Full Term: 0,  Premature: 0, AB. Induced: 1, AB. Spontaneous: 0, Ectopics: 0,  Multiple Births: 0, Living: 0  .  VITAL SIGNS  .  Pain scale 0/10, Ht-in 66, Wt-lbs 234, BMI 37.76, BP 148/80,  136/79.  [name]  PHYSICAL EXAMINATION  .  GENERAL:  General Appearance:  Looks Healthy, no acute distress, pleasant,  obese. Abdomen  soft, nontender, nondistended, obese, incision  CDI. mild induration suprior to incision 2cm on left and 2cm on  right side. nontender. no erythema. no drainage..  .  ASSESSMENTS  .  Encounter for postpartum visit - Z39.2 (Primary)  .  Chronic hypertension affecting pregnancy - O10.919  .  Obesity affecting pregnancy, antepartum - O99.210  .  Pre-existing type 2 diabetes mellitus during pregnancy,  antepartum - O24.119  .  TREATMENT  .  Encounter for postpartum visit  Notes: here today for wound check - looks great. reassurance  given. still taking nifedipine, but forgot dose today (taking  same dose at the beginning of pregnancy). taking lantus 18u. has  follow up scheduled with her endocrinologist in a few weeks. will  take some fingersticks prior to her appointment so there is a log  for review. she has been taking fingersticks intermittently now,  and they have remained in good control she says. It's been a long  couple of weeks - baby had to be in the NICU, which was  unexpected. She is home now, and doing well. Patient is breast  and bottle feeding. Tired, but denies symptoms of depression.  Good support at home. We will follow up in about 4 weeks.  [name]  PROCEDURE CODES  .  771AB POSTPARTUM N/C MFM  .  FOLLOW UP  .  4 Weeks  .  Electronically signed by Jerene Dilling LIFF MD on  09/19/2017 at 05:54 PM EST  .  Document electronically signed by LIFF, INGRID  MD  .

## 2017-09-19 LAB — HX COLONOSCOPY

## 2017-09-19 LAB — HX SURGICAL

## 2017-10-22 ENCOUNTER — Ambulatory Visit

## 2017-10-22 ENCOUNTER — Ambulatory Visit: Admitting: Hand Surgery

## 2017-10-22 ENCOUNTER — Ambulatory Visit: Admitting: Maternal & Fetal Medicine

## 2017-10-22 ENCOUNTER — Ambulatory Visit: Admit: 2017-10-22 | Payer: HMO

## 2017-10-22 MED ORDER — Hydrochlorothiazide: 25 | 30 | Freq: Every day | 3 refills | 0 days | Status: AC

## 2017-10-22 NOTE — Progress Notes (Signed)
* * *        [name]**    --- ---    67 Y old Female, DOB: 06-12-1976, External MRN: [id number]    Account Number: [id number]    [address], [place], [id number]    Home: [phone]    Insurance: H[employer] [place] [employer] Payer ID: PAPER    PCP: [name] Referring: [name] External Visit ID: [id number]    Appointment Facility: [facility]        * * *    10/22/2017   **Appointment Provider:** Spero Curb **CHN#:** [id number]    --- ---      **Supervising Provider:** Jimmy Picket, MD    ---         **Current Medications**    ---    Taking     * Accu-Chek Aviva-Blood Glucose Test Strips 1 as directed SQ 8 times per day    ---    * Accu-Chek Softclix Lancets 1 Miscellaneous as directed SQ 6 times per day    ---    * BD Pen Needle Short U/F 31G X 8 MM Miscellaneous as directed SQ 4 times a day    ---    * Colace 100 MG Capsule 1 capsule as needed Orally Once a day    ---    * Humalog KwikPen 100 UNIT/ML Solution Pen-injector 3 units as directed (max 15 units daily) Subcutaneous with meals    ---    * Lantus 100 UNIT/ML Solution 18units (max of 45) Subcutaneous Once a day    ---    * NIFEdipine ER 60 MG Tablet Extended Release 24 Hour 1 tablet on an empty stomach Orally Once a day    ---    * Prenatal     ---    Not-Taking/PRN    * Aspirin Low Dose 81 MG Tablet Chewable 1 tablet Orally Once a day    ---    * Iron (Ferrous Gluconate) 256 (28 Fe) MG Tablet 1 tablet Orally Once a day    ---    * Valtrex 500 mg Tablet 1 tablet Orally twice a day    ---    * Medication List reviewed and reconciled with the patient    ---      Past Medical History    ---       Diabetes .        ---    Chronic hypertension.        ---    Seizures as a child.        ---       **Surgical History**    ---       lap cholecystectomy 2010    ---    cesarean delivery 2018    ---       **Family History**    ---       Mother: alive 54 yrs, diagnosed with Diabetes, Hypertension    ---    Father:  deceased, Heart Disease in 20    ---    Maternal Grand Mother: alive, Diabetes, Hypertension    ---    Parnter is 37 and is [place]. No fam hx of congen anomalies, MR, stillbirth,  sickle cell disease.    Father- H/o CHF    Mother - h/o diabetes and HTN.    ---       **Social History**    ---    Tobacco history:  Never smoked.    Marital Status  Single.    Work/Occupation: employed .    Alcohol  not currently.   10/22: plans to stop work tomorrow, take some time  for herself before the baby comes. things ready at home.    11/12: feeling tired. good support at home. baby doing well - came home from  the nicu last week. no DV. not feeling depressed. EDS negative today.    ---       **Allergies**    ---       Penicillin: rash    ---    [Allergies Verified]       **Hospitalization/Major Diagnostic Procedure**    ---       No Hospitalization History.    ---       **Review of Systems**    ---     _ORT_ :    Eyes No. Ear, Nose Throat No. Digestion, Stomach, Bowel No. Bladder Problems  No. Bleeding Problems No. Numbness/Tingling Yes. Anxiety/Depression No.  Fever/Chills/Fatigue No. Chest Pain/Tightness/Palpitations No. Skin Rash No.  Dental Problems No. Joint/Muscle Pain/Cramps No. Blackout/Fainting No. Other  No.            **Reason for Appointment**    ---       1\. Follow-up bilateral carpal tunnel syndrome.    ---       **History of Present Illness**    ---     _GENERAL_ :    [name] is a very pleasant 41 year old female who comes to clinic for  follow-up of her bilateral carpal tunnel syndrome. She was last seen in clinic  2 months ago, at which point she received a right carpal tunnel injection and  she says that her symptoms are better; however, she still does have some  numbness in the right hand. She gave birth to a baby girl 6 weeks ago, her  symptoms have not gotten any better. On the left side, she has a dense  numbness and tingling in the left thumb, index, and middle fingers. She is  interested in receiving  a left cortisone injection today.       **Vital Signs**    ---    Pain scale 8, Ht-in 66, Ht-cm 167.64.       **Physical Examination**    ---    The patient is in no acute distress and comfortable. She is breathing  comfortably. Focused examination of the left hand and wrist shows that the  skin is intact. There is no erythema or ecchymosis. No gross muscle atrophy or  deformities. She has decreased sensation in the thumb, index, and middle  fingers. She has sensation intact to light touch in the radial and ulnar nerve  distributions. She has a negative Durkan's and Tinel's test at the wrist. She  is able to make a full fist and gain full extension of all digits. She has  AIN, PIN and ulnar motor nerves intact. Her hand and fingers are warm and well  perfused.       **Assessments**    ---    1\. Bilateral carpal tunnel syndrome - G56.03 (Primary)    ---       **Treatment**    ---       **1\. Others**    Notes: [name]' clinical findings were discussed with her in detail in  clinic today. She received a left carpal tunnel injection as detailed in the  procedure section. She was advised if her  symptoms do not improve over time,  she may need surgery in the future. She will return to clinic in 2 months'  time for repeat evaluation at that time. She will call with any further  questions or concerns.    ---      **Procedures**    ---    Left carpal tunnel injection: Informed verbal consent was obtained. Laterality  was confirmed. The left volar wrist was prepped with isopropyl alcohol and the  carpal tunnel was injected with 1 mL of 1% plain lidocaine and 1 mL of  Celestone. A dry sterile bandage was placed. The patient tolerated the  procedure well.       **Procedure Codes**    ---       I6962 Celestone    ---    20526 Injection carpal tunnel    ---       **Follow Up**    ---    2 Months    **Appointment Provider:** Spero Curb    Electronically signed by Jimmy Picket , M.D. on 12/04/2017 at 09:35 AM EST     Sign off status: Completed        * * *        [facility]    [address]    [place], [address]    [place], [place] [address]    Tel: [phone]    Fax: [phone]              * * *          Patient: [name], [name] DOB: [birth year] Progress Note: Spero Curb  10/22/2017    ---    Note generated by eClinicalWorks EMR/PM Software ([email or web address])

## 2017-10-22 NOTE — Progress Notes (Signed)
* * *        [name]**    --- ---    41 Y old Female, DOB: August 11, 1976, External MRN: [id number]    Account Number: [id number]    [address], [place], [address]    Home: [phone]    Insurance: H[employer] [place] [employer] Payer ID: PAPER    PCP: [name] Referring: Vance Peper External Visit ID: [id number]    Appointment Facility: [facility]        * * *    10/22/2017  Progress Notes: Vance Peper, MD **CHN#:** [phone]    --- ---    ---        Current Medications    ---    Taking     * Accu-Chek Aviva-Blood Glucose Test Strips 1 as directed SQ 8 times per day    ---    * Accu-Chek Softclix Lancets 1 Miscellaneous as directed SQ 6 times per day    ---    * BD Pen Needle Short U/F 31G X 8 MM Miscellaneous as directed SQ 4 times a day    ---    * Colace 100 MG Capsule 1 capsule as needed Orally Once a day    ---    * Humalog KwikPen 100 UNIT/ML Solution Pen-injector 3 units as directed (max 15 units daily) Subcutaneous with meals    ---    * Lantus 100 UNIT/ML Solution 18units (max of 45) Subcutaneous Once a day    ---    * NIFEdipine ER 60 MG Tablet Extended Release 24 Hour 1 tablet on an empty stomach Orally Once a day    ---    * Prenatal     ---    Not-Taking/PRN    * Aspirin Low Dose 81 MG Tablet Chewable 1 tablet Orally Once a day    ---    * Iron (Ferrous Gluconate) 256 (28 Fe) MG Tablet 1 tablet Orally Once a day    ---    * Valtrex 500 mg Tablet 1 tablet Orally twice a day    ---    * Medication List reviewed and reconciled with the patient    ---      Past Medical History    ---       Diabetes .        ---    Chronic hypertension.        ---    Seizures as a child.        ---    Breast Feeding: yes.        ---      Surgical History    ---      lap cholecystectomy 2010    ---    cesarean delivery 2018    ---      Family History    ---      Mother: alive 81 yrs, diagnosed with Diabetes, Hypertension    ---    Father: deceased, diagnosed with Heart Disease in 44    ---    Maternal  Grand Mother: alive, diagnosed with Diabetes, Hypertension    ---    Parnter is 30 and is [place]. No fam hx of congen anomalies, MR, stillbirth,  sickle cell disease.    Father- H/o CHF    Mother - h/o diabetes and HTN.    ---      Social History    ---    Tobacco history: Never  smoked.    Marital Status  Single.    Work/Occupation: employed .    Alcohol  not currently.   10/22: plans to stop work tomorrow, take some time  for herself before the baby comes. things ready at home.    11/12: feeling tired. good support at home. baby doing well - came home from  the nicu last week. no DV. not feeling depressed. EDS negative today.    ---      Gyn History    ---    Hx Abnormal pap smear yes, with biopsy in past but no treatment. Last pap  2015..    Menstrual History LMP 12/05/2016.    STD HPV/condyloma, HSV/Herpes - outbreaks rare.    Pregnancy #1 Date- /2006, Del- TAB, Comments- No complications.      OB History    ---    Previous Pregnancies Total Pregnancies: 2, Full Term: 0, Premature: 0, AB.  Induced: 1, AB. Spontaneous: 0, Ectopics: 0, Multiple Births: 0, Living: 0\.      Allergies    ---      Penicillin: rash    ---    Forrestine Him Verified]      Hospitalization/Major Diagnostic Procedure    ---      childbirth 2018    ---      Review of Systems    ---    see above. no fever/chills. no sob or chest pain. no abdominal pain. still  light vaginal bleeding. no cramps. LE edema bilaterally with mild tenderness  bilaterally.        Reason for Appointment    ---      1\. Delivered 09/04/17    ---      History of Present Illness    ---     _GENERAL_ :    41yo now P1 here for 6wk pp visit. Still has LE swelling bilaterally that is  uncomfortable for her. Improves with elevation at night. Also still has carpal  tunnel symptoms, and had left wrist injection today for symptomatic relief.  Has tingling and numbness in first three digits bilaterally.      Vital Signs    ---    Pain scale 6/10 legs swelling, Ht-in 66, Wt-lbs  245, BMI 39.54, BP 129/76.      Physical Examination    ---     _GENERAL_ :    General Appearance: Looks Healthy, well-groomed, alert and oriented, pleasant.  Mood/Affect: pleasant, happy. Abdomen soft, obese, nontender. Extremities 3+  edema bilaterally, symmetric, mildly tender bilaterally, but no severe  tenderness or point tenderness concerning for DVT.[name]          Assessments    ---    1\. Chronic hypertension affecting pregnancy - O10.919 (Primary)    ---    2\. Encounter for postpartum visit - Z39.2    ---    3\. Long term current use of insulin - Z79.4    ---    4\. Type 2 diabetes mellitus without complications - E11.9    ---    5\. Encounter for other general counseling or advice on contraception - Z30.09    ---      Treatment    ---       **1\. Chronic hypertension affecting pregnancy**    Start Hydrochlorothiazide Tablet, 25 mg, 1 tablet in the morning, Orally, Once  a day, 30 day(s), 30, Refills 3    Stop NIFEdipine ER Tablet Extended Release 24 Hour, 60 MG, 1 tablet  on an  empty stomach, Orally, Once a day    Notes: still with 3+ edema LE bilaterally and carpal tunnel symptoms (which  developed for the first time during pregnancy). BP normal range today on  nifedipine XL 60mg  daily. Discussed history of cHTN today. Ms. Neyer was on  lisinopril prior to pregnancy, stopped when found out she was pregnant. Still  breastfeeding and bottle feeding.    Appreciate phone consultation with cardiology fellow Dr. Martin Majestic today.  Discussed options for transitioning to diurretic medication. Possible  nifedipine is contributing to edema. Will stop nifedipine. Will START HCTZ  25mg  PO daily (will take tomorrow in am instead of nifedipine). 1wk too early  for patient to return for follow up due to appts for baby girl next week. Will  return for follow up in 2 weeks on 12/31. Advised to increased potassium in  diet with bananas, gatorade, for example. Will plan BP check and Chem 7 for  monitoring in 2 weeks. If BP  elevated, ok to increase HCTZ to 50mg  daily.  Hypo/hypertension precautions reviewed. To call if any symptoms prior to  scheduled f/u visit.    pt to fax FMLA paperwork. important for improvement of edema and BP control  prior to return to work after cesarean delivery.    ---        **2\. Encounter for postpartum visit**    Notes: baby well at home. still breast and bottle feeding. depression screen  negative. does not want full physical exam today. does not want Mirena IUD  today. knows she needs a pap with her next physical for cervical cancer  screening. Will plan for full physical exam and mirena iud insertion on 12/31  at time of follow up. (note post placental mirena iud placed, but expulsed at  the time of pp hemorrhage.).        **3\. Type 2 diabetes mellitus without complications**    Notes: continues on lantus 35u daily. reports FS well controlled although did  not bring log today.      Follow Up    ---    2 Weeks    Electronically signed by Jerene Dilling Hanadi Stanly MD on 10/22/2017 at 02:50 PM EST    Sign off status: Completed        * * *        [facility]    [address]    [place], [place] [address]    Tel: [phone]    Fax: [phone]              * * *          Patient: [name], HICKAM DOB: 19-Dec-1975 Progress Note: Takhia Spoon, MD  10/22/2017    ---    Note generated by eClinicalWorks EMR/PM Software ([email or web address])

## 2017-10-22 NOTE — Progress Notes (Signed)
.  Progress Notes  .  Patient: [name], [name]  Provider: Spero Curb    .  DOB:Apr 21, 1976 Age: 41 Y Sex: Female  Supervising Provider:: Jimmy Picket, MD  Date: 10/22/2017  .  PCP: [name]   Date: 10/22/2017  .  --------------------------------------------------------------------------------  .  REASON FOR APPOINTMENT  .  1. Follow-up bilateral carpal tunnel syndrome.  [name]  HISTORY OF PRESENT ILLNESS  .  GENERAL:  [name] is a very pleasant 40 year old  female who comes to clinic for follow-up of her bilateral carpal  tunnel syndrome. She was last seen in clinic 2 months ago, at  which point she received a right carpal tunnel injection and she  says that her symptoms are better; however, she still does have  some numbness in the right hand. She gave birth to a baby girl 6  weeks ago, her symptoms have not gotten any better. On the left  side, she has a dense numbness and tingling in the left thumb,  index, and middle fingers. She is interested in receiving a left  cortisone injection today.  .  CURRENT MEDICATIONS  .  Taking Accu-Chek Aviva-Blood Glucose Test Strips 1 as directed SQ  8 times per day  Taking Accu-Chek Softclix Lancets 1 Miscellaneous as directed SQ  6 times per day  Taking BD Pen Needle Short U/F 31G X 8 MM Miscellaneous as  directed SQ 4 times a day  Taking Colace 100 MG Capsule 1 capsule as needed Orally Once a  day  Taking Humalog KwikPen 100 UNIT/ML Solution Pen-injector 3 units  as directed (max 15 units daily) Subcutaneous with meals  Taking Lantus 100 UNIT/ML Solution 18units (max of 45)  Subcutaneous Once a day  Taking NIFEdipine ER 60 MG Tablet Extended Release 24 Hour 1  tablet on an empty stomach Orally Once a day  Taking Prenatal  Not-Taking/PRN Aspirin Low Dose 81 MG Tablet Chewable 1 tablet  Orally Once a day  Not-Taking/PRN Iron (Ferrous Gluconate) 256 (28 Fe) MG Tablet 1  tablet Orally Once a day  Not-Taking/PRN Valtrex 500 mg Tablet 1 tablet Orally twice a  day  Medication List reviewed and reconciled with the patient  .  PAST MEDICAL HISTORY  .  Diabetes  Chronic hypertension  Seizures as a child  .  ALLERGIES  .  Penicillin: rash  .  SURGICAL HISTORY  .  lap cholecystectomy 2010  cesarean delivery 2018  .  FAMILY HISTORY  .  Mother: alive 55 yrs, diagnosed with Diabetes, Hypertension  Father: deceased, Heart Disease in 34  Maternal Grand Mother: alive, Diabetes, Hypertension  Parnter is 41 and is [place]. No fam hx of congen anomalies, MR,  stillbirth, sickle cell disease. Father- H/o CHFMother - h/o  diabetes and HTN.  .  SOCIAL HISTORY  .  .  Tobaccohistory:Never smoked.  .  Marital Status Single.  .  Work/Occupation: employed .  [name]  Alcohol not currently.  .  10/22: plans to stop work tomorrow, take some time for herself  before the baby comes. things ready at home.11/12: feeling tired.  good support at home. baby doing well - came home from the nicu  last week. no DV. not feeling depressed. EDS negative today.  [name]  HOSPITALIZATION/MAJOR DIAGNOSTIC PROCEDURE  .  No Hospitalization History.  [name]  REVIEW OF SYSTEMS  .  ORT:  .  Eyes    No . Ear, Nose Throat    No . Digestion, Stomach, Bowel  No . Bladder Problems    No . Bleeding Problems    No .  Numbness/Tingling    Yes . Anxiety/Depression    No .  Fever/Chills/Fatigue    No . Chest Pain/Tightness/Palpitations     No . Skin Rash    No . Dental Problems    No . Joint/Muscle  Pain/Cramps    No . Blackout/Fainting    No . Other    No .  .  VITAL SIGNS  .  Pain scale 8, Ht-in 66, Ht-cm 167.64.  [name]  PHYSICAL EXAMINATION  .  The patient is in no acute distress and comfortable. She is  breathing comfortably. Focused examination of the left hand and  wrist shows that the skin is intact. There is no erythema or  ecchymosis. No gross muscle atrophy or deformities. She has  decreased sensation in the thumb, index, and middle fingers. She  has sensation intact to light touch in the radial and ulnar nerve  distributions. She  has a negative Durkan's and Tinel's test at  the wrist. She is able to make a full fist and gain full  extension of all digits. She has AIN, PIN and ulnar motor nerves  intact. Her hand and fingers are warm and well perfused.  .  ASSESSMENTS  .  Bilateral carpal tunnel syndrome - G56.03 (Primary)  .  TREATMENT  .  Others  Notes: Ms. Schorr' clinical findings were discussed with her in  detail in clinic today. She received a left carpal tunnel  injection as detailed in the procedure section. She was advised  if her symptoms do not improve over time, she may need surgery in  the future. She will return to clinic in 2 months' time for  repeat evaluation at that time. She will call with any further  questions or concerns.  [name]  PROCEDURES  .  Left carpal tunnel injection: Informed verbal  consent was obtained. Laterality was confirmed. The left volar  wrist was prepped with isopropyl alcohol and the carpal tunnel  was injected with 1 mL of 1% plain lidocaine and 1 mL of  Celestone. A dry sterile bandage was placed. The patient  tolerated the procedure well.  [name]  PROCEDURE CODES  .  O1308 Celestone  .  65784 Injection carpal tunnel  .  FOLLOW UP  .  2 Months  .  [name]  Appointment Provider: Spero Curb  .  Electronically signed by Jimmy Picket , M.D. on  12/04/2017 at 09:35 AM EST  .  CONFIRMATORY SIGN OFF  .  [name]  Document electronically signed by Spero Curb    .

## 2017-10-22 NOTE — Progress Notes (Signed)
.  Progress Notes  .  Patient: [name], [name]  Provider: Vance Peper  MD  .  DOB: August 27, 1976 Age: 41 Y Sex: Female  .  PCP: [name]   Date: 10/22/2017  .  --------------------------------------------------------------------------------  .  REASON FOR APPOINTMENT  .  1. Delivered 09/04/17  .  HISTORY OF PRESENT ILLNESS  .  GENERAL:   41yo now P1 here for 6wk pp visit. Still has LE swelling  bilaterally that is uncomfortable for her. Improves with  elevation at night. Also still has carpal tunnel symptoms, and  had left wrist injection today for symptomatic relief. Has  tingling and numbness in first three digits bilaterally.  .  CURRENT MEDICATIONS  .  Taking Accu-Chek Aviva-Blood Glucose Test Strips 1 as directed SQ  8 times per day  Taking Accu-Chek Softclix Lancets 1 Miscellaneous as directed SQ  6 times per day  Taking BD Pen Needle Short U/F 31G X 8 MM Miscellaneous as  directed SQ 4 times a day  Taking Colace 100 MG Capsule 1 capsule as needed Orally Once a  day  Taking Humalog KwikPen 100 UNIT/ML Solution Pen-injector 3 units  as directed (max 15 units daily) Subcutaneous with meals  Taking Lantus 100 UNIT/ML Solution 18units (max of 45)  Subcutaneous Once a day  Taking NIFEdipine ER 60 MG Tablet Extended Release 24 Hour 1  tablet on an empty stomach Orally Once a day  Taking Prenatal  Not-Taking/PRN Aspirin Low Dose 81 MG Tablet Chewable 1 tablet  Orally Once a day  Not-Taking/PRN Iron (Ferrous Gluconate) 256 (28 Fe) MG Tablet 1  tablet Orally Once a day  Not-Taking/PRN Valtrex 500 mg Tablet 1 tablet Orally twice a day  Medication List reviewed and reconciled with the patient  .  PAST MEDICAL HISTORY  .  Diabetes  Chronic hypertension  Seizures as a child  Breast Feeding: yes  .  ALLERGIES  .  Penicillin: rash  .  SURGICAL HISTORY  .  lap cholecystectomy 2010  cesarean delivery 2018  .  FAMILY HISTORY  .  Mother: alive 55 yrs, diagnosed with Diabetes, Hypertension  Father: deceased, diagnosed with  Heart Disease in 21  Maternal Grand Mother: alive, diagnosed with Diabetes,  Hypertension  Parnter is 55 and is [place]. No fam hx of congen anomalies, MR,  stillbirth, sickle cell disease. Father- H/o CHFMother - h/o  diabetes and HTN.  .  SOCIAL HISTORY  .  .  Tobaccohistory:Never smoked.  .  Marital Status Single.  .  Work/Occupation: employed .  [name]  Alcohol not currently.  .  10/22: plans to stop work tomorrow, take some time for herself  before the baby comes. things ready at home.11/12: feeling tired.  good support at home. baby doing well - came home from the nicu  last week. no DV. not feeling depressed. EDS negative today.  .  GYN HISTORY  .  Hx Abnormal pap smear yes, with biopsy in past but no treatment.  Last pap 2015.  Menstrual History LMP 12/05/2016  STD HPV/condyloma, HSV/Herpes - outbreaks rare  Pregnancy #1 Date- /2006, Del- TAB, Comments- No complications  .  OB HISTORY  .  Previous Pregnancies Total Pregnancies: 2, Full Term: 0,  Premature: 0, AB. Induced: 1, AB. Spontaneous: 0, Ectopics: 0,  Multiple Births: 0, Living: 0  .  HOSPITALIZATION/MAJOR DIAGNOSTIC PROCEDURE  .  childbirth 2018  .  REVIEW OF SYSTEMS  .  see above. no fever/chills. no sob or  chest pain. no abdominal  pain. still light vaginal bleeding. no cramps. LE edema  bilaterally with mild tenderness bilaterally.  [name]  VITAL SIGNS  .  Pain scale 6/10 legs swelling, Ht-in 66, Wt-lbs 245, BMI 39.54,  BP 129/76.  [name]  PHYSICAL EXAMINATION  .  GENERAL:  General Appearance:  Looks Healthy, well-groomed, alert and  oriented, pleasant. Mood/Affect:  pleasant, happy. Abdomen  soft,  obese, nontender. Extremities  3+ edema bilaterally, symmetric,  mildly tender bilaterally, but no severe tenderness or point  tenderness concerning for DVT..  .  ASSESSMENTS  .  Chronic hypertension affecting pregnancy - O10.919 (Primary)  .  Encounter for postpartum visit - Z39.2  .  Long term current use of insulin - Z79.4  .  Type 2 diabetes mellitus without  complications - E11.9  .  Encounter for other general counseling or advice on contraception  - Z30.09  .  TREATMENT  .  Chronic hypertension affecting pregnancy  Start Hydrochlorothiazide Tablet, 25 mg, 1 tablet in the morning,  Orally, Once a day, 30 day(s), 30, Refills 3  Stop NIFEdipine ER Tablet Extended Release 24 Hour, 60 MG, 1  tablet on an empty stomach, Orally, Once a day  Notes: still with 3+ edema LE bilaterally and carpal tunnel  symptoms (which developed for the first time during pregnancy).  BP normal range today on nifedipine XL 60mg  daily. Discussed  history of cHTN today. Ms. Golomb was on lisinopril prior to  pregnancy, stopped when found out she was pregnant. Still  breastfeeding and bottle feeding. Appreciate phone consultation  with cardiology fellow Dr. Martin Majestic today. Discussed options for  transitioning to diurretic medication. Possible nifedipine is  contributing to edema. Will stop nifedipine. Will START HCTZ 25mg   PO daily (will take tomorrow in am instead of nifedipine). 1wk  too early for patient to return for follow up due to appts for  baby girl next week. Will return for follow up in 2 weeks on  12/31. Advised to increased potassium in diet with bananas,  gatorade, for example. Will plan BP check and Chem 7 for  monitoring in 2 weeks. If BP elevated, ok to increase HCTZ to  50mg  daily. Hypo/hypertension precautions reviewed. To call if  any symptoms prior to scheduled f/u visit. pt to fax FMLA  paperwork. important for improvement of edema and BP control  prior to return to work after cesarean delivery.  .  .  Encounter for postpartum visit  Notes: baby well at home. still breast and bottle feeding.  depression screen negative. does not want full physical exam  today. does not want Mirena IUD today. knows she needs a pap with  her next physical for cervical cancer screening. Will plan for  full physical exam and mirena iud insertion on 12/31 at time of  follow up. (note post placental  mirena iud placed, but expulsed  at the time of pp hemorrhage.).  [name]  .  Type 2 diabetes mellitus without complications  Notes: continues on lantus 35u daily. reports FS well controlled  although did not bring log today.  .  FOLLOW UP  .  2 Weeks  .  Electronically signed by Jerene Dilling LIFF MD on  10/22/2017 at 02:50 PM EST  .  Document electronically signed by LIFF, INGRID  MD  .

## 2017-11-05 ENCOUNTER — Ambulatory Visit: Admitting: Maternal & Fetal Medicine

## 2017-11-07 ENCOUNTER — Ambulatory Visit

## 2017-11-07 ENCOUNTER — Ambulatory Visit: Admitting: Obstetrics & Gynecology

## 2017-11-07 ENCOUNTER — Ambulatory Visit: Admit: 2017-11-07 | Payer: HMO

## 2017-11-07 LAB — HX CHEM-PANELS
HX ANION GAP: 6 (ref 3–14)
HX BLOOD UREA NITROGEN: 16 mg/dL (ref 6–24)
HX CHLORIDE (CL): 100 meq/L (ref 98–110)
HX CO2: 29 meq/L (ref 20–30)
HX CREATININE (CR): 1.05 mg/dL (ref 0.57–1.30)
HX GFR, AFRICAN AMERICAN: 76 mL/min/{1.73_m2}
HX GFR, NON-AFRICAN AMERICAN: 65 mL/min/{1.73_m2}
HX GLUCOSE: 303 mg/dL — ABNORMAL HIGH (ref 70–139)
HX POTASSIUM (K): 3.9 meq/L (ref 3.6–5.1)
HX SODIUM (NA): 135 meq/L (ref 135–145)

## 2017-11-07 LAB — HX CHEM-OTHER
HX CALCIUM (CA): 9.7 mg/dL (ref 8.5–10.5)
HX MAGNESIUM: 1.9 mg/dL (ref 1.6–2.6)
HX PHOSPHORUS: 3.8 mg/dL (ref 2.7–4.5)

## 2017-11-07 LAB — HX DIABETES: HX GLUCOSE: 303 mg/dL — ABNORMAL HIGH (ref 70–139)

## 2017-11-07 NOTE — Progress Notes (Signed)
**Progress Notes**    ---    **Patient:** Heidi, SAMUDIOAccount Number:** [id number] **External MRN:** [id number]   **Provider:** Domenic Schwab, MD     **DOB:** 1976-08-29 **Age:** 18 Y **Sex:** Female   **Date:** 11/07/2017     **Phone:** [phone]   **CHN#:** [id number]     **Address:** [address], [place], [id number]     **Pcp:** [name]        * * *         **Subjective:**        ---       **Chief Complaints:**    --- ---         --- ---      **Medical History:** Diabetes , Chronic hypertension, Seizures as a child.        --- ---      **Gyn History:** Hx Abnormal pap smear yes, with biopsy in past but no  treatment. Last pap 2015.. Menstrual History LMP 12/05/2016. STD  HPV/condyloma, HSV/Herpes - outbreaks rare. Pregnancy #1 Date- /2006, Del-  TAB, Comments- No complications.    --- ---      **OB History:** Previous Pregnancies Total Pregnancies: 2, Full Term: 0,  Premature: 0, AB. Induced: 1, AB. Spontaneous: 0, Ectopics: 0, Multiple  Births: 0, Living: 0\.    --- ---      **Medications:** Taking Accu-Chek Aviva-Blood Glucose Test Strips 1 as  directed SQ 8 times per day, Taking Accu-Chek Softclix Lancets 1 Miscellaneous  as directed SQ 6 times per day, Taking BD Pen Needle Short U/F 31G X 8 MM  Miscellaneous as directed SQ 4 times a day, Taking Colace 100 MG Capsule 1  capsule as needed Orally Once a day, Taking Humalog KwikPen 100 UNIT/ML  Solution Pen-injector 3 units as directed (max 15 units daily) Subcutaneous  with meals, Taking Hydrochlorothiazide 25 mg Tablet 1 tablet in the morning  Orally Once a day, Taking Lantus 100 UNIT/ML Solution 18units (max of 45)  Subcutaneous Once a day, Taking Prenatal , Not-Taking/PRN Aspirin Low Dose 81  MG Tablet Chewable 1 tablet Orally Once a day, Not-Taking/PRN Iron (Ferrous  Gluconate) 256 (28 Fe) MG Tablet 1 tablet Orally Once a day, Not-Taking/PRN  Valtrex 500 mg Tablet 1 tablet Orally twice a day    --- ---       **Allergies:** Penicillin:  rash.    --- ---        **Objective:**        ---       **Vitals:** Pain scale 0/10, Ht-in 66, Wt-lbs 243, BMI 39.22, BP 145/81, HR  77, RR 18, BSA 2.26, Ht-cm 167.64, Wt-kg 110.22, Wt Change -2 lb    Pt to MFM clinic for b/p check and chem 10 order per Dr Vicenta Aly. VSS 145/81,  swelling drastically improved. pt down 2 more pounds. Denies any h/a or  epigastric pain. Pt feeling well. Will monitor chem 10 results and pt to keep  appt scheduled next monday with Dr Vicenta Aly.    --- ---         **Assessment:**        ---         **Plan:**        ---         --- ---    ---    ---          **Provider:** Domenic Schwab, MD    ---     **Patient:**  Heidi, [name] **DOB:** 1976/11/05 **Date:** 11/07/2017    ---    Electronically signed by Debbora Dus on 11/07/2017 at 01:33 PM EST    Sign off status: Completed

## 2017-11-07 NOTE — Progress Notes (Signed)
.    Progress Notes  .  Patient: [name], [name]  Provider: Domenic Schwab  MD  .  DOB: [birth year] Age: 42 Y Sex: Female  .  PCP: [name]   Date: 11/07/2017  .  --------------------------------------------------------------------------------  .  CURRENT MEDICATIONS  .  Taking Accu-Chek Aviva-Blood Glucose Test Strips 1 as directed SQ  8 times per day, Taking Accu-Chek Softclix Lancets 1  Miscellaneous as directed SQ 6 times per day, Taking BD Pen  Needle Short U/F 31G X 8 MM Miscellaneous as directed SQ 4 times  a day, Taking Colace 100 MG Capsule 1 capsule as needed Orally  Once a day, Taking Humalog KwikPen 100 UNIT/ML Solution  Pen-injector 3 units as directed (max 15 units daily)  Subcutaneous with meals, Taking Hydrochlorothiazide 25 mg Tablet  1 tablet in the morning Orally Once a day, Taking Lantus 100  UNIT/ML Solution 18units (max of 45) Subcutaneous Once a day,  Taking Prenatal , Not-Taking/PRN Aspirin Low Dose 81 MG Tablet  Chewable 1 tablet Orally Once a day, Not-Taking/PRN Iron (Ferrous  Gluconate) 256 (28 Fe) MG Tablet 1 tablet Orally Once a day,  Not-Taking/PRN Valtrex 500 mg Tablet 1 tablet Orally twice a day  .  PAST MEDICAL HISTORY  .  Diabetes , Chronic hypertension, Seizures as a child.  .  ALLERGIES  .  Penicillin: rash.  .  GYN HISTORY  .  Hx Abnormal pap smear yes, with biopsy in past but no treatment.  Last pap 2015.  Menstrual History LMP 12/05/2016  STD HPV/condyloma, HSV/Herpes - outbreaks rare  Pregnancy #1 Date- /2006, Del- TAB, Comments- No complications  .  OB HISTORY  .  Previous Pregnancies Total Pregnancies: 2, Full Term: 0,  Premature: 0, AB. Induced: 1, AB. Spontaneous: 0, Ectopics: 0,  Multiple Births: 0, Living: 0  .  VITAL SIGNS  .  Pain scale 0/10, Ht-in 66, Wt-lbs 243, BMI 39.22, BP 145/81, HR  77, RR 18, BSA 2.26, Ht-cm 167.64, Wt-kg 110.22, Wt Change -2  lbPt to MFM clinic for b/p check and chem 10 order per Dr Vicenta Aly.  VSS 145/81, swelling drastically improved. pt down 2  more pounds.  Denies any h/a or epigastric pain. Pt feeling well. Will monitor  chem 10 results and pt to keep appt scheduled next monday with Dr  Vicenta Aly.  .  Electronically signed by Debbora Dus on 11/07/2017 at  01:33 PM EST  .  Document electronically signed by Domenic Schwab  MD  .

## 2017-11-08 ENCOUNTER — Ambulatory Visit: Admitting: Maternal & Fetal Medicine

## 2017-11-08 NOTE — Progress Notes (Signed)
* * *        **  [name]**    --- ---    79 Y old Female, DOB: [birth year]    [address], [place], [place] [address]    Home: [phone]    Provider: Vance Peper, MD        * * *    Telephone Encounter    ---    Answered by   Debbora Dus  Date: 11/08/2017         Time: 09:46 AM    Caller   [name]    --- ---            Reason   didn't take insulin            Message                      [name] returned phone call regarding elevated sugar of 303. Pt stated that she did not take her insulin yesterday morning and that she really needs to get back in to the swing of things. This rn explained the importancy and dangers of not taking her insulin. Dierdre Highman agrees. Pt did take her Lantus 30units sq this am and will check her sugars diligently and report sugars >200. Pt to keep her scheduled appt for Monday with Dr Vicenta Aly. Dr Vicenta Aly aware.                Action Taken   Lane,Beth 11/08/2017 9:49:06 AM >                * * *                ---          * * *          PatientAPARNA, [name] DOB: February 24, 1976 Provider: Vance Peper, MD 11/08/2017    ---    Note generated by e[employer] EMR/PM Software ([email or web address])

## 2017-11-12 ENCOUNTER — Ambulatory Visit: Admitting: Maternal & Fetal Medicine

## 2017-11-12 ENCOUNTER — Ambulatory Visit

## 2017-11-12 ENCOUNTER — Ambulatory Visit (HOSPITAL_BASED_OUTPATIENT_CLINIC_OR_DEPARTMENT_OTHER): Admitting: Psychiatry

## 2017-11-12 ENCOUNTER — Ambulatory Visit: Admit: 2017-11-12 | Payer: HMO

## 2017-11-12 LAB — HX POINT OF CARE: HX GLUCOSE-POCT: 197 mg/dL — ABNORMAL HIGH (ref 70–139)

## 2017-11-12 NOTE — Progress Notes (Signed)
* * *        [name]**    --- ---    4 Y old Female, DOB: 26-Jul-1976, External MRN: [id number]    Account Number: [id number]    [address], [place], [place]-[address]    Home: [phone]    Insurance: HMO [employer] Payer ID: PAPER    PCP: [name] Referring: [name] External Visit ID: [id number]    Appointment Facility: [facility]        * * *    11/12/2017  Progress Notes: Vance Peper, MD **CHN#:** [id number]    --- ---    ---        Current Medications    ---    Taking     * Accu-Chek Aviva-Blood Glucose Test Strips 1 as directed SQ 8 times per day    ---    * Accu-Chek Softclix Lancets 1 Miscellaneous as directed SQ 6 times per day    ---    * BD Pen Needle Short U/F 31G X 8 MM Miscellaneous as directed SQ 4 times a day    ---    * Humalog KwikPen 100 UNIT/ML Solution Pen-injector 3 units as directed (max 15 units daily) Subcutaneous with meals    ---    * Hydrochlorothiazide 25 mg Tablet 1 tablet in the morning Orally Once a day    ---    * Lantus 100 UNIT/ML Solution 42units (max of 45) Subcutaneous Once a day, Notes: increased to 48u today    ---    Not-Taking/PRN    * Aspirin Low Dose 81 MG Tablet Chewable 1 tablet Orally Once a day    ---    * Colace 100 MG Capsule 1 capsule as needed Orally Once a day    ---    * Iron (Ferrous Gluconate) 256 (28 Fe) MG Tablet 1 tablet Orally Once a day    ---    * Prenatal     ---    * Valtrex 500 mg Tablet 1 tablet Orally twice a day    ---    * Medication List reviewed and reconciled with the patient    ---      Past Medical History    ---       Diabetes .        ---    Chronic hypertension.        ---    Seizures as a child.        ---    Breast Feeding: yes.        ---      Surgical History    ---      lap cholecystectomy 2010    ---    cesarean delivery 2018    ---      Social History    ---    Tobacco history: Never smoked.    Marital Status  Single.    Work/Occupation: employed .    Alcohol  not currently.   10/22: plans to  stop work tomorrow, take some time  for herself before the baby comes. things ready at home.    11/12: feeling tired. good support at home. baby doing well - came home from  the nicu last week. no DV. not feeling depressed. EDS negative today.    ---      Gyn History    ---    Hx Abnormal pap smear yes, with biopsy  in past but no treatment. Last pap  2015..    Menstrual History LMP 12/05/2016.    STD HPV/condyloma, HSV/Herpes - outbreaks rare.    Pregnancy #1 Date- /2006, Del- TAB, Comments- No complications.      OB History    ---    Previous Pregnancies Total Pregnancies: 2, Full Term: 0, Premature: 0, AB.  Induced: 1, AB. Spontaneous: 0, Ectopics: 0, Multiple Births: 0, Living: 0\.      Allergies    ---      Penicillin: rash    ---    [Allergies Verified]      Hospitalization/Major Diagnostic Procedure    ---      childbirth 2018    ---      Review of Systems    ---    has stuffy nose and congestion - she thinks it's the start of a cold. no  fever/chills. no sob or chest pain or palpitations. eating well. no abdominal  pain. has vaginal bleeding now - she thinks it's her period. no more leg  swelling, no LE pain.        History of Present Illness    ---     _GENERAL_ :    here for postpartum BP follow up and postpartum physical exam with pap and  mirena placement.      Vital Signs    ---    Pain scale 0/10, Ht-in 66, Wt-lbs 241, BMI 38.89, BP 130/80.      Physical Examination    ---     _GENERAL_ :    General Appearance: Looks Healthy, well-nourished, well-groomed, no acute  distress, fatigued, pleasant. Mood/Affect: pleasant, happy, appropriate. Neck  no lymphadenopathy, no thyromegaly. Cardiovascular heart regular rate and  rhythm, extremities warm without swelling. Lungs Clear to auscultation  bilaterally. Abdomen Soft, nontender, nondistended, obese. Extremities No  edema. Female pelvic exam patient declined.          Assessments    ---    1\. Chronic hypertension affecting pregnancy - O10.919 (Primary)     ---    2\. Obesity affecting pregnancy, antepartum - O99.210    ---    3\. Pre-existing type 2 diabetes mellitus during pregnancy, antepartum -  O24.119    ---    4\. Encounter for other general counseling or advice on contraception - Z30.09    ---    5\. Encounter for postpartum visit - Z39.2    ---      Treatment    ---       **1\. Chronic hypertension affecting pregnancy**    Notes: started on HCTZ last visit. BP 130/80 today. feeling well. LE edema  resolved. labs last week wnl (aside from elevated blood glucose). continue  current dose of HCTZ. close follow up with primary care scheduled today.    ---        **2\. Obesity affecting pregnancy, antepartum**    Notes: tired postpartum as expected. admits not eating very well. empathy  given. pt understands importance of weight loss for better health.        **3\. Pre-existing type 2 diabetes mellitus during pregnancy, antepartum**    Notes: blood glucose 300 on recent labs - did not take insulin that day. did  not bring log today, but reports fasting today 125, pp sugars over last week  175, 190s, etc. random fingerstick today = 192. discussed importance of better  diabetes control. lantus 42 increased to Lantus 48 today. encouraged adherence  to  Lantus. for follow up with primary care.        **4\. Encounter for other general counseling or advice on contraception**    Notes: does not want pelvic exam today. discussed importance of cervical  cancer screening and discussed birth control today. does not want mirena  placement today, but still thinks this would be the best birth control for  her. offered interim method with progesterone only birth control pill.  declines for now. will consider pelvic exam with pap and [facility] since [facility] is closer to her home and she doesn't have to  pay for parking. offered follow up here with me if she would like - will  schedule appointment here in 4 weeks in case she decides she wants to  come  here, or has trouble getting appt closer to home. always glad to see Cobi  here.        **5\. Encounter for postpartum visit**    Notes: discussed importance of primary care follow up in the setting of HTN,  DM, and obesity. will make primary care appt asap. asked our front desk to  schedule this today so she has one on the books.      Follow Up    ---    4 Weeks    Electronically signed by Jerene Dilling Asencion Guisinger MD on 11/12/2017 at 12:28 PM EST    Sign off status: Completed        * * *        [facility]    [address]    [place], [place] [address]    Tel: [phone]    Fax: [phone]              * * *          Patient: [name], [name] DOB: Jul 19, 1976 Progress Note: Lashun Ramseyer, MD  11/12/2017    ---    Note generated by eClinicalWorks EMR/PM Software ([email or web address])

## 2017-11-12 NOTE — Progress Notes (Signed)
.  Progress Notes  .  Patient: [name], [name]  Provider: Vance Peper  MD  .  DOB: [birth year] Age: 42 Y Sex: Female  .  PCP: [name]   Date: 11/12/2017  .  --------------------------------------------------------------------------------  .  HISTORY OF PRESENT ILLNESS  .  GENERAL:   here for postpartum BP follow up and postpartum physical exam  with pap and mirena placement.  .  CURRENT MEDICATIONS  .  Taking Accu-Chek Aviva-Blood Glucose Test Strips 1 as directed SQ  8 times per day  Taking Accu-Chek Softclix Lancets 1 Miscellaneous as directed SQ  6 times per day  Taking BD Pen Needle Short U/F 31G X 8 MM Miscellaneous as  directed SQ 4 times a day  Taking Humalog KwikPen 100 UNIT/ML Solution Pen-injector 3 units  as directed (max 15 units daily) Subcutaneous with meals  Taking Hydrochlorothiazide 25 mg Tablet 1 tablet in the morning  Orally Once a day  Taking Lantus 100 UNIT/ML Solution 42units (max of 45)  Subcutaneous Once a day, Notes: increased to 48u today  Not-Taking/PRN Aspirin Low Dose 81 MG Tablet Chewable 1 tablet  Orally Once a day  Not-Taking/PRN Colace 100 MG Capsule 1 capsule as needed Orally  Once a day  Not-Taking/PRN Iron (Ferrous Gluconate) 256 (28 Fe) MG Tablet 1  tablet Orally Once a day  Not-Taking/PRN Prenatal  Not-Taking/PRN Valtrex 500 mg Tablet 1 tablet Orally twice a day  Medication List reviewed and reconciled with the patient  .  PAST MEDICAL HISTORY  .  Diabetes  Chronic hypertension  Seizures as a child  Breast Feeding: yes  .  ALLERGIES  .  Penicillin: rash  .  SURGICAL HISTORY  .  lap cholecystectomy 2010  cesarean delivery 2018  .  SOCIAL HISTORY  .  .  Tobaccohistory:Never smoked.  .  Marital Status Single.  .  Work/Occupation: employed .  [name]  Alcohol not currently.  .  10/22: plans to stop work tomorrow, take some time for herself  before the baby comes. things ready at home.11/12: feeling tired.  good support at home. baby doing well - came home from the nicu  last week.  no DV. not feeling depressed. EDS negative today.  .  GYN HISTORY  .  Hx Abnormal pap smear yes, with biopsy in past but no treatment.  Last pap 2015.  Menstrual History LMP 12/05/2016  STD HPV/condyloma, HSV/Herpes - outbreaks rare  Pregnancy #1 Date- /2006, Del- TAB, Comments- No complications  .  OB HISTORY  .  Previous Pregnancies Total Pregnancies: 2, Full Term: 0,  Premature: 0, AB. Induced: 1, AB. Spontaneous: 0, Ectopics: 0,  Multiple Births: 0, Living: 0  .  HOSPITALIZATION/MAJOR DIAGNOSTIC PROCEDURE  .  childbirth 2018  .  REVIEW OF SYSTEMS  .  has stuffy nose and congestion - she thinks it's the start of a  cold. no fever/chills. no sob or chest pain or palpitations.  eating well. no abdominal pain. has vaginal bleeding now - she  thinks it's her period. no more leg swelling, no LE pain.  [name]  VITAL SIGNS  .  Pain scale 0/10, Ht-in 66, Wt-lbs 241, BMI 38.89, BP 130/80.  [name]  PHYSICAL EXAMINATION  .  GENERAL:  General Appearance:  Looks Healthy, well-nourished, well-groomed,  no acute distress, fatigued, pleasant. Mood/Affect:  pleasant,  happy, appropriate. Neck  no lymphadenopathy, no thyromegaly.  Cardiovascular  heart regular rate and rhythm, extremities warm  without swelling. Lungs  Clear  to auscultation bilaterally.  Abdomen  Soft, nontender, nondistended, obese. Extremities  No  edema. Female pelvic exam  patient declined.  .  ASSESSMENTS  .  Chronic hypertension affecting pregnancy - O10.919 (Primary)  .  Obesity affecting pregnancy, antepartum - O99.210  .  Pre-existing type 2 diabetes mellitus during pregnancy,  antepartum - O24.119  .  Encounter for other general counseling or advice on contraception  - Z30.09  .  Encounter for postpartum visit - Z39.2  .  TREATMENT  .  Chronic hypertension affecting pregnancy  Notes: started on HCTZ last visit. BP 130/80 today. feeling well.  LE edema resolved. labs last week wnl (aside from elevated blood  glucose). continue current dose of HCTZ. close follow up  with  primary care scheduled today.  .  .  Obesity affecting pregnancy, antepartum  Notes: tired postpartum as expected. admits not eating very well.  empathy given. pt understands importance of weight loss for  better health.  .  .  Pre-existing type 2 diabetes mellitus during  pregnancy, antepartum  Notes: blood glucose 300 on recent labs - did not take insulin  that day. did not bring log today, but reports fasting today 125,  pp sugars over last week 175, 190s, etc. random fingerstick today  = 192. discussed importance of better diabetes control. lantus 42  increased to Lantus 48 today. encouraged adherence to Lantus. for  follow up with primary care.  .  .  Encounter for other general counseling or advice on  contraception  Notes: does not want pelvic exam today. discussed importance of  cervical cancer screening and discussed birth control today. does  not want mirena placement today, but still thinks this would be  the best birth control for her. offered interim method with  progesterone only birth control pill. declines for now. will  consider pelvic exam with pap and [facility] placement [facility] since it is closer to her home and she doesn't have  to pay for parking. offered follow up here with me if she would  like - will schedule appointment here in 4 weeks in case she  decides she wants to come here, or has trouble getting appt  closer to home. always glad to see Lilit here.  .  .  Encounter for postpartum visit  Notes: discussed importance of primary care follow up in the  setting of HTN, DM, and obesity. will make primary care appt  asap. asked our front desk to schedule this today so she has one  on the books.  .  FOLLOW UP  .  4 Weeks  .  Electronically signed by Jerene Dilling LIFF MD on  11/12/2017 at 12:28 PM EST  .  Document electronically signed by LIFF, INGRID  MD  .

## 2018-02-05 ENCOUNTER — Ambulatory Visit: Admitting: Internal Medicine

## 2018-02-05 ENCOUNTER — Ambulatory Visit

## 2018-02-05 ENCOUNTER — Ambulatory Visit: Admit: 2018-02-05 | Payer: HMO

## 2018-02-05 LAB — HX POINT OF CARE: HX HGB A1C, POC: 10.3 % — ABNORMAL HIGH

## 2018-02-05 MED ORDER — INSULIN LISPRO: 1 | 3 | 4 refills | 0 days | Status: AC

## 2018-02-05 MED ORDER — ATORVASTATIN CALCIUM: 1 | 90 | 4 refills | 0 days | Status: AC

## 2018-02-05 MED ORDER — NIFEDIPINE: 1 | 90 | 3 refills | 0 days | Status: DC

## 2018-02-05 MED ORDER — HYDROCHLOROTHIAZIDE: 1 | 90 | 4 refills | 0 days | Status: AC

## 2018-02-05 NOTE — Progress Notes (Signed)
General Medicine Visit    Service Due by Standard Protocol Rules: LDL, HGBA1C or HGBA1C%POC.      Initial Screening   * [place]: Perito (Tina)  Ht: 64.5 in.  Wt: 243 lbs.   BMI: 41.22  with shoes  Temp: 99 deg F.     BP (Initial [place] Screening): 158 / 80      BP (Rechecked, Actionable): 155 / 80 mmHg   HR: 73    O2 Sat: 100 %  Travel outside of the [place] in past 28 days:: No    Med List: PRINTED by Amasa for patient   Chronic Pain Assessment: Does pt experience chronic pain ? NO  Smoking Assessment: Tobacco use? never smoker        Falls Risk Assessment:   In the past year, have you ... Had no falls  Difficulty with balance? NO  Need assistance with ambulation while here? NO    Behavioral Health Tools:   PHQ2 Screening Score 0    PHQ9 Score 0  ......................................Marland KitchenCyndia Skeeters  February 05, 2018 3:13 PM    Chief Complaint:   42 year old here for f/u MMP     History of Present Illness:   DM   stopped humalog recently   off track with her new baby. things going well . she is 65 months old and healthy but life is busy. not checking sugars lately.   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  Health Care Proxy: Mom  Tobacco:none  Alcohol: 3/week; no alcohol now   Drugs:MJ, 4 days/week; 3 joints in a day; less now 2/week    Diet:diabetic "I need to lose weight"; 24 hour recall; AM sausage, eggs, one piece toast; water; no lunch yet; dinner last night-turkey sub, sometimes chips at work; sweets-few days/week  Exercise: could do more; now and  then  Car safety-seatbelts/texting:discussed    Partner: has  boyfriend now; using condoms 90%  DV:no  STI history:    gc, chlamydia, hpv, herpes                    HIV testing:2016 tested  Contraception: condoms, gyn at [facility] upcoming to discuss reinsert iud   Abnormal pap history: 9/10 ascus; colpo neg; 12/11 colpo lsil; 6/12 ascus, hpv not done; 4/13 pap neg, hr hpv pos, colpo neg; 5/14 pap/hpv neg;  pap May 2015-neg pap, did not receive hpv results    Last CE: 5/15; 1/17; 4/18  Pap: 5/15 pap neg (HPV sent but I didn't get result); pt prefers gyn  Mammo:ni; 8/17 on mammo van  Colo:ni  BMD:ni  Eye care:10/15;9/16, wants 6 month f/u   Dental: utd  Choleseterol:11/15 LDL over 100, rec'd statin  Risk Score Cholesterol: on high intensity statin  Tdap: 2012  MMR: vax 1978, 1994  Varicella:kid  Hep B:3 vax  Pvax:2011  Flu 2015, 2016  Zoster:ni  HPV:ni        weight is same as 6 week post partum visit  no shortness of breath  denies depression       Past Medical History (prior to today's visit):  AMENORRHEA (ICD-626.0) (ICD10-N91.2)  HIDRADENITIS (ICD-705.83) (ICD10-L73.2)  DIABETES MELLITUS, TYPE II, 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) (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)  HYPERTENSION (ICD-401.9) (ICD10-I10)  SHORTNESS OF BREATH (ICD-786.05) (ICD10-R06.02)  ANNUAL EXAM (ICD-V72.31) (ICD10-Z00.00)  DEPRESSION (ICD-311) (ICD10-F32.9)      ANXIETY (ICD-300.00) (ICD10-F41.9)  ABNORMAL PAP SMEAR 12/11 LSIL; COLPO 12/11; BX CIN I; 6/12 ASCUS PAP; 4/13 PAP NEG; HR HPV POS, NEG COLPO; 5/14 PAP/HR HPV NEG (ICD-795.00) (ICD10-R87.619)  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)  PATELLAR DISLOCATION-MEDIAL RETINACULAR TEAR; RIGHT KNEE (ICD-836.3) (ICD10-S83.006)  OBESITY (ICD-278.00) (ICD10-E66.9)  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)  SEXUALLY TRANSMITTED DISEASE, EXPOSURE TO-H/O GC, TRICH, HPV, CHLAMYDIA (ICD-V01.6) (ICD10-Z20.2)  S/P CHOLECYSTECTOMY (ICD-V45.79) (ICD10-Z90.49)    Past Medical History (changes today):  Added new problem of  ANEMIA (ICD-285.9) (ICD10-D64.9)         Medications (prior to today's visit):  LANTUS 100 UNIT/ML SUBCUTANEOUS SOLUTION (INSULIN GLARGINE) 35 units  udner skin daily  HUMALOG KWIKPEN 100 UNIT/ML SUBCUTANEOUS SOLUTION PEN-INJECTOR (INSULIN LISPRO) Use 3 units before each meal; Route: SUBCUTANEOUS  GNP PRENATAL VITAMINS 28-0.8 MG ORAL TABLET (PRENATAL VIT-FE FUMARATE-FA) take one by mouth daily; Route: ORAL  NIFEDIPINE ER 60 MG ORAL TABLET EXTENDED RELEASE 24 HOUR (NIFEDIPINE) Take one tablet by mouth once daily; Route: ORAL      * ACCUCHECK GLUCOMETER check sugars three times daily Dx: Diabetes type II with retinopathy E11.319      RELION ALL-IN-ONE DEVICE (BLOOD GLUC METER DISP-STRIPS) Use three times daily (whichever brand covered/with meter)      LANCETS (LANCETS) Use as directed.      INSULIN SYRINGE 30G X 1/2" 0.5 ML (INSULIN SYRINGE-NEEDLE U-100) use to inject insulin daily under skin      BD DISP NEEDLES 30G X 1/2" (NEEDLE (DISP)) use small needle to inject insulin      ACCU-CHEK AVIVA IN VITRO STRIP (GLUCOSE BLOOD) use 1-3 times a day   dx E11 .319; Route: IN VITRO    Medications (after today's visit):  LANTUS 100 UNIT/ML SUBCUTANEOUS SOLUTION (INSULIN GLARGINE) 42 units  sq daily  HUMALOG KWIKPEN 100 UNIT/ML SUBCUTANEOUS SOLUTION PEN-INJECTOR (INSULIN LISPRO) Use 3 units before each meal; Route: SUBCUTANEOUS  HYDROCHLOROTHIAZIDE 25 MG ORAL TABLET (HYDROCHLOROTHIAZIDE) Take one tablet by mouth once daily; Route: ORAL  ATORVASTATIN CALCIUM 40 MG ORAL TABLET (ATORVASTATIN CALCIUM) Take one tablet by mouth once daily; Route: ORAL      * ACCUCHECK GLUCOMETER check sugars three times daily Dx: Diabetes type II with retinopathy E11.319      RELION ALL-IN-ONE DEVICE (BLOOD GLUC METER DISP-STRIPS) Use three times daily (whichever brand covered/with meter)      LANCETS (LANCETS) Use as directed.      INSULIN SYRINGE 30G X 1/2" 0.5 ML (INSULIN SYRINGE-NEEDLE U-100) use to inject insulin daily under skin      BD DISP  NEEDLES 30G X 1/2" (NEEDLE (DISP)) use small needle to inject insulin      ACCU-CHEK AVIVA IN VITRO STRIP (GLUCOSE BLOOD) use 1-3 times a day   dx E11 .319; Route: IN VITRO       Medications Reviewed:  Done      Allergies (prior to today's visit):  PENICILLIN V POTASSIUM (PENICILLIN  V POTASSIUM) (Critical)                 Vitals:   Ht: 64.5 in.  Wt: 243 lbs.  BMI (in-lb) 41.22  Temp: 99deg F.     BP (Initial [place] Screening): 158 / 80     BP (Rechecked, Actionable): 155 / 80 mmHg   Pulse Rate: 73 bpm O2 Sat: 100 %      Additional PE:   General: Well appearing  Head: NCAT  Gait: normal  Eyes-anicteric  Neck: supple  Psych: affect and mood appropriate        Assessment & Plan:   43 year old diabetic, htn and depression here for f/u MMP       DM;  a1c today is 10.3 (was really well controlled in pregnancy, now needs to curb eating);  add humalog back (3-6 units depending on meal)            eye care pending for May            was on  atorvastatin 40 mg (LDL 117) restart           10/18 microalb neg               HTN  -off hctz 25 will restart; prior lisinopril/hctz but no microalbuminuria and no birth control right now; will keep on hydrochlorothiazide 25 mg    post partum hemorrhage, post hct 28; not on iron now. prefers no labs today; to do hct and iron studies next time. 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 Monitoring with eye care, pending for May appt       4. 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  Over the last 2 weeks how often have you been bothered by any of the following problems?            Not at All (0) -  Several Days (1) -  More Than Half the Days (2) -  Nearly Every Day (3)  a. Little interest or pleasure in doing things..... 0  b. Feeling down, depressed, or hopeless.....  0  c. Trouble falling/staying asleep, sleeping too much..... 0  d. Feeling tired or having little energy..... 0  e. Poor appetite or overeating..... 0  f. Feeling bad about yourself - or that you are a failure or have let yourself or your family down..... 0  g. Trouble concentrating on things such as reading the newspaper or watching television..... 0  h. Moving or speaking so slowly that other people could have noticed.   Or the opposite - being so fidgety  or restless that you have been moving around a lot more than usual..... 0  i. Thoughts that you would be better off dead or of hurting yourself in some way..... 0    PHQ-9 Screening: 0 scored  ......................................Marland KitchenCyndia Skeeters  February 05, 2018 3:13 PM          Created By Cyndia Skeeters on 02/05/2018 at 03:05 PM    Electronically Signed By [name] MD on 02/05/2018 at 03:44 PM

## 2018-02-05 NOTE — Progress Notes (Signed)
General Medicine Visit  .  Service Due by Standard Protocol Rules: LDL, HGBA1C orHGBA1C%POC.    .  Initial Screening   * [place]: [place] ([place])  Ht: 64.5 in.  Wt: 243 lbs.   BMI: 41.22  with shoes  Temp: 99 deg F.     BP (Initial Gross Screening): 158 / 80      BP (Rechecked, Actionable): 155 /   80 mmHg   HR: 73    O2 Sat: 100 %  Travel outside of the [place] in past 28 days:: No  .  Med List: PRINTED by [facility] for patient   Chronic Pain Assessment: Does pt experience chronic pain ? NO  Smoking Assessment: Tobacco use? never smoker  .  .  .  Falls Risk Assessment:   In the past year, have you ... Had no falls  Difficulty with balance? NO  Need assistance with ambulation while here? NO  .  Behavioral Health Tools:   PHQ2 Screening Score 0  .  PHQ9 Score 0  ......................................Marland KitchenCyndia Skeeters  February 05, 2018 3:13 PM  .  Chief Complaint:   42 year old here for f/u MMP   .  History of Present Illness:   DM   stopped humalog recently   off track with her new baby. things going well . she is 7 months old and h  ealthy but life is busy. not checking sugars lately.   has gained 24 lbs   during the pregnancy had done really really well   eye care scheduled for May 16    a1c is 10.3 today  .  HTN: was to be on hctz 25 mg but ran out a week ago, needs rx   .  post partum hemorrhage, had transfusion; d/c hct is 28; not currently on ir  on   .  .  .  .  .  .  .  Social History:   [place]; dad lived with them off and on; lived with mom, br  other;there was a time dad was cheating;   .  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  Diet:diabetic "I need to lose weight"; 24 hour recall; AM sausage, eggs, on  e piece toast; water; no lunch yet; dinner last night-turkey sub, sometimes   chips at work; sweets-few  days/week  Exercise: could do more; now and then  Car safety-seatbelts/texting:discussed  .  Partner: has  boyfriend now; using condoms 90%  DV:no  STI history:    gc, chlamydia, hpv, herpes                    HIV testing:2016 tested  Contraception: condoms, gyn at [facility] upcoming to discuss reinsert iud   Abnormal pap history: 9/10 ascus; colpo neg; 12/11 colpo lsil; 6/12 ascus,   hpv not done; 4/13 pap neg, hr hpv pos, colpo neg; 5/14 pap/hpv neg;  pap M  ay 2015-neg pap, did not receive hpv results  .  Last CE: 5/15; 1/17; 4/18  Pap: 5/15 pap neg (HPV sent but I didn't get result); pt prefers gyn  Mammo:ni; 8/17 on mammo van  Colo:ni  BMD:ni  Eye care:10/15;9/16, wants 6 month f/u   Dental: utd  Choleseterol:11/15 LDL  over 100, rec'd statin  Risk Score Cholesterol: on high intensity statin  Tdap: 2012  MMR: vax 1978, 1994  Varicella:kid  Hep B:3 vax  Pvax:2011  Flu 2015, 2016  Zoster:ni  HPV:ni  .  .  .  weight is same as 6 week post partum visit  no shortness of breath  denies depression   .  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  Past Medical History (changes today):  Added new problem of ANEMIA (ICD-285.9) (ICD10-D64.9)  .  [name]  Medications (prior to today's visit):  LANTUS 100 UNIT/ML SUBCUTANEOUS SOLUTION (INSULIN GLARGINE) 35 units  udner   skin daily  HUMALOG KWIKPEN 100 UNIT/ML SUBCUTANEOUS SOLUTION PEN-INJECTOR (INSULIN LIS  PRO) Use 3 units before each meal; Route: SUBCUTANEOUS  GNP PRENATAL VITAMINS 28-0.8 MG ORAL TABLET (PRENATAL VIT-FE FUMARATE-FA) t  ake one by mouth daily; Route: ORAL  NIFEDIPINE ER 60 MG ORAL TABLET EXTENDED RELEASE 24 HOUR (NIFEDIPINE) Take   one tablet by mouth once daily; Route: ORAL      * ACCUCHECK GLUCOMETER check sugars three times daily Dx: Diabetes type   II with retinopathy E11.319      RELION ALL-IN-ONE DEVICE (BLOOD GLUC METER DISP-STRIPS) Use three times   daily (whichever brand covered/with meter)      LANCETS (LANCETS) Use as directed.      INSULIN SYRINGE 30G X 1/2" 0.5 ML (INSULIN SYRINGE-NEEDLE U-100) use to   inject insulin daily under skin      BD DISP NEEDLES 30G X 1/2" (NEEDLE (DISP)) use small needle to inject i  nsulin      ACCU-CHEK AVIVA IN VITRO STRIP (GLUCOSE BLOOD) use 1-3 times a day   dx   E11 .319; Route: IN VITRO  .  Medications (after today's visit):  LANTUS 100 UNIT/ML SUBCUTANEOUS SOLUTION (INSULIN GLARGINE) 42 units  sq da  ily  HUMALOG KWIKPEN 100 UNIT/ML SUBCUTANEOUS SOLUTION PEN-INJECTOR (INSULIN LIS  PRO) Use 3 units before each meal; Route: SUBCUTANEOUS  HYDROCHLOROTHIAZIDE 25 MG ORAL TABLET (HYDROCHLOROTHIAZIDE) Take one tablet   by mouth once daily; Route: ORAL  ATORVASTATIN CALCIUM 40 MG ORAL TABLET (ATORVASTATIN CALCIUM) Take one tabl  et by mouth once daily; Route: ORAL      * ACCUCHECK GLUCOMETER check sugars three times daily Dx: Diabetes type   II with retinopathy E11.319      RELION ALL-IN-ONE DEVICE (BLOOD GLUC METER DISP-STRIPS) Use three times   daily (whichever brand covered/with meter)      LANCETS (LANCETS) Use as directed.      INSULIN  SYRINGE 30G X 1/2" 0.5 ML (INSULIN SYRINGE-NEEDLE U-100) use to   inject insulin daily under skin      BD DISP NEEDLES 30G X 1/2" (NEEDLE (DISP)) use small needle  to inject i  nsulin      ACCU-CHEK AVIVA IN VITRO STRIP (GLUCOSE BLOOD) use 1-3 times a day   dx   E11 .319; Route: IN VITRO  .  Medications Reviewed:  Done  .  [name]  Allergies (prior to today's visit):  PENICILLIN V POTASSIUM (PENICILLIN V POTASSIUM) (Critical)  .  .  .  .  .  .  Vitals:   Ht: 64.5 in.  Wt: 243 lbs.  BMI (in-lb) 41.22  Temp: 99deg F.     BP (Initial [place] Screening): 158 / 80     BP (Rechecked, Actionable): 155 / 8  0 mmHg   Pulse Rate: 73 bpm O2 Sat: 100 %  .  [name]  Additional PE:   General: Well appearing  Head: NCAT  Gait: normal  Eyes-anicteric  Neck: supple  Psych: affect and mood appropriate  .  .  .  Assessment /T/ Plan:   42 year old diabetic, htn and depression here for f/u MMP   .  .  DM;  a1c today is 10.3 (was really well controlled in pregnancy, now needs   to curb eating);  add humalog back (3-6 units depending on meal)            eye care pending for May            was on  atorvastatin 40 mg (LDL 117) restart           10/18 microalb neg   HTN  -off hctz 25 will restart; prior lisinopril/hctz but no microalbuminur  ia and no birth control right now; will keep on hydrochlorothiazide 25 mg  .  post partum hemorrhage, post hct 28; not on iron now. prefers no labs today  ; to do hct and iron studies next time. advised pt to take iron   .  .  anemia-had post partum hemorrhage; s/p transfusion; no sxs; advised iron an  d check cbc, iron next bloods/next month   .  .  .  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 Monitoring wit  h eye care, pending for May appt   .  .  4. depression/reactivity/irritability  -stopped sertraline, currently impro  ved   .  .  .  .  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  [name]  Orders (this visit):  Request for Medical Records [MSC-00000]  RTC in 4 weeks [RTC-028]  RTC in 3 months [RTC-090]  Est Level 4 [CPT-99214]  .  Patient Care Plan  .  [name]  Immunization Worksheet 2016   .  Patient Health Questionnaire (PHQ-9)  1.  Over the last 2 weeks how often have you been bothered by any of the fo  llowing problems?            Not at All (0) -  Several Days (1) -  More Than Half the Days (2)   -  Nearly Every Day (3)  a. Little interest or pleasure in doing things..... 0  b. Feeling down, depressed, or hopeless.....  0  c. Trouble falling/staying asleep, sleeping too much..... 0  d. Feeling tired or having little energy..... 0  e. Poor appetite or overeating...Marland KitchenMarland Kitchen 0  f. Feeling bad about yourself - or that you are a failure or have let yours  elf or your family down..... 0  g. Trouble concentrating on things such as reading the newspaper or watchin  g television..... 0  h. Moving or speaking so slowly that other people could have noticed.   Or   the opposite - being so fidgety or restless that you have been moving aroun  d a lot more than usual..... 0  i. Thoughts that you would be better off dead or of hurting yourself in som  e way..... 0  .  PHQ-9 Screening: 0 scored  ......................................Marland KitchenCyndia Skeeters  February 05, 2018 3:13 PM  .  .  .  .  Electronically Signed by [name], MD on 02/05/2018 at 3:44 PM  ________________________________________________________________________

## 2018-03-19 ENCOUNTER — Ambulatory Visit

## 2018-03-25 ENCOUNTER — Ambulatory Visit

## 2018-03-25 NOTE — Progress Notes (Signed)
[facility] Mar 25, 2018  [address]   [place]  [place] [address]  Main: [phone]  Fax: [phone]  Patient Portal: [name]       MR#: [id number]  [address]  [place], [place]  [address]       DOB: 1976/08/12      Dear  [name],      We are sorry to see that you were unable to keep your recent appointment at [facility] Your health is important to [place]. Please call the office as soon as it is convenient so that our staff may reschedule your appointment.    We now have a Patient Portal [[email or web address] so you can review your medical records, refill prescriptions and request appointments.       Sincerely,     Your Care Team at [facility]  [phone]    ** Please be aware of our new extended hours to better care for you. Hours are: Monday through Thursday from 8 am to 8 pm; Friday from 8 am to 5 pm; Saturday from 8 am to 12 noon.    We do appreciate at least 24 hours notice to cancel an appointment. If this letter is in error, we do apologize.        Created By Ann Held on 03/25/2018 at 08:56 AM    Electronically Signed By Ann Held on 03/25/2018 at 08:56 AM

## 2018-04-02 ENCOUNTER — Ambulatory Visit

## 2018-04-02 NOTE — Telephone Encounter (Signed)
----   Converted from flag ----  ---- 03/30/2018 7:26 AM, [name], MD wrote:  that would be great!     ---- 03/21/2018 2:46 PM, Alesia Banda PA wrote:  Hi Dr. [name]. Ms Methvin did not show to evening clinic on Tuesday,which was a rescheduled apt from the week prior. Would you like me to have Shawntae reach out to her to reschedule another apt before your F/U apt with her at the end of July? Thanks!  ------------------------------  RESPONSE/ORDERS:      Hi Shawntae. See above - could you please reach out to pt to schedule a F/U for diabetes management? Thanks!  ......................................Marland KitchenAlesia Banda PA  Apr 02, 2018 11:59 AM    Is this with [name] or someone else?  ......................................Marland KitchenSimone Curia  Apr 04, 2018 12:04 PM    Myself or [name] is fine. I know she has trouble coming in during normal hours, so I can see her in Tuesday evening clinic if its better for her. Thanks!  ......................................Marland KitchenAlesia Banda PA  Apr 04, 2018 12:05 PM    Went straight to voicemail. Called and left message.  ......................................Marland KitchenSimone Curia  Apr 04, 2018 12:07 PM    Thanks Irving Burton!   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) (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)  HYPERTENSION (ICD-401.9) (ICD10-I10)  SHORTNESS OF BREATH (ICD-786.05) (ICD10-R06.02)  ANNUAL EXAM (ICD-V72.31) (ICD10-Z00.00)  DEPRESSION (ICD-311) (ICD10-F32.9)      ANXIETY (ICD-300.00) (ICD10-F41.9)  ABNORMAL PAP SMEAR 12/11 LSIL; COLPO 12/11; BX CIN I; 6/12 ASCUS PAP; 4/13 PAP NEG; HR HPV POS, NEG COLPO; 5/14 PAP/HR HPV NEG (ICD-795.00) (ICD10-R87.619)  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)  PATELLAR DISLOCATION-MEDIAL RETINACULAR TEAR; RIGHT  KNEE (ICD-836.3) (ICD10-S83.006)  OBESITY (ICD-278.00) (ICD10-E66.9)  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)  SEXUALLY TRANSMITTED DISEASE, EXPOSURE TO-H/O GC, TRICH, HPV, CHLAMYDIA (ICD-V01.6) (ICD10-Z20.2)  S/P CHOLECYSTECTOMY (ICD-V45.79) (ICD10-Z90.49)    Allergies:   PENICILLIN V POTASSIUM (PENICILLIN V POTASSIUM) (Critical)    Meds (prior to this call):   LANTUS 100 UNIT/ML SUBCUTANEOUS SOLUTION (INSULIN GLARGINE) 42 units  sq daily  HUMALOG KWIKPEN 100 UNIT/ML SUBCUTANEOUS SOLUTION PEN-INJECTOR (INSULIN LISPRO) Use 3 units before each meal; Route: SUBCUTANEOUS  HYDROCHLOROTHIAZIDE 25 MG ORAL TABLET (HYDROCHLOROTHIAZIDE) Take one tablet by mouth once daily; Route: ORAL  ATORVASTATIN CALCIUM 40 MG ORAL TABLET (ATORVASTATIN CALCIUM) Take one tablet by mouth once daily; Route: ORAL      * ACCUCHECK GLUCOMETER check sugars three times daily Dx: Diabetes type II with retinopathy E11.319      RELION ALL-IN-ONE DEVICE (BLOOD GLUC METER DISP-STRIPS) Use three times daily (whichever brand covered/with meter)      LANCETS (LANCETS) Use as directed.      INSULIN SYRINGE 30G X 1/2" 0.5 ML (INSULIN SYRINGE-NEEDLE U-100) use to inject insulin daily under skin      BD DISP NEEDLES 30G X 1/2" (NEEDLE (DISP)) use small needle to inject insulin      ACCU-CHEK AVIVA IN VITRO STRIP (GLUCOSE BLOOD) use 1-3 times a day   dx E11 .319; Route: IN VITRO            Created By Alesia Banda PA on 04/02/2018 at 11:59 AM    Electronically Signed By Alesia Banda PA on 04/04/2018 at 12:56 PM

## 2018-04-29 ENCOUNTER — Ambulatory Visit

## 2018-04-29 ENCOUNTER — Ambulatory Visit: Admitting: Internal Medicine

## 2018-04-29 ENCOUNTER — Ambulatory Visit: Admit: 2018-04-29 | Payer: HMO

## 2018-04-29 LAB — HX CHEM-PANELS
HX ANION GAP: 9 (ref 3–14)
HX BLOOD UREA NITROGEN: 8 mg/dL (ref 6–24)
HX CHLORIDE (CL): 100 meq/L (ref 98–110)
HX CO2: 24 meq/L (ref 20–30)
HX CREATININE (CR): 1 mg/dL (ref 0.57–1.30)
HX GFR, AFRICAN AMERICAN: 80 mL/min/{1.73_m2}
HX GFR, NON-AFRICAN AMERICAN: 69 mL/min/{1.73_m2}
HX POTASSIUM (K): 3.9 meq/L (ref 3.6–5.1)
HX SODIUM (NA): 133 meq/L — ABNORMAL LOW (ref 135–145)

## 2018-04-29 LAB — HX POINT OF CARE
HX GLUCOSE-POCT: 331 mg/dL — ABNORMAL HIGH (ref 70–139)
HX HGB A1C, POC: 11.8 % — ABNORMAL HIGH

## 2018-04-29 LAB — HX CHEM-LIPIDS
HX CHOL-HDL RATIO: 4.9
HX CHOLESTEROL: 178 mg/dL (ref 110–199)
HX HIGH DENSITY LIPOPROTEIN CHOL (HDL): 36 mg/dL (ref 35–75)
HX HOURS FAST: 12 h
HX LDL: 122 mg/dL (ref 0–129)
HX TRIGLYCERIDES: 102 mg/dL (ref 40–250)

## 2018-04-29 LAB — HX CHEM-OTHER
HX % IRON SATURATION: 10 % — ABNORMAL LOW (ref 15–40)
HX FERRITIN: 70 ng/mL (ref 10–240)
HX IRON: 43 ug/dL (ref 37–170)
HX TOTAL IRON BINDING CAPACITY: 423 ug/dL (ref 253–463)
HX TRANSFERRIN: 302 mg/dL (ref 181–331)

## 2018-04-29 LAB — HX CHOLESTEROL
HX CHOLESTEROL: 178 mg/dL (ref 110–199)
HX HIGH DENSITY LIPOPROTEIN CHOL (HDL): 36 mg/dL (ref 35–75)
HX LDL: 122 mg/dL (ref 0–129)
HX TRIGLYCERIDES: 102 mg/dL (ref 40–250)

## 2018-04-29 MED ORDER — IBUPROFEN: 1 | 30 | 1 refills | 0 days | Status: DC

## 2018-04-29 MED ORDER — DOXYCYCLINE HYCLATE: 1 | 14 | 0 refills | 0 days | Status: DC

## 2018-04-29 NOTE — Telephone Encounter (Signed)
---- Converted from flag ----  ---- 04/29/2018 11:33 AM, [name], MD wrote:  HI susan,This patient isn't refilling her meds iincluding insulin due to high copays. I've asked Renae Fickle for suggestions but she is a single mom of an infant, currenlty overwhelmed with her life. She is on a Forensic psychologist and a1c is 11. She works full time and plans to see Adelina Mings in evening clinic some, but currently waiting for her infant daughter to have surgery which is also stressful. Let me konw if you have any ideas.Marland KitchenMarland KitchenJul  ------------------------------  RESPONSE/ORDERS:    for the humalog:[email or web address]/[email or web address].[email or web address]    [employer]   How can this program help me?  [employer] provides free Lilly medications for patients who meet program eligibility requirements.    Do I qualify for [employer]?  To receive free Lilly medications you must meet all program eligibility requirements. Some of the eligibility requirements for [facility] are listed below.    I am a permanent, legal resident of the [place] or [place]   My healthcare provider prescribed a Lilly medication available through [employer], and I have no insurance, or I have Medicare Part D, or, in some circumstances, my insurance does not cover the Lilly medication.   I am not enrolled in [place], full Low Income [place] (LIS, "Extra Help") or Veterans ([place]) Benefits. (Humatrope patients may be eligible.)   My household income is under the Annual Income Limit.   If I am a Medicare Part D patient, I have spent $1,100 on prescription medications this calendar year. This does not apply to Forteo, Humatrope, Olumiant, and Taltz patients.  For additional eligibility criteria please refer to the [employer] application.    for the lantus:  [email or web address].[email or web address]/sign-[email or web address]/[email or web address]    She would have to determine her eligibility.  I suspect she would meet the criteria.  I will continue to look.  Most drug companies have options to  assist with medication costs.  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)  HYPERTENSION (ICD-401.9) (ICD10-I10)  SHORTNESS OF BREATH (ICD-786.05) (ICD10-R06.02)  ANNUAL EXAM (ICD-V72.31) (ICD10-Z00.00)  DEPRESSION (ICD-311) (ICD10-F32.9)      ANXIETY (ICD-300.00) (ICD10-F41.9)  ABNORMAL PAP SMEAR 12/11 LSIL; COLPO 12/11; BX CIN I; 6/12 ASCUS PAP; 4/13 PAP NEG; HR HPV POS, NEG COLPO; 5/14 PAP/HR HPV NEG (ICD-795.00) (ICD10-R87.619)  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)  PATELLAR DISLOCATION-MEDIAL RETINACULAR TEAR; RIGHT KNEE (ICD-836.3) (ICD10-S83.006)  OBESITY (ICD-278.00) (ICD10-E66.9)  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)  SEXUALLY TRANSMITTED DISEASE, EXPOSURE TO-H/O GC, TRICH, HPV, CHLAMYDIA (ICD-V01.6) (ICD10-Z20.2)  S/P CHOLECYSTECTOMY (ICD-V45.79) (ICD10-Z90.49)    Allergies:   PENICILLIN V POTASSIUM (PENICILLIN V POTASSIUM) (Critical)    Meds (prior to this call):   LANTUS 100 UNIT/ML SUBCUTANEOUS SOLUTION (INSULIN GLARGINE) 42 units  sq daily  HUMALOG KWIKPEN 100 UNIT/ML SUBCUTANEOUS SOLUTION PEN-INJECTOR (INSULIN LISPRO) Use 3 units before each meal; Route: SUBCUTANEOUS  HYDROCHLOROTHIAZIDE 25 MG ORAL TABLET (HYDROCHLOROTHIAZIDE) Take one tablet by mouth once daily; Route: ORAL  ATORVASTATIN CALCIUM 40 MG ORAL TABLET (ATORVASTATIN CALCIUM) Take one tablet by mouth once daily; Route: ORAL      * ACCUCHECK GLUCOMETER check sugars three times daily  Dx: Diabetes type II with retinopathy E11.319      RELION ALL-IN-ONE DEVICE (BLOOD GLUC METER DISP-STRIPS) Use three times daily (whichever brand covered/with meter)      LANCETS  (LANCETS) Use as directed.      INSULIN SYRINGE 30G X 1/2" 0.5 ML (INSULIN SYRINGE-NEEDLE U-100) use to inject insulin daily under skin      BD DISP NEEDLES 30G X 1/2" (NEEDLE (DISP)) use small needle to inject insulin      ACCU-CHEK AVIVA IN VITRO STRIP (GLUCOSE BLOOD) use 1-3 times a day   dx E11 .319; Route: IN VITRO  DOXYCYCLINE HYCLATE 100 MG ORAL CAPSULE (DOXYCYCLINE HYCLATE) Take one capsule by mouth twice daily; Route: ORAL  IBUPROFEN 800 MG ORAL TABLET (IBUPROFEN) one tablet orally three times daily with meals as needed; Route: ORAL            Created By Jeanann Lewandowsky RN on 04/29/2018 at 04:43 PM    Electronically Signed By [name] MD on 05/02/2018 at 02:29 PM

## 2018-04-29 NOTE — Progress Notes (Signed)
General Medicine Visit  .  Service Due by Standard Protocol Rules: LDL, HGBA1C orHGBA1C%POC.    .  Initial Screening   * [place]ightwad: Sheikh ([name])  Ht: 64.5 in.  Wt: 230.6 lbs.   BMI: 39.11  with shoes  Temp: 98.7 deg F.     BP (Initial [place] Screening): 155 / 92      BP (Rechecked, Actionable): 135 /   85 mmHg   HR: 95    O2 Sat: 100 %  Health Mgmt materials? Weight Education Handout for high BMI  .  Med List: PRINTED by [facility] for patient   Chronic Pain Assessment: Does pt experience chronic pain ? NO  Smoking Assessment: Tobacco use? never smoker  HCP materials? Printed  .  Self Mgmt materials provided? Printed handout: Healthcare Proxy  .  [name]  Falls Risk Assessment:   In the past year, have you ... Had no falls  Difficulty with balance? NO  Need assistance with ambulation while here? NO  .  Behavioral Health Tools:   .  PHQ9 Score 7  Comments: Medication List given.  HGBA1c- done in office  .  .  ......................................Marland KitchenNafisa Sheikh  April 29, 2018 8:16 AM  .  Chief Complaint:   42 year old here for urgent care left ear pain   .  History of Present Illness:   5 days eyes red and sore, stuffy nose, eyes itchy, cough-yellow  1 day of left ear pain  no fevers   child is not sick  .  DM: slacked on taking care of herself; a1c is 77   STarr is having surgery soon--needs to move her anus   just got back to work from jury duty; needs to be out for surgery   no vacation time left   feels overwhelmed    some emotional support from her mom    has a lot of financial bills and hasn't picked up her insulin recently//no  t taking consistently   .  HTN: took her meds today  .  Depression-phq 9 is 7 c/w prior; GAD is moderate today.   .  Anemia-overdue for hct/iron f/u post partum   .  .  .  .  .  .  .  Social History:   [place]; dad lived with them off and on; lived with mom, br  other;there was a time dad was cheating;   .  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  Diet:diabetic "I need to lose weight"; 24 hour recall; AM sausage, eggs, on  e piece toast; water; no lunch yet; dinner last night-turkey sub, sometimes   chips at work; sweets-few days/week  Exercise: could do more; now and then  Car safety-seatbelts/texting:discussed  .  Partner: has  boyfriend now; using condoms 90%  DV:no  STI history:    gc, chlamydia, hpv, herpes                    HIV testing:2016 tested  Contraception: condoms, gyn at [facility] upcoming to discuss reinsert iud   Abnormal pap history: 9/10 ascus; colpo neg; 12/11 colpo lsil; 6/12 ascus,   hpv not done; 4/13 pap neg, hr hpv pos, colpo neg; 5/14 pap/hpv  neg;  pap M  ay 2015-neg pap, did not receive hpv results  .  Last CE: 5/15; 1/17; 4/18  Pap: 5/15 pap neg (HPV sent but I didn't get result); pt prefers gyn  Mammo:ni; 8/17 on mammo van  Colo:ni  BMD:ni  Eye care:10/15;9/16, wants 6 month f/u   Dental: utd  Choleseterol:11/15 LDL over 100, rec'd statin  Risk Score Cholesterol: on high intensity statin  Tdap: 2012  MMR: vax 1978, 1994  Varicella:kid  Hep B:3 vax  Pvax:2011  Flu 2015, 2016  Zoster:ni  HPV:ni  .  .  .  has lost 13 lbs  no sore throat or issues swallowing   .  .  .  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)  HYPERTENSION (ICD-401.9) (ICD10-I10)  SHORTNESS OF BREATH (ICD-786.05) (ICD10-R06.02)  ANNUAL EXAM (ICD-V72.31) (ICD10-Z00.00)  DEPRESSION (ICD-311) (ICD10-F32.9)      ANXIETY (ICD-300.00) (ICD10-F41.9)  ABNORMAL PAP SMEAR 12/11 LSIL; COLPO 12/11; BX CIN I; 6/12 ASCUS PAP; 4/13   PAP NEG; HR HPV POS, NEG COLPO; 5/14 PAP/HR HPV NEG (ICD-795.00) (ICD10-R87  .619)  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  Past Medical History (changes today):  Added new problem of OTITIS MEDIA (ICD-382.9) (ICD10-H66.90)  .  [name]  Medications (prior to today's visit):  LANTUS 100 UNIT/ML SUBCUTANEOUS SOLUTION (INSULIN GLARGINE) 42 units  sq da  ily  HUMALOG KWIKPEN 100 UNIT/ML SUBCUTANEOUS SOLUTION PEN-INJECTOR (INSULIN LIS  PRO) Use 3 units before each meal; Route: SUBCUTANEOUS  HYDROCHLOROTHIAZIDE 25 MG ORAL TABLET (HYDROCHLOROTHIAZIDE) Take one tablet   by mouth once daily; Route: ORAL  ATORVASTATIN CALCIUM 40 MG ORAL TABLET (ATORVASTATIN CALCIUM) Take one tabl  et by mouth once daily; Route: ORAL      * ACCUCHECK GLUCOMETER check sugars three times daily Dx: Diabetes type   II with retinopathy E11.319      RELION ALL-IN-ONE DEVICE (BLOOD GLUC METER DISP-STRIPS) Use three times   daily (whichever brand covered/with meter)      LANCETS (LANCETS) Use as directed.      INSULIN SYRINGE 30G X 1/2" 0.5 ML (INSULIN SYRINGE-NEEDLE U-100) use to   inject insulin daily under skin      BD DISP NEEDLES 30G X 1/2" (NEEDLE (DISP)) use small needle to inject i  nsulin      ACCU-CHEK AVIVA IN VITRO STRIP (GLUCOSE BLOOD) use 1-3 times a day   dx   E11 .319; Route: IN VITRO  .  Medications (after today's visit):  LANTUS 100 UNIT/ML SUBCUTANEOUS SOLUTION (INSULIN GLARGINE) 42 units  sq da  ily  HUMALOG KWIKPEN 100 UNIT/ML SUBCUTANEOUS SOLUTION PEN-INJECTOR (INSULIN LIS  PRO) Use 3 units before each meal; Route: SUBCUTANEOUS  HYDROCHLOROTHIAZIDE 25 MG ORAL TABLET (HYDROCHLOROTHIAZIDE) Take one tablet   by mouth once daily; Route: ORAL  ATORVASTATIN CALCIUM 40 MG ORAL TABLET (ATORVASTATIN CALCIUM) Take  one tabl  et by mouth once daily; Route: ORAL      * ACCUCHECK GLUCOMETER check sugars three times daily Dx: Diabetes type   II with retinopathy E11.319      RELION ALL-IN-ONE DEVICE (BLOOD GLUC METER DISP-STRIPS) Use three times  daily (whichever brand covered/with meter)      LANCETS (LANCETS) Use as directed.      INSULIN SYRINGE 30G X 1/2" 0.5 ML (INSULIN SYRINGE-NEEDLE U-100) use to   inject insulin daily under skin      BD DISP NEEDLES 30G X 1/2" (NEEDLE (DISP)) use small needle to inject i  nsulin      ACCU-CHEK AVIVA IN VITRO STRIP (GLUCOSE BLOOD) use 1-3 times a day   dx   E11 .319; Route: IN VITRO  DOXYCYCLINE HYCLATE 100 MG ORAL CAPSULE (DOXYCYCLINE HYCLATE) Take one caps  ule by mouth twice daily; Route: ORAL  IBUPROFEN 800 MG ORAL TABLET (IBUPROFEN) one tablet orally three times dail  y with meals as needed; Route: ORAL  .  [name]  .  Allergies (prior to today's visit):  PENICILLIN V POTASSIUM (PENICILLIN V POTASSIUM) (Critical)  .  .  .  .  .  .  Vitals:   Ht: 64.5 in.  Wt: 230.6 lbs.  BMI (in-lb) 39.11  Temp: 98.7deg F.     BP (Initial [place] Screening): 155 / 92     BP (Rechecked, Actionable): 135 / 8  5 mmHg   Pulse Rate: 95 bpm O2 Sat: 100 %  .  [name]  Additional PE:   General: teary,  holding left head   Head: NCAT  Gait: normal  Eyes-anicteric  Neck: supple  Psych: overwhelmed  o/p clear  small cervical LAN  left TM-bulging, red; perhaps small hold ; no pinna pain, no mastoid pain;   rgith ear-wax  Chest CTA   eyes-slightly reddened b/l conjunctivea   .  .  .  Assessment /T/ Plan:   42 year old diabetic, htn and depression here for urgent care left ear pain  .  Left ear pain on Day 5 of likely viral URI: viral vs bacterial OM: given un  controlled sugars and pain will treat   .  .  DM;  a1c today is 30 (was really well controlled in pregnancy, now not taki  ng  meds due to financial constraits out of insulin for 1 week, will get re  fill today     on lantus 42 and  humalog k (3-6 units depending on meal)  /E/par   flag   to Renae Fickle and [facility] re: ideas of minimizing costs; to see SW re: any financ  ial help             eye care needs to reschedule at some point ; feels can't now            on  atorvastatin 40 mg            10/18 microalb neg   HTN  -off hctz 25 will restart; prior lisinopril/hctz but no microalbuminur  ia and no birth control right now; will keep on hydrochlorothiazide 25 mg  .  post partum hemorrhage, post hct 28; recheck today   .  .  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 Monitoring with   eye care,   .   depression/reactivity/irritability  -stopped sertraline, overwhelmed; decl  ines ssri/med; will meet iwth SW today; encouraged to use daughters provide  rs SW as well during surgery time   .  .  .  .  Likely PCOS (advised weight loss)  Likely hidradenitis-no active boils now; use antibacterial and soaks; consi  der cleocin gel if continued  pvax in future   pap done at [facility]  2019--will get results; also to do mammo at [facility]; gett  ing IUD with gyn at [facility]   .  [name]  Orders (this visit):  LYTES [CPT-82495]  BUN [CPT-84520]  Creatinine (CR) [CPT-82565]  Iron (FE) [CPT-83540]  Iron/Tibc [CPT-83550]  Ferritin  [CPT-82728]  Lipid Profile-Chol, HDL, Trig, LDL(calc) [CPT-80061]  Est Level 4 [CPT-99214]  .  GAD-7: Anxiety Screening Tool   Over the last 2 weeks how often has the patient been bothered by the follow  ing problems:  1. Feel nervous, anxious or on edge:    1  2. Not being able to stop or control worrying:  1  3. Worrying too much about different things:    2  4. Trouble relaxing:    1  5. Being so restless that it is hard to sit still:  1  6. Becoming easily annoyed or irritable:    3  7. Feeling afraid as if something awful might happen:   1  Score: 10     Interpretation:  Moderate  .  [name]  Patient Health Questionnaire (PHQ-9)  1.  Over the last 2 weeks how often have you been bothered by any of the fo  llowing problems?            Not at All (0) -  Several Days (1) -  More  Than Half the Days (2)   -  Nearly Every Day (3)  a. Little interest or pleasure in doing things...[name]. 1  b. Feeling down, depressed, or hopeless...[name].  1  c. Trouble falling/staying asleep, sleeping too much..... 1  d. Feeling tired or having little energy..... 2  e. Poor appetite or overeating.Marland KitchenMarland KitchenMarland Kitchen. 1  f. Feeling bad about yourself - or that you are a failure or have let yours  elf or your family down...[name]. 1  g. Trouble concentrating on things such as reading the newspaper or watchin  g television..... 0  h. Moving or speaking so slowly that other people could have noticed.   Or   the opposite - being so fidgety or restless that you have been moving aroun  d a lot more than usual..... 0  i. Thoughts that you would be better off dead or of hurting yourself in som  e way..... 0  Follow-Up PHQ-9: 7  .  PHQ-9 Screening: 7 scored  .  Patient Care Plan  .  [name]  Immunization Worksheet 2016   .  .  [name]  Electronically Signed by [name], MD on 04/29/2018 at 11:39 AM  ________________________________________________________________________

## 2018-05-07 ENCOUNTER — Ambulatory Visit

## 2018-05-07 NOTE — Progress Notes (Signed)
[facility]  [address]  [place] [address]  Main: [phone]  Fax: [phone]  Patient Portal: [email or web address]      May 13, 2018            MRN: [id number]            DOB: August 10, 1976   [name]   [address]   [place] [address]      The results of your recent tests are as follows:      Please take your medications (cholesterol and blood pressure and diabetes) as we discussed--let me know if you need more hellp as this is so important we work together on this!      Cholesterol Tests:      Total Cholesterol   178  Normal 110-199    04/29/2018    HDL (good cholesterol)  36  Normal >40     04/29/2018    Triglyceride    102  Normal 40-250    04/29/2018    LDL (bad cholesterol)   122  Normal <160    04/29/2018     Diabetes Tests:      Glucose Random:   331mg /dl  Normal 29-528    41/32/4401    HgbA1c:    11.8  Normal 4.3 - 5.6    04/29/2018    HgbA1c POC:    11.8 %  Normal 4.3 - 5.6    04/29/2018     Blood Count Tests:      Ferritin:    70  Normal 10-240 women, 22-277 men 04/29/2018    Iron:     43  Normal 37-170 women 49-181 men 04/29/2018    TIBC:     423  Normal 253-463    04/29/2018     Kidney Tests:      BUN:     8  Normal 6 - 24    04/29/2018    Creatinine:    1.00  Normal 0.57 - 1.30   04/29/2018    GFR (African-American):  80  Normal >= 60    04/29/2018     Electrolyte Tests:      Sodium:    133 MEQ/L  Normal 135-145   04/29/2018    Potassium:    3.9 MEQ/L  Normal 3.6-5.1   04/29/2018    Chloride:    100 MEQ/L  Normal 98-110   04/29/2018    Bicarbonate:    24 MEQ/L  Normal 20 - 30   04/29/2018    Anion Gap:    9   Normal 5 - 18    04/29/2018       Sincerely,       [name], MD   [facility]  [phone]          Created By [employer] [employer]ers on 05/07/2018 at 05:44 PM    Electronically Signed By [name] MD on 05/13/2018 at 11:08 AM

## 2018-05-15 ENCOUNTER — Ambulatory Visit

## 2018-05-15 NOTE — Telephone Encounter (Signed)
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)  HYPERTENSION (ICD-401.9) (ICD10-I10)  SHORTNESS OF BREATH (ICD-786.05) (ICD10-R06.02)  ANNUAL EXAM (ICD-V72.31) (ICD10-Z00.00)  DEPRESSION (ICD-311) (ICD10-F32.9)      ANXIETY (ICD-300.00) (ICD10-F41.9)  ABNORMAL PAP SMEAR 12/11 LSIL; COLPO 12/11; BX CIN I; 6/12 ASCUS PAP; 4/13 PAP NEG; HR HPV POS, NEG COLPO; 5/14 PAP/HR HPV NEG (ICD-795.00) (ICD10-R87.619)  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)  PATELLAR DISLOCATION-MEDIAL RETINACULAR TEAR; RIGHT KNEE (ICD-836.3) (ICD10-S83.006)  OBESITY (ICD-278.00) (ICD10-E66.9)  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)  SEXUALLY TRANSMITTED DISEASE, EXPOSURE TO-H/O GC, TRICH, HPV, CHLAMYDIA (ICD-V01.6) (ICD10-Z20.2)  S/P CHOLECYSTECTOMY (ICD-V45.79) (ICD10-Z90.49)    Allergies:   PENICILLIN V POTASSIUM (PENICILLIN V POTASSIUM) (Critical)    Meds (prior to this call):   LANTUS 100 UNIT/ML SUBCUTANEOUS SOLUTION (INSULIN GLARGINE) 42 units  sq daily  HUMALOG KWIKPEN 100 UNIT/ML SUBCUTANEOUS SOLUTION PEN-INJECTOR (INSULIN LISPRO) Use 3 units before each meal; Route: SUBCUTANEOUS  HYDROCHLOROTHIAZIDE 25 MG ORAL TABLET (HYDROCHLOROTHIAZIDE) Take one tablet by mouth once daily; Route: ORAL  ATORVASTATIN CALCIUM 40 MG ORAL TABLET (ATORVASTATIN CALCIUM) Take one tablet by mouth once daily; Route: ORAL      * ACCUCHECK GLUCOMETER check sugars three times daily Dx: Diabetes type II with retinopathy E11.319      RELION ALL-IN-ONE DEVICE (BLOOD GLUC METER DISP-STRIPS) Use three times daily (whichever brand covered/with meter)      LANCETS (LANCETS) Use as directed.       INSULIN SYRINGE 30G X 1/2" 0.5 ML (INSULIN SYRINGE-NEEDLE U-100) use to inject insulin daily under skin      BD DISP NEEDLES 30G X 1/2" (NEEDLE (DISP)) use small needle to inject insulin      ACCU-CHEK AVIVA IN VITRO STRIP (GLUCOSE BLOOD) use 1-3 times a day   dx E11 .319; Route: IN VITRO  DOXYCYCLINE HYCLATE 100 MG ORAL CAPSULE (DOXYCYCLINE HYCLATE) Take one capsule by mouth twice daily; Route: ORAL  IBUPROFEN 800 MG ORAL TABLET (IBUPROFEN) one tablet orally three times daily with meals as needed; Route: ORAL            Created By [name] MD on 05/15/2018 at 01:19 PM    Electronically Signed By [name] MD on 05/15/2018 at 01:19 PM

## 2018-05-16 ENCOUNTER — Ambulatory Visit

## 2018-05-25 ENCOUNTER — Ambulatory Visit

## 2018-05-25 NOTE — Telephone Encounter (Signed)
----   Converted from flag ----  ---- 05/16/2018 5:12 PM, Vaughan Basta, Pharm D wrote:  Rip [place]bour. We have called a few times and left a message once. Will let you know if any luck.     ---- 05/02/2018 5:28 PM, [name], MD wrote:  Yes, would you mind calling her and see if she needs help? Shes curently a bit overwhlemd.Tajah Noguchi    ---- 04/29/2018 11:09 AM, Vaughan Basta, Pharm D wrote:  [name],  Note the copayments cited below for basal insulin products under her plan (#15 refers to a box of 5 pens). These companies have discount cards that she could sign up for that would reduce the copayments further ($5/mo for Basaglar or Tresiba, $0/month for Lantus) as long as she has a Secondary school teacher and no Medicaid or Medicare.  They are easy to sign up for on the company websites or by calling them. Happy to help her with that if needed.  Thanks, Paul     ---- 04/29/2018 10:36 AM, Berdie Ogren wrote:  lantus #15/30 days $30  basaglar #15/30 days $25  tresiba #15/30 days $25    ---- 04/29/2018 9:33 AM, Vaughan Basta, Pharm D wrote:  This patient is having a difficult time paying for Lantus.  Can we check if Basaglar or Evaristo Bury would be any more affordable.  Thanks, Renae Fickle  ------------------------------    Created By [name] MD on 05/25/2018 at 07:08 AM    Electronically Signed By [name] MD on 05/25/2018 at 07:08 AM

## 2018-05-28 ENCOUNTER — Ambulatory Visit

## 2018-05-28 NOTE — Telephone Encounter (Signed)
Portal Medical Question, (GMA) [facility], [name], MD  Send a Message to My Provider/Team  Provider  [facility]) [facility] - [name], MD   Do not use this form for Emergencies. If you are having an emergency, call 911 now.     Subject:  Other   Details:  Hello,    Please cancel appointment for today with Dr.Kennen Stammer Tisher    Thank You!   Disclaimer     If this is a medical emergency please call 911. Messages submitted will be processed by our staff within 48 business hours.   If you need immediate assistance please call the practice.      Note: These are specially coded messages to be directed to your Care Team, and will not be visible in the Sent folder of your Inbox.           Created By  [employer] on 05/28/2018 at 11:18 AM    Electronically Signed By [name] MD on 05/28/2018 at 12:51 PM

## 2018-05-31 ENCOUNTER — Ambulatory Visit

## 2018-05-31 NOTE — Telephone Encounter (Signed)
Called and left message for pt to call and schedule PE appt for Sept.    ---- Converted from flag ----  ---- 05/30/2018 9:52 AM, [name], MD wrote:  please reach out to reschedule pt with me in September. thanks. 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) (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)  HYPERTENSION (ICD-401.9) (ICD10-I10)  SHORTNESS OF BREATH (ICD-786.05) (ICD10-R06.02)  ANNUAL EXAM (ICD-V72.31) (ICD10-Z00.00)  DEPRESSION (ICD-311) (ICD10-F32.9)      ANXIETY (ICD-300.00) (ICD10-F41.9)  ABNORMAL PAP SMEAR 12/11 LSIL; COLPO 12/11; BX CIN I; 6/12 ASCUS PAP; 4/13 PAP NEG; HR HPV POS, NEG COLPO; 5/14 PAP/HR HPV NEG (ICD-795.00) (ICD10-R87.619)  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)  PATELLAR DISLOCATION-MEDIAL RETINACULAR TEAR; RIGHT KNEE (ICD-836.3) (ICD10-S83.006)  OBESITY (ICD-278.00) (ICD10-E66.9)  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)  SEXUALLY TRANSMITTED DISEASE, EXPOSURE TO-H/O GC, TRICH, HPV, CHLAMYDIA (ICD-V01.6) (ICD10-Z20.2)  S/P CHOLECYSTECTOMY (ICD-V45.79) (ICD10-Z90.49)    Allergies:   PENICILLIN V POTASSIUM (PENICILLIN V POTASSIUM) (Critical)    Meds (prior to this call):   LANTUS 100 UNIT/ML SUBCUTANEOUS SOLUTION (INSULIN GLARGINE) 42 units  sq daily  HUMALOG KWIKPEN 100 UNIT/ML SUBCUTANEOUS SOLUTION PEN-INJECTOR (INSULIN LISPRO) Use 3 units before each meal; Route: SUBCUTANEOUS  HYDROCHLOROTHIAZIDE 25 MG ORAL TABLET (HYDROCHLOROTHIAZIDE) Take one tablet by mouth once daily; Route: ORAL  ATORVASTATIN CALCIUM 40 MG ORAL TABLET (ATORVASTATIN CALCIUM) Take one tablet by mouth once daily; Route: ORAL      * ACCUCHECK GLUCOMETER check sugars three times daily Dx: Diabetes type II with retinopathy E11.319      RELION ALL-IN-ONE  DEVICE (BLOOD GLUC METER DISP-STRIPS) Use three times daily (whichever brand covered/with meter)      LANCETS (LANCETS) Use as directed.      INSULIN SYRINGE 30G X 1/2" 0.5 ML (INSULIN SYRINGE-NEEDLE U-100) use to inject insulin daily under skin      BD DISP NEEDLES 30G X 1/2" (NEEDLE (DISP)) use small needle to inject insulin      ACCU-CHEK AVIVA IN VITRO STRIP (GLUCOSE BLOOD) use 1-3 times a day   dx E11 .319; Route: IN VITRO  DOXYCYCLINE HYCLATE 100 MG ORAL CAPSULE (DOXYCYCLINE HYCLATE) Take one capsule by mouth twice daily; Route: ORAL  IBUPROFEN 800 MG ORAL TABLET (IBUPROFEN) one tablet orally three times daily with meals as needed; Route: ORAL            Created By [employer]well Miners on 05/31/2018 at 11:15 AM    Electronically Signed By [name] MD on 05/31/2018 at 12:32 PM

## 2018-07-09 ENCOUNTER — Ambulatory Visit

## 2018-07-10 ENCOUNTER — Ambulatory Visit

## 2018-07-10 NOTE — Telephone Encounter (Signed)
Called pt to schedule appt to see Dr. [name] tomorrow. No answer. LMOM for pt to call back if she can take one of the available appts. 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)  HYPERTENSION (ICD-401.9) (ICD10-I10)  SHORTNESS OF BREATH (ICD-786.05) (ICD10-R06.02)  ANNUAL EXAM (ICD-V72.31) (ICD10-Z00.00)  DEPRESSION (ICD-311) (ICD10-F32.9)      ANXIETY (ICD-300.00) (ICD10-F41.9)  ABNORMAL PAP SMEAR 12/11 LSIL; COLPO 12/11; BX CIN I; 6/12 ASCUS PAP; 4/13 PAP NEG; HR HPV POS, NEG COLPO; 5/14 PAP/HR HPV NEG (ICD-795.00) (ICD10-R87.619)  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)  PATELLAR DISLOCATION-MEDIAL RETINACULAR TEAR; RIGHT KNEE (ICD-836.3) (ICD10-S83.006)  OBESITY (ICD-278.00) (ICD10-E66.9)  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)  SEXUALLY TRANSMITTED DISEASE, EXPOSURE TO-H/O GC, TRICH, HPV, CHLAMYDIA (ICD-V01.6) (ICD10-Z20.2)  S/P CHOLECYSTECTOMY (ICD-V45.79) (ICD10-Z90.49)    Allergies:   PENICILLIN V POTASSIUM (PENICILLIN V POTASSIUM) (Critical)    Meds (prior to this call):   LANTUS 100 UNIT/ML SUBCUTANEOUS SOLUTION (INSULIN GLARGINE) 42 units  sq daily  HUMALOG KWIKPEN 100 UNIT/ML SUBCUTANEOUS SOLUTION PEN-INJECTOR (INSULIN LISPRO) Use 3 units before each meal; Route: SUBCUTANEOUS  HYDROCHLOROTHIAZIDE 25 MG ORAL TABLET (HYDROCHLOROTHIAZIDE) Take one tablet by mouth once daily; Route: ORAL  ATORVASTATIN CALCIUM 40 MG ORAL TABLET (ATORVASTATIN CALCIUM) Take one tablet by mouth once daily; Route: ORAL      * ACCUCHECK GLUCOMETER check sugars three times daily Dx: Diabetes type II with retinopathy E11.319      RELION ALL-IN-ONE DEVICE (BLOOD GLUC METER  DISP-STRIPS) Use three times daily (whichever brand covered/with meter)      LANCETS (LANCETS) Use as directed.      INSULIN SYRINGE 30G X 1/2" 0.5 ML (INSULIN SYRINGE-NEEDLE U-100) use to inject insulin daily under skin      BD DISP NEEDLES 30G X 1/2" (NEEDLE (DISP)) use small needle to inject insulin      ACCU-CHEK AVIVA IN VITRO STRIP (GLUCOSE BLOOD) use 1-3 times a day   dx E11 .319; Route: IN VITRO  DOXYCYCLINE HYCLATE 100 MG ORAL CAPSULE (DOXYCYCLINE HYCLATE) Take one capsule by mouth twice daily; Route: ORAL  IBUPROFEN 800 MG ORAL TABLET (IBUPROFEN) one tablet orally three times daily with meals as needed; Route: ORAL            Created By [employer] on 07/10/2018 at 05:14 PM    Electronically Signed By [name] MD on 07/10/2018 at 06:00 PM

## 2018-07-11 ENCOUNTER — Ambulatory Visit

## 2018-10-31 ENCOUNTER — Ambulatory Visit

## 2018-10-31 ENCOUNTER — Ambulatory Visit: Admitting: Internal Medicine

## 2018-10-31 ENCOUNTER — Ambulatory Visit: Admit: 2018-10-31 | Payer: HMO

## 2018-10-31 LAB — HX CHOLESTEROL
HX CHOLESTEROL: 197 mg/dL (ref 110–199)
HX HIGH DENSITY LIPOPROTEIN CHOL (HDL): 51 mg/dL (ref 35–75)
HX LDL: 127 mg/dL (ref 0–129)
HX TRIGLYCERIDES: 97 mg/dL (ref 40–250)

## 2018-10-31 LAB — HX CHEM-PANELS
HX ANION GAP: 7 (ref 3–14)
HX BLOOD UREA NITROGEN: 14 mg/dL (ref 6–24)
HX CHLORIDE (CL): 99 meq/L (ref 98–110)
HX CO2: 26 meq/L (ref 20–30)
HX CREATININE (CR): 1.16 mg/dL (ref 0.57–1.30)
HX GFR, AFRICAN AMERICAN: 67 mL/min/{1.73_m2}
HX GFR, NON-AFRICAN AMERICAN: 58 mL/min/{1.73_m2} — ABNORMAL LOW
HX POTASSIUM (K): 4.3 meq/L (ref 3.6–5.1)
HX SODIUM (NA): 132 meq/L — ABNORMAL LOW (ref 135–145)

## 2018-10-31 LAB — HX HEM-ROUTINE
HX HCT: 38 % (ref 32.0–45.0)
HX HGB: 12.3 g/dL (ref 11.0–15.0)
HX MCH: 28.9 pg (ref 26.0–34.0)
HX MCHC: 32.4 g/dL (ref 32.0–36.0)
HX MCV: 89.4 fL (ref 80.0–98.0)
HX MPV: 9.4 fL (ref 9.1–11.7)
HX NRBC #: 0 10*3/uL
HX NUCLEATED RBC: 0 %
HX PLT: 350 10*3/uL (ref 150–400)
HX RBC BLOOD COUNT: 4.25 M/uL (ref 3.70–5.00)
HX RDW: 15.3 % — ABNORMAL HIGH (ref 11.5–14.5)
HX WBC: 7.8 10*3/uL (ref 4.0–11.0)

## 2018-10-31 LAB — HX CHEM-LIPIDS
HX CHOL-HDL RATIO: 3.9
HX CHOLESTEROL: 197 mg/dL (ref 110–199)
HX HIGH DENSITY LIPOPROTEIN CHOL (HDL): 51 mg/dL (ref 35–75)
HX HOURS FAST: 2 h
HX LDL: 127 mg/dL (ref 0–129)
HX TRIGLYCERIDES: 97 mg/dL (ref 40–250)

## 2018-10-31 LAB — HX BF-CHEM/URINE
HX ALBUMIN RANDOM URINE: 0.7 mg/dL
HX ALBUMIN/CREATININE RATIO, URINE: 8 mg/g (ref 0–30)
HX CREATININE, RANDOM URINE: 91.47 mg/dL
HX MICROALBUMIN CALC: 0.01 mg/mg

## 2018-10-31 LAB — HX DIABETES: HX ALBUMIN RANDOM URINE: 0.7 mg/dL

## 2018-10-31 LAB — HX POINT OF CARE: HX HGB A1C, POC: 13.3 % — ABNORMAL HIGH

## 2018-10-31 MED ORDER — DOXYCYCLINE HYCLATE: 1 | 14 | 0 refills | 0 days | Status: DC

## 2018-10-31 MED ORDER — INSULIN GLARGINE: 1 | 3 | 6 refills | 0 days | Status: AC

## 2018-10-31 MED ORDER — FLUOXETINE HCL: 1 | 30 | 1 refills | 0 days | Status: AC

## 2018-10-31 MED ORDER — HYDROCHLOROTHIAZIDE: 1 | 90 | 4 refills | 0 days | Status: AC

## 2018-10-31 MED ORDER — ATORVASTATIN CALCIUM: 1 | 90 | 4 refills | 0 days | Status: AC

## 2018-10-31 NOTE — Telephone Encounter (Signed)
----   Converted from flag ----  ---- 05/16/2018 5:12 PM, Vaughan Basta, Pharm D wrote:  Rip Harbour. We have called a few times and left a message once. Will let you know if any luck.     ---- 05/02/2018 5:28 PM, [name], MD wrote:  Yes, would you mind calling her and see if she needs help? Shes curently a bit overwhlemd.[name]    ---- 04/29/2018 11:09 AM, Vaughan Basta, Pharm D wrote:  [name],  Note the copayments cited below for basal insulin products under her plan (#15 refers to a box of 5 pens). These companies have discount cards that she could sign up for that would reduce the copayments further ($5/mo for Basaglar or Tresiba, $0/month for Lantus) as long as she has a Secondary school teacher and no Medicaid or Medicare.  They are easy to sign up for on the company websites or by calling them. Happy to help her with that if needed.  Thanks, Paul     ---- 04/29/2018 10:36 AM, Berdie Ogren wrote:  lantus #15/30 days $30  basaglar #15/30 days $25  tresiba #15/30 days $25    ---- 04/29/2018 9:33 AM, Vaughan Basta, Pharm D wrote:  This patient is having a difficult time paying for Lantus.  Can we check if Basaglar or Evaristo Bury would be any more affordable.  Thanks, Renae Fickle  ------------------------------    Created By [name] MD on 10/31/2018 at 08:35 AM    Electronically Signed By [name] MD on 10/31/2018 at 08:35 AM

## 2018-10-31 NOTE — Progress Notes (Signed)
Checkout  Return to Clinic 2 weeks  Appointment Made Yes  RTC Date: 11/12/2018  Time: 6:00PM          Created By Orbie Pyo on 10/31/2018 at 09:41 AM    Electronically Signed By Orbie Pyo on 10/31/2018 at 09:42 AM

## 2018-10-31 NOTE — Progress Notes (Signed)
[facility] October 31, 2018  [address]   [place]  [place] [address]  Main: [phone]  Fax: [phone]  Patient Portal: [email or web address]            Authorization to [employer] Information  Please complete this form to have your records sent to your primary care doctor to use for your care.    From: [facility], [place], [place]    To: [name], MD    Provider Phone #: [phone]   [facility], Primary Care - [facility] Provider Fax #: [phone]   [address]   [place], [place] [address]    Please indicate below the information that you authorize to be released to [facility].  Patient Information:  Name: [name] Date of Birth: 07-04-1976 [facility] Medical Record # [id number]  Address: [address]  [place], [place]  [address]  Area Code/Telephone #: [phone]  Alternate #: __________________    Purpose of the Requested Use or Disclosure:  Medical Treatment or Transfer: __x____     Legal: ______     Insurance: ______     Personal: ______  Other (please specify): ________________________     Continuity and coordination of care ______         Specific Description of Information (must include date(s)):  Date(s) of Treatment: _________________________________  ____ Complete Record ____ ER Record  ____ Discharge Summary  ____ Pathology Reports ____ Operative Report  ____ X-Ray/MRI/Cat Scan Reports  ____ Lab Reports  ____ Clinic Visit Note  ____ Therapy (Physical/Occupational)  ____ Immunizations  _x___ Other: (please specify) ________________________________  ____ Abstract of Record: (e.g. History & Physical, Operative & Discharge Reports, Consults, Lab Reports, ER Reports - specify elements to be released) ___________________________________    Release of Specifically Protected Health Information  If the information described above includes information in any category below, I specifically authorize the  use or disclosure of such information. Please indicate the specific information to be used or disclosed and sign where indicated:   ____ HIV/AIDS testing/test results (patient authorization required for each release request)    Specify date: __________________     _________________________________________  October 31, 2018     Signature of Patient/Legal Representative  Date     Relationship to Patient or Authority to Act on Patients' behalf: ______________   ____ Genetic testing/test results    Specify date: ____________________   Specify type of test: ________________________________________________________     _________________________________________  October 31, 2018     Signature of Patient/Legal Representative  Date     Relationship to Patient or Authority to Act on Patients' behalf: ______________    Information identified in any category below:  Alcohol and drug abuse records   Specify dates: ____________________  Mental health treatment/psychotherapy  Specify dates: ____________________  Sexual assault counseling   Specify dates: ____________________  Social service counseling/therapy  Specify dates: ____________________  Venereal diseases/sexually-transmitted diseases Specify dates: ____________________     _________________________________________   October 31, 2018   Signature of Patient/Legal Representative   Date   Relationship to Patient or Authority to Act on Patients' behalf: _____________________    To Whom Information Will Be Disclosed. I authorize the above mentioned institution/provider to disclose copies of my protected health information as described above to: [facility], Primary Care - [place], Attention: [name], MD; [address], [place], [place] [address]    Expiration. This authorization will expire automatically in 6  months or on the following date or event that relates to me or the purpose of the use or disclosure: _______________________________    Specific  Understandings  I understand that I may revoke this authorization by notifying the above mentioned institution/provider at any time in writing, but if I do it won't have any affect on actions taken by above mentioned institution/provider before they received the revocation.    I may refuse to sign this authorization. My health care, the payment for my health care, and my health care benefits will not be affected if I do not sign this form (except if health care services are provided to me solely for the purpose of creating protected health information for disclosure to a third party). I have a right to receive a copy of this form after I have signed it.    By signing this authorization form, I authorize the use or disclosure of my protected health information as described above. I understand that information used or disclosed pursuant to this authorization may be disclosed by the recipient and may no longer be protected by federal or state law.    I have read this form and all of my questions about this form have been answered. By signing below, I acknowledge that I have read and accept all of the above.    _________________________________________   October 31, 2018  Signature of Patient/Legal Representative     Date  Relationship to Patient or Authority to Act on Patient's Behalf: _____________________      Created By Orbie Pyo on 10/31/2018 at 09:40 AM    Electronically Signed By [name] MD on 10/31/2018 at 09:48 AM

## 2018-10-31 NOTE — Progress Notes (Signed)
General Medicine Visit  .  Service Due by Standard Protocol Rules: MICROALB/CRE, HGBA1C or HGBA1C%POC.  .  Initial Screening   * [place]: Leon (Christiane)  Ht: 64.5 in.  Wt: 217 lbs.   BMI: 36.81  Temp: 97.2 deg F.     BP (Initial [place] Screening): 142 / 87      BP (Rechecked, Actionable): 118 /   78 mmHg   HR: 100    O2 Sat: 99 %  Health Mgmt materials? Weight Education Handout for high BMI  .  Med List: PRINTED by Riley for patient   Chronic Pain Assessment: Does pt experience chronic pain ? NO  Have you received a Flu shot recently (where)? workplace  Smoking Assessment: Tobacco use? current every day smoker  Smoke type? cigarette  Smoking Counseling? YES [employer] form was provided today  .  .  .  Falls Risk Assessment:   In the past year, have you ... N/A (patient not ambulatory)  Difficulty with balance? NO  Need assistance with ambulation while here? NO  .  Behavioral Health Tools:   .  PHQ9 Score 7  Comments: Medication list given to the patient  .  ......................................Marland KitchenChristiane Leon  October 31, 2018 8  :17 AM  .  Chief Complaint:   42 year old here for annual  .  History of Present Illness:   calluses on feet  .  Current Problems:   .  ANEMIA   HIDRADENITIS (ICD-705.83) (ICD10-L73.2)  DIABETES MELLITUS, TYPE II, WITH RETINOPATHY has been using humalog only; r  an out of lantus; 8 units, twice/daily.  Was to be on 42 units of Lantus bu  t now only taking 16 U humalog /day or so; Hasn't been checking sugars for   a few weeks. a1c today is 13.3  HYPERTENSIONpt states has been adherent   SHORTNESS OF BREATH none currently   .  DEPRESSION       ANXIETY   ABNORMAL PAP SMEAR 12/11 LSIL; COLPO 12/11; BX CIN I; 6/12 ASCUS PAP; 4/13   PAP NEG; HR HPV POS, NEG COLPO; 5/14 PAP/HR HPV NEG   HORNER'S SYNDROME  PATELLAR DISLOCATION-MEDIAL RETINACULAR TEAR; RIGHT KNEE   OBESITY has lost 13 lbs since June  HSV-RARE BREAKOUTS   SEXUALLY TRANSMITTED DISEASE, EXPOSURE TO-H/O GC, TRICH, HPV, CHLAMYDIA   S/P  CHOLECYSTECTOMY   .  .  .  .  .  .  .  Family History: (reviewed)   father-deceased, dm, chf, prostate cancer,   mother-alive-dm,   brother-no med problems  no kids  .  Social History: (reviewed)   [place]; dad lived with them off and on; lived with mom, br  other;there was a time dad was cheating;   .  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  Diet:diabetic ; has lost 13 lbs; "could be a little better" still snacking/  /emotional eating  Exercise: could do more; now and then  Car safety-seatbelts/texting:discussed  .  Partner: FOB ; using condoms 90%//currently not SA  DV:no  STI history:    gc, chlamydia, hpv, herpes                    HIV testing:2016 tested  Contraception: condoms, gyn at [facility] upcoming to discuss reinsert iud   Abnormal pap history: 9/10 ascus; colpo neg; 12/11 colpo lsil; 6/12 ascus,   hpv not done; 4/13 pap neg, hr hpv pos, colpo neg; 5/14 pap/hpv neg;  pap M  ay 2015-neg pap, did not receive hpv results; thinks she had one 2019, will   try to get   .  Last CE: 5/15; 1/17; 4/18 ; 12/19  Pap: 5/15 pap neg (HPV sent but I didn't get result); 2019, need records fr  om [place]  Mammo:ni; 8/17 on mammo van; order  Colo:ni  BMD:ni  Eye care:due 4/20  Dental: utd  Choleseterol:11/15 LDL over 100, rec'd statin  Risk Score Cholesterol: on high intensity statin  Tdap: 2012  MMR: vax 1978, 1994  Varicella:kid  Hep B:3 vax  Pvax:2011  Flu 2015, 2016;2019  Zoster:ni  HPV:ni  .  .  .  ROS:  General: Denies fevers, chills, sweats, some  fatigue, weight loss,    Eyes: Denies vision loss.   Ears/Nose/Throat: Denies earache, tinnitus, sore throat, dysphagia.   Cardiovascular: Denies chest pains, palpitations, dyspnea on exertion, orth  opnea, PND, peripheral edema.   Respiratory:  Denies cough, dyspnea, hemoptysis, wheezing.   Gastrointestinal: Denies nausea, vomiting, diarrhea, constipation, change i  n bowel habits, abdominal pain, hematochezia, heartburn.  Gyn/GU-negative   Musculoskeletal: Denies back pain, joint pain, joint swelling.   Skin: Denies rash  Neurologic: Denies weakness or memory issues  Psychiatric: see HPI  Endocrine: Denies cold intolerance, heat intolerance some , polydipsia, pol  yphagia, some  polyuria.   Heme/Lymphatic: Denies abnormal bruising. Sleep: snores, wakes up at night  .  .  .  Past Medical History (prior to today's visit):  OTITIS MEDIA (ICD-382.9) (ICD10-H66.90)  DIABETES MELLITUS, TYPE II, 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) (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)  AMENORRHEA (ICD-626.0) (ICD10-N91.2)  HYPERTENSION (ICD-401.9) (ICD10-I10)  ANEMIA (ICD-285.9) (ICD10-D64.9)  HIDRADENITIS (ICD-705.83) (ICD10-L73.2)  SHORTNESS OF BREATH (ICD-786.05) (ICD10-R06.02)  ANNUAL EXAM (ICD-V72.31) (ICD10-Z00.00)  DEPRESSION (ICD-311) (ICD10-F32.9)      ANXIETY (ICD-300.00) (ICD10-F41.9)  ABNORMAL PAP SMEAR 12/11 LSIL; COLPO 12/11; BX CIN I; 6/12 ASCUS PAP; 4/13   PAP NEG; HR HPV POS, NEG COLPO; 5/14 PAP/HR HPV NEG (ICD-795.00) (ICD10-R87  .619)  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  Past Medical History (changes today):  Removed problem of OTITIS MEDIA (ICD-382.9) (ICD10-H66.90)  Removed problem of AMENORRHEA (ICD-626.0) (ICD10-N91.2)  .  [name]  Medications (prior to today's visit):  LANTUS 100 UNIT/ML SUBCUTANEOUS SOLUTION (INSULIN GLARGINE) 42 units  sq da  ily  HUMALOG KWIKPEN 100 UNIT/ML SUBCUTANEOUS SOLUTION PEN-INJECTOR (INSULIN LIS  PRO) Use 3 units before each meal;  Route: SUBCUTANEOUS  HYDROCHLOROTHIAZIDE 25 MG ORAL TABLET (HYDROCHLOROTHIAZIDE) Take one tablet   by mouth once daily; Route: ORAL  ATORVASTATIN CALCIUM 40 MG ORAL TABLET (ATORVASTATIN CALCIUM) Take one tabl  et by mouth once daily; Route: ORAL      * ACCUCHECK GLUCOMETER check sugars three times daily Dx: Diabetes type   II with retinopathy E11.319      RELION ALL-IN-ONE DEVICE (BLOOD GLUC METER DISP-STRIPS) Use three times   daily (whichever brand covered/with meter)      LANCETS (LANCETS) Use as directed.      INSULIN SYRINGE 30G X 1/2" 0.5 ML (  INSULIN SYRINGE-NEEDLE U-100) use to   inject insulin daily under skin      BD DISP NEEDLES 30G X 1/2" (NEEDLE (DISP)) use small needle to inject i  nsulin      ACCU-CHEK AVIVA IN VITRO STRIP (GLUCOSE BLOOD) use 1-3 times a day   dx   E11 .319; Route: IN VITRO  DOXYCYCLINE HYCLATE 100 MG ORAL CAPSULE (DOXYCYCLINE HYCLATE) Take one caps  ule by mouth twice daily; Route: ORAL      IBUPROFEN 800 MG ORAL TABLET (IBUPROFEN) one tablet orally three times   daily with meals as needed; Route: ORAL  .  Medications (after today's visit):  LANTUS 100 UNIT/ML SUBCUTANEOUS SOLUTION (INSULIN GLARGINE) 42 units  sq da  ily  HUMALOG KWIKPEN 100 UNIT/ML SUBCUTANEOUS SOLUTION PEN-INJECTOR (INSULIN LIS  PRO) Use 3 units before each meal; Route: SUBCUTANEOUS  HYDROCHLOROTHIAZIDE 25 MG ORAL TABLET (HYDROCHLOROTHIAZIDE) Take one tablet   by mouth once daily; Route: ORAL  ATORVASTATIN CALCIUM 40 MG ORAL TABLET (ATORVASTATIN CALCIUM) Take one tabl  et by mouth once daily; Route: ORAL      * ACCUCHECK GLUCOMETER check sugars three times daily Dx: Diabetes type   II with retinopathy E11.319      RELION ALL-IN-ONE DEVICE (BLOOD GLUC METER DISP-STRIPS) Use three times   daily (whichever brand covered/with meter)      LANCETS (LANCETS) Use as directed.      INSULIN SYRINGE 30G X 1/2" 0.5 ML (INSULIN SYRINGE-NEEDLE U-100) use to   inject insulin daily under skin      BD DISP NEEDLES 30G X 1/2" (NEEDLE  (DISP)) use small needle to inject i  nsulin      ACCU-CHEK AVIVA IN VITRO STRIP (GLUCOSE BLOOD) use 1-3 times a day   dx   E11 .319; Route: IN VITRO      IBUPROFEN 800 MG ORAL TABLET (IBUPROFEN) one tablet orally three times   daily with meals as needed; Route: ORAL  FLUOXETINE HCL 20 MG ORAL CAPSULE (FLUOXETINE HCL) Take one capsule by mout  h once daily.; Route: ORAL  .  Medications Reviewed:  Done  .  [name]  Allergies (prior to today's visit):  PENICILLIN V POTASSIUM (PENICILLIN V POTASSIUM) (Critical)  .  Allergies Reviewed:  Done  .  .  .  .  .  Vitals:   Ht: 64.5 in.  Wt: 217 lbs.  BMI (in-lb) 36.81  Temp: 97.2deg F.     BP (Initial [place] Screening): 142 / 87     BP (Rechecked, Actionable): 118 / 7  8 mmHg   Pulse Rate: 100 bpm O2 Sat: 99 %  .  [name]  Additional PE:   Gen: Alert, NAD, well hydrated, well developed  Head: NCAT  Ears: no external deformities, canals clear, TMs pearly gray, gross hearing   intact  Eyes: EOMI, PERRL, no scleral icterus, no conjunctival injection, vision   grossly normal  Nose: no bleeding  Mouth: Good dentition, no lesions, tongue midline, no exudate/erythema; sma  ll o/p   Neck: Supple, no ant/post cervical or supraclavicular lymphadenopathy,   no carotid bruits, thyroid not palpable  CV: RRR, no M/R/G, no edema, DP 2+ b/l  .  Pulm: CTA b/l, resp effort wnl  Abd: soft, nontender, nondistended, no abdominal bruits, no   hepatosplenomegaly,  Musc: station and gait wnl, moves all extremities without obvious   limitations  Skin: No rashes, no obvious bruising, no suspicious lesions  Neuro: CN 2-12 intact, DTR symmetric throughout,  hand grip 5/5 b/l, no   sensory deficits  Psych: Mood and affect appropriate  .  Breast: no masses; Axilla-no lymphadenopathy  .  .  .  .  .  Assessment /T/ Plan:   42 year old here for annual   .  .  .  DM uncontrolled with retinopathy   a1c today is13  (was really well control  led in pregnancy, has had financian and now emotional contraints     on lantus 42 and   humalog k (3-6 units depending on meal)     currently finances are less the issue but have let  her know re: pharmac  y flag programs to help with copays             eye care UTD, next appt 4/20           on  atorvastatin 40 mg            10/18 microalb neg ; recheck  HTN  but no microalbuminuria and no birth control right now; will keep on h  ydrochlorothiazide 25 mg  .  post partum hemorrhage, post hct 28; recheck today   .  [name]   Horner syndrome-Chest CT negative, 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 Monitoring with   eye care   .   depression/reactivity/irritability  - I recommended therapy, agrees to med  s, start fluoxetine, SE reviewed  .  poor sleep--consider sleep study   .  .  .  .  Likely PCOS (advised weight loss)  Likely hidradenitis-no active boils now; use antibacterial and soaks; consi  der cleocin gel if continued  pvax in future   pap done at [facility] 2019--will get results; order mammo ; discussed getting   [facility] with gyn at [facility]    .  [name]  Orders (this visit):  Albumin, Random Urine  [CPT-82043]  Request for Medical Records [MSC-00000]  RTC in 2 weeks [RTC-014]  RTC in 4 weeks [RTC-028]  LYTES [SODIUM] [CPT-82495]  BUN [BUN] [CPT-84520]  Creatinine (CR) [CPT-82565]  CBC  [CPT-85027]  Lipid Profile-Chol, HDL, Trig, LDL(calc) [CHOLESTEROL] [CPT-80061]  Est Preventive 40-64yo [CPT-99396]  .  GAD-7: Anxiety Screening Tool   Over the last 2 weeks how often has the patient been bothered by the follow  ing problems:  1. Feel nervous, anxious or on edge:    1  2. Not being able to stop or control worrying:  1  3. Worrying too much about different things:    1  4. Trouble relaxing:    1  5. Being so restless that it is hard to sit still:  0  6. Becoming easily annoyed or irritable:    2  7. Feeling afraid as if something awful might happen:   0  Score: 6     Interpretation:  Mild  .  Previous Score: 10 (04/29/2018 8:11:43 AM)  Previous Interpretation: Moderate (04/29/2018 8:11:43 AM)  .  Medications:  FLUOXETINE HCL 20 MG  ORAL CAPSULE (FLUOXETINE HCL) Take one capsule by mout  h once daily.  #30[Capsule] x 1      Route:ORAL      Entered and Authorized by:  [name], MD      Signed by:  [name], MD on 10/31/2018      Method used:    Electronically to               [facility] (retail[facility]  [address] [place], [place]  [address]              Ph: [phone]              Fax: [phone]      Note to Pharmacy: Route: ORAL;       RxID:   [id number]  LANTUS 100 UNIT/ML SUBCUTANEOUS SOLUTION (INSULIN GLARGINE) 42 units  sq da  ily  #3[Vial] x 6      Entered and Authorized by:  [name], MD      Signed by:  [name], MD on 10/31/2018      Method used:    Electronically to               [facility]              [address] [place], [place]  [address]              Ph: [phone]              Fax: [phone]      RxID:   [id number]  ATORVASTATIN CALCIUM 40 MG ORAL TABLET (ATORVASTATIN CALCIUM) Take one tabl  et by mouth once daily  #90[Tablet] x 4      Route:ORAL      Entered and Authorized by:  [name], MD      Signed by:  [name], MD on 10/31/2018      Method used:    Electronically to               [facility]              [address] [place], [place]  [address]              Ph: [phone]              Fax: [phone]      Note to Pharmacy: Route: ORAL;       RxID:   [id number]  HYDROCHLOROTHIAZIDE 25 MG ORAL TABLET (HYDROCHLOROTHIAZIDE) Take one tablet   by mouth once daily  #90[Tablet] x 4      Route:ORAL      Entered and Authorized by:  [name], MD      Signed by:  [name], MD on 10/31/2018      Method used:    Electronically to               [facility]              [address] [place], [place]  [address]              Ph: [phone]              Fax: [phone]      Note to Pharmacy: Route: ORAL;  RxID:    [id number]  Cancelled DOXYCYCLINE HYCLATE 100 MG ORAL CAPSULE (DOXYCYCLINE HYCLATE) Tak  e one capsule by mouth twice daily  #14[Capsule] x 0      Route:ORAL      Entered and Authorized by:  [name], MD      Signed by:  [name], MD on 10/31/2018      Method used:    Electronically to               [facility]              [address] [place], [place]  [address]              Ph: [phone]              Fax: [phone]      RxID:   [id number]  .  Patient Health Questionnaire (PHQ-9)  1.  Over the last 2 weeks how often have you been bothered by any of the fo  llowing problems?            Not at All (0) -  Several Days (1) -  More Than Half the Days (2)   -  Nearly Every Day (3)  a. Little interest or pleasure in doing things...[name]. 1  b. Feeling down, depressed, or hopeless...[name].  1  c. Trouble falling/staying asleep, sleeping too much..... 2  d. Feeling tired or having little energy..... 2  e. Poor appetite or overeating..... 0  f. Feeling bad about yourself - or that you are a failure or have let yours  elf or your family down...[name]. 1  g. Trouble concentrating on things such as reading the newspaper or watchin  g television..... 0  h. Moving or speaking so slowly that other people could have noticed.   Or   the opposite - being so fidgety or restless that you have been moving aroun  d a lot more than usual..... 0  i. Thoughts that you would be better off dead or of hurting yourself in som  e way..... 0  .  Initial PHQ-9: 6  Follow-Up PHQ-9: 7  .  PHQ-9 Screening: 7 scored  .  Patient Care Plan  .  [name]  Immunization Worksheet 2019   .  .  [name]  Electronically Signed by [name], MD on 10/31/2018 at 9:16 AM  ________________________________________________________________________

## 2018-11-05 ENCOUNTER — Ambulatory Visit

## 2018-11-05 NOTE — Progress Notes (Signed)
[facility]  [address]  [place] [address]  Main: [phone]  Fax: [phone]  Patient Portal: [email or web address]      November 07, 2018            MRN: [id number]            DOB: [birth year]   [name]   [address]   [place] [address]      The results of your recent tests are as follows:      The kidney function is a little lower than it was --let's really work together to get the diabetes back on track.  The cholesterol is not at goal. Are you taking your atorvastatin every day? If you are, we could and should increase your dose to 80 mg. I know you are seeing Adelina Mings next week--I hope you are able to make that appointment and we can work together to get everything back on track.     Cholesterol Tests:      Total Cholesterol   197  Normal 110-199    10/31/2018    HDL (good cholesterol)  51  Normal >40     10/31/2018    Triglyceride    97  Normal 40-250    10/31/2018    LDL (bad cholesterol)   127  Normal <160    10/31/2018     Diabetes Tests:      HgbA1c POC:    13.3 %  Normal 4.3 - 5.6    10/31/2018    Microalb/Cre    8  Normal 0 - 30    10/31/2018     Blood Count Tests:      Hematocrit:    38.0  Normal 32-45 women, 37-47 men  10/31/2018    Hemoglobin:    12.3  Normal 13.5-16 female, 56-15 female 10/31/2018    White Blood Cells:   7.8  Normal 4 - 11    10/31/2018    Platelets:    350  Normal 150-400    10/31/2018    MCV:     89.4  Normal 80-96    10/31/2018     Kidney Tests:      BUN:     14  Normal 6 - 24    10/31/2018    Creatinine:    1.16  Normal 0.57 - 1.30   10/31/2018    GFR (African-American):  67  Normal >= 60    10/31/2018    Microalb/Cre    8  Normal 0 - 30    10/31/2018     Electrolyte Tests:      Sodium:    132 MEQ/L  Normal 135-145   10/31/2018    Potassium:    4.3 MEQ/L  Normal 3.6-5.1   10/31/2018    Chloride:    99 MEQ/L  Normal 98-110   10/31/2018    Bicarbonate:    26 MEQ/L  Normal 20 - 30   10/31/2018     Anion Gap:    7   Normal 5 - 18    10/31/2018       Sincerely,       [name], MD   [facility]  [phone]          Created By Roswell Miners on 11/05/2018 at 03:59 PM    Electronically Signed By [name] MD on 11/07/2018 at 01:55 PM

## 2018-11-12 ENCOUNTER — Ambulatory Visit

## 2018-11-12 ENCOUNTER — Ambulatory Visit: Admitting: Internal Medicine

## 2018-11-12 NOTE — Progress Notes (Signed)
General Medicine Visit    Service Due by Standard Protocol Rules:     Initial Screening   * [place]: Nelva Bush Rinaldo Cloud)  Ht: 64.5 in.  Wt: 227.2 lbs.   BMI: 38.54  with shoes  Temp: 97.9 deg F.     BP (Initial [place] Screening): 130 / 73      BP (Rechecked, Actionable): 130 / 73 mmHg   HR: 83     Health Mgmt materials? Weight Education Handout for high BMI    Med List: PRINTED by [place] for patient   Travel outside of the [place] in past 28 days:: No  Chronic Pain Assessment: Does pt experience chronic pain ? NO  Have you received a Flu shot recently (where)? workplace  Approx Date: 08/06/2018  Smoking Assessment: Tobacco use? former smoker        Falls Risk Assessment:   In the past year, have you ... Had no falls  Difficulty with balance? NO  Need assistance with ambulation while here? NO    Comments: .......................................Marland KitchenVilla Herb  November 12, 2018 5:48 PM        Chief Complaint:   F/U MMP    History of Present Illness:   [name] is a 43 yo female pt of Dr. [name] with a PMHx significant for uncontrolled DMTII with retinopathy, HTN, anemia, depression/anxiety, Horner's syndrome, and obesity who presented to clinic for follow up MMP.    HYPERLIPIDEMIA  - Cholesterol was not at goal (total 197, LDL 127 on 12/26) despite being on Atorvastatin 40mg  daily.  - Patient reports that she has NOT been compliant with taking Atorvastatin daily. She admits that she has trouble remembering to take her pills. Sometimes she takes them in the AM, sometimes at work, and sometimes before bed. She knows this is not a good method as it is hard for her to keep track of when she did/didnt take the meds.  - She also admits that diet can be a lot better. She eats out or makes quick meals a lot, and snacks often. She states "chips/cookie/candy is easy and quick, getting healthier options are better I know, but they aren't quick and available when I get hungry."    DMTII  - Last A1c was 13.3% 10/31/18.  - She has been having both emotional  and financial strains adding to her noncompliance. Discussed that our pharmacist did try to reach out to her, but did not get her via telephone. She admits that she is "not good with her phone" but probably did get a call from them. She states she picked up her Lantus last week and paid about $75 for a 90 day supply. Per phone note from [place], seems equivalent to prices he was stating.  - She is supposed to be on 42 units of Lantus, but had not been taking this dose. Also on Humalog k (3-6 units depending on meal). She states that since she picked up her insulin on 12/26, she has been taking both Lantus and Humalog as prescribed.  - Micoalbumin/Cre 8; WNL. Pt not on ACEI currently.  - Checking sugars over the last 2 weeks; there are "in the 200s" per pt. Did not bring any reads in. She says "I am taking my insluin, so I know it is coming from what I am eating."  - No symptoms of hypoglycemia  - No polyuria, polyphagia or polydipsia.    (continued):   DEPRESSION  IRRITABILITY  - Patient feels stable. Denies SI/HI and feels safe  at home.  - She does admit to feeling overhwelemed emotionally and financially.  - Was recommended to start therapy and to start Fluoxetine at last visit 2 weeks ago.  - Started Fluvoaxamine after she picked it up on 12/27. She is tolerating fine without SEs.                    A complete ROS was done. All positive responses are listed in HPI section. All other systems reviewed in detail and are negative.      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)  PATELLAR DISLOCATION-MEDIAL RETINACULAR TEAR; RIGHT KNEE (ICD-836.3) (ICD10-S83.006)  OBESITY (ICD-278.00) (ICD10-E66.9)  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)  SEXUALLY TRANSMITTED DISEASE, EXPOSURE TO-H/O GC, TRICH, HPV, CHLAMYDIA (ICD-V01.6) (ICD10-Z20.2)  S/P CHOLECYSTECTOMY (ICD-V45.79) (ICD10-Z90.49)           Medications (prior to today's visit):  LANTUS 100 UNIT/ML SUBCUTANEOUS SOLUTION (INSULIN GLARGINE) 42 units  sq daily  HUMALOG KWIKPEN 100 UNIT/ML SUBCUTANEOUS SOLUTION PEN-INJECTOR (INSULIN LISPRO) Use 3 units before each meal; Route: SUBCUTANEOUS  HYDROCHLOROTHIAZIDE 25 MG ORAL TABLET (HYDROCHLOROTHIAZIDE) Take one tablet by mouth once daily; Route: ORAL  ATORVASTATIN CALCIUM 40 MG ORAL TABLET (ATORVASTATIN CALCIUM) Take one tablet by mouth once daily; Route: ORAL      * ACCUCHECK GLUCOMETER check sugars three times daily Dx: Diabetes type II with retinopathy E11.319      RELION ALL-IN-ONE DEVICE (BLOOD GLUC METER DISP-STRIPS) Use three times daily (whichever brand covered/with meter)      LANCETS (LANCETS) Use as directed.      INSULIN SYRINGE 30G X 1/2" 0.5 ML (INSULIN SYRINGE-NEEDLE U-100) use to inject insulin daily under skin      BD DISP NEEDLES 30G X 1/2" (NEEDLE (DISP)) use small needle to inject insulin      ACCU-CHEK AVIVA IN VITRO STRIP (GLUCOSE BLOOD) use 1-3 times a day   dx E11 .319; Route: IN VITRO      IBUPROFEN 800 MG ORAL TABLET (IBUPROFEN) one tablet orally three times daily with meals as needed; Route: ORAL  FLUOXETINE HCL 20 MG ORAL CAPSULE (FLUOXETINE HCL) Take one capsule by mouth once daily.; Route: ORAL    No Changes to Medication List   Medications Reviewed:  Done      Allergies (prior to today's visit):  PENICILLIN V POTASSIUM (PENICILLIN V POTASSIUM) (Critical)       Allergies Reviewed:  Done            Vitals:   Ht: 64.5 in.  Wt: 227.2 lbs.  BMI (in-lb)  38.54  Temp: 97.9deg F.     BP (Initial [place] Screening): 130 / 73     BP (Rechecked, Actionable): 130 / 73 mmHg   Pulse Rate: 83 bpm      Additional PE:   Constitutional: Alert, NAD, well appearing female.  Head: atraumatic, normocephalic  Eyes: EOM intact, no injection, no icterus, vision grossly normal  Mouth: Moist mucus membranes  Neck: Supple  Cardiovascular: RRR, S1 and S2 present, peripheral pulses 2+ bilaterally  Respiratory: No respiratory distress, no accessory muscle use, CTA bilaterally without wheezes or rales.  Neurol: CN II-XII grossly intact, station & gait normal  Psych: Oriented to all spheres, affect and mood appropriate        Assessment & Plan:   [name] is a 43 yo female pt of Dr. [name] with a PMHx significant for uncontrolled DMTII with retinopathy, HTN, anemia, depression/anxiety, Horner's syndrome, and obesity who presented to clinic for follow up MMP. VSS today.    HYPERLIPIDEMIA  - Lipid panel results from 12/26 include  total cholesterol of 197, LDL 127. She admitted to noncompliance with Atorvastatin 40mg  daily, therefore will not increase dose, but encourage compliance instead.  - Discussed major issue of compliance with patient. We came up with a plan that she will take her Atorvastatin in the morning after she takes her Lantus, as she does this every single day. She will put this plan into action tomorrow. Does not think she needs a refill on this medication.  - Discussed at length as well dietary changes, that are in the same realm as what she should be eating for her diabetes diet.     DMTII  - Patient seems to have made improvements over the last two weeks (taking insulin regularly, checking BS, thinking about food choices more so).  - She is taking Lantus 42 units in the AM and Humalog 6 units at mealtime. States with this, her sugars are in the 200s, but she is not making good food choices.  - She does think about healtheir options and tries to persue them, but states when she is  hungry she will eat what is most convenient.  - She reports that when she was pregnant, she did have Accucheck and found it to be a helpful reminder of her diabetes in general. She would like to look into getting this again. I will reach out to CDE/Stephanie regarding Accucheck patches. She has the glucometer at home, but does not have the patches.  - Discussed appropriate diabetes diet with patient, plate method, etc. She is very aware of the choices she is making and how they affect her diabetes, she must now put into action making these healthy decisions daily including low carbs/starches and low sugary drinks/sweet snacks.     (continued):   DEPRESSION  IRRITABILITY  - Patient is stable from this standpoint. She feels safe and denies SI/HI.  - She started Fluvoxamine just under 2 weeks ago and is tolerating it well without SEs. She had questions regarding the SEs of the medication, therefore reviewed these again. She expressed understanding. Will have her continue at this dose until F/U with PCP in 3 weeks.  - Patient has not reached out to any therapists yet. Discussed role of SW here in [facility] and she would like assistance with establishing care with a therapist. Will place referral today. Advised pt she should expect call from them regarding this and she provided best #, which is consistent with what is in the banner.  - Patient knows to call 911 or go to ED with any thoughts of harming herself or others, or feeling unsafe at home.        Patient Care Plan      Immunization Worksheet 2019           Created By Villa Herb on 11/12/2018 at 05:48 PM    Electronically Signed By Alesia Banda PA on 11/12/2018 at 07:47 PM

## 2018-11-12 NOTE — Progress Notes (Signed)
General Medicine Visit  .  Service Due by Standard Protocol Rules:   .  Initial Screening   * [place]: Nelva Bush Rinaldo Cloud)  Ht: 64.5 in.  Wt: 227.2 lbs.   BMI: 38.54  with shoes  Temp: 97.9 deg F.     BP (Initial [place] Screening): 130 / 73      BP (Rechecked, Actionable): 130 /   73 mmHg   HR: 83     Health Mgmt materials? Weight Education Handout for high BMI  .  Med List: PRINTED by [place] for patient   Travel outside of the [place] in past 28 days:: No  Chronic Pain Assessment: Does pt experience chronic pain ? NO  Have you received a Flu shot recently (where)? workplace  Approx Date: 08/06/2018  Smoking Assessment: Tobacco use? former smoker  .  .  .  Falls Risk Assessment:   In the past year, have you ... Had no falls  Difficulty with balance? NO  Need assistance with ambulation while here? NO  .  Comments: .......................................Marland KitchenVilla Herb  January  7, 2  020 5:48 PM  .  .  .  Chief Complaint:   F/U MMP  .  History of Present Illness:   [name] is a 43 yo female pt of Dr. [name] with a PMHx significant for unco  ntrolled DMTII with retinopathy, HTN, anemia, depression/anxiety, Horner's   syndrome, and obesity who presented to clinic for follow up MMP.  [name]  HYPERLIPIDEMIA  - Cholesterol was not at goal (total 197, LDL 127 on 12/26) despite being o  n Atorvastatin 40mg  daily.  - Patient reports that she has NOT been compliant with taking Atorvastatin   daily. She admits that she has trouble remembering to take her pills. Somet  imes she takes them in the AM, sometimes at work, and sometimes before bed.   She knows this is not a good method as it is hard for her to keep track of   when she did/didnt take the meds.  - She also admits that diet can be a lot better. She eats out or makes quic  k meals a lot, and snacks often. She states "chips/cookie/candy is easy and   quick, getting healthier options are better I know, but they aren't quick   and available when I get hungry."  .  DMTII  - Last A1c was 13.3%  10/31/18.  - She has been having both emotional and financial strains adding to her no  ncompliance. Discussed that our pharmacist did try to reach out to her, but   did not get her via telephone. She admits that she is "not good with her p  hone" but probably did get a call from them. She states she picked up her L  antus last week and paid about $75 for a 90 day supply. Per phone note from   [place], seems equivalent to prices he was stating.  - She is supposed to be on 42 units of Lantus, but had not been taking this   dose. Also on Humalog k (3-6 units depending on meal). She states that sin  ce she picked up her insulin on 12/26, she has been taking both Lantus and   Humalog as prescribed.  - Micoalbumin/Cre 8; WNL. Pt not on ACEI currently.  - Checking sugars over the last 2 weeks; there are "in the 200s" per pt. Di  d not bring any reads in. She says "I am taking my insluin, so  I know it is   coming from what I am eating."  - No symptoms of hypoglycemia  - No polyuria, polyphagia or polydipsia.  .  (continued):   DEPRESSION  IRRITABILITY  - Patient feels stable. Denies SI/HI and feels safe at home.  - She does admit to feeling overhwelemed emotionally and financially.  - Was recommended to start therapy and to start Fluoxetine at last visit 2   weeks ago.  - Started Fluvoaxamine after she picked it up on 12/27. She is tolerating f  ine without SEs.  .  .  .  .  .  .  .  .  .  A complete ROS was done. All positive responses are listed in HPI section.   All other systems reviewed in detail and are negative.  .  .  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)  HYPERTENSION (ICD-401.9) (ICD10-I10)  ANEMIA (ICD-285.9) (ICD10-D64.9)  HIDRADENITIS (ICD-705.83) (ICD10-L73.2)  SHORTNESS OF BREATH (ICD-786.05) (ICD10-R06.02)  ANNUAL EXAM (ICD-V72.31) (ICD10-Z00.00)  DEPRESSION (ICD-311) (ICD10-F32.9)      ANXIETY (ICD-300.00)  (ICD10-F41.9)  ABNORMAL PAP SMEAR 12/11 LSIL; COLPO 12/11; BX CIN I; 6/12 ASCUS PAP; 4/13   PAP NEG; HR HPV POS, NEG COLPO; 5/14 PAP/HR HPV NEG (ICD-795.00) (ICD10-R87  .619)  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  .  .  Medications (prior to today's visit):  LANTUS 100 UNIT/ML SUBCUTANEOUS SOLUTION (INSULIN GLARGINE) 42 units  sq da  ily  HUMALOG KWIKPEN 100 UNIT/ML SUBCUTANEOUS SOLUTION PEN-INJECTOR (INSULIN LIS  PRO) Use 3 units before each meal; Route: SUBCUTANEOUS  HYDROCHLOROTHIAZIDE 25 MG ORAL TABLET (HYDROCHLOROTHIAZIDE) Take one tablet   by mouth once daily; Route: ORAL  ATORVASTATIN CALCIUM 40 MG ORAL TABLET (ATORVASTATIN CALCIUM) Take one tabl  et by mouth once daily; Route: ORAL      * ACCUCHECK GLUCOMETER check sugars three times daily Dx: Diabetes type   II with retinopathy E11.319      RELION ALL-IN-ONE DEVICE (BLOOD GLUC METER DISP-STRIPS) Use three times   daily (whichever brand covered/with meter)      LANCETS (LANCETS) Use as directed.      INSULIN SYRINGE 30G X 1/2" 0.5 ML (INSULIN SYRINGE-NEEDLE U-100) use to   inject insulin daily under skin      BD DISP NEEDLES 30G X 1/2" (NEEDLE (DISP)) use small needle to inject i  nsulin      ACCU-CHEK AVIVA IN VITRO STRIP (GLUCOSE BLOOD) use 1-3 times a day   dx   E11 .319; Route: IN VITRO      IBUPROFEN 800 MG ORAL TABLET (IBUPROFEN) one tablet orally three times   daily with meals as needed; Route: ORAL  FLUOXETINE HCL 20 MG ORAL CAPSULE (FLUOXETINE HCL) Take one capsule by mout  h once daily.; Route: ORAL  .  No Changes to Medication List   Medications Reviewed:  Done  .  [name]  Allergies (prior to today's visit):  PENICILLIN V  POTASSIUM (PENICILLIN V POTASSIUM) (Critical)  .  Allergies Reviewed:  Done  .  .  .  .  .  Vitals:   Ht: 64.5 in.  Wt: 227.2 lbs.  BMI (in-lb) 38.54  Temp: 97.9deg F.  BP (Initial [place] Screening): 130 / 73     BP (Rechecked, Actionable): 130 / 7  3 mmHg   Pulse Rate: 83 bpm  .  .  Additional PE:   Constitutional: Alert, NAD, well appearing female.  Head: atraumatic, normocephalic  Eyes: EOM intact, no injection, no icterus, vision grossly normal  Mouth: Moist mucus membranes  Neck: Supple  Cardiovascular: RRR, S1 and S2 present, peripheral pulses 2+ bilaterally  Respiratory: No respiratory distress, no accessory muscle use, CTA bilatera  lly without wheezes or rales.  Neurol: CN II-XII grossly intact, station /T/ gait normal  Psych: Oriented to all spheres, affect and mood appropriate  .  .  .  Assessment /T/ Plan:   Thelia is a 43 yo female pt of Dr. [name] with a PMHx significant for unco  ntrolled DMTII with retinopathy, HTN, anemia, depression/anxiety, Horner's   syndrome, and obesity who presented to clinic for follow up MMP. VSS today.  [name]  HYPERLIPIDEMIA  - Lipid panel results from 12/26 include  total cholesterol of 197, LDL 127  . She admitted to noncompliance with Atorvastatin 40mg  daily, therefore wil  l not increase dose, but encourage compliance instead.  - Discussed major issue of compliance with patient. We came up with a plan   that she will take her Atorvastatin in the morning after she takes her Lant  [place], as she does this every single day. She will put this plan into action t  omorrow. Does not think she needs a refill on this medication.  - Discussed at length as well dietary changes, that are in the same realm a  s what she should be eating for her diabetes diet.   [name]  DMTII  - Patient seems to have made improvements over the last two weeks (taking i  nsulin regularly, checking BS, thinking about food choices more so).  - She is taking Lantus 42 units in the AM and Humalog 6 units at mealtime.    States with this, her sugars are in the 200s, but she is not making good fo  od choices.  - She does think about healtheir options and tries to persue them, but stat  es when she is hungry she will eat what is most convenient.  - She reports that when she was pregnant, she did have Accucheck and found   it to be a helpful reminder of her diabetes in general. She would like to l  ook into getting this again. I will reach out to CDE/Stephanie regarding Ac  cucheck patches. She has the glucometer at home, but does not have the patc  hes.  - Discussed appropriate diabetes diet with patient, plate method, etc. She   is very aware of the choices she is making and how they affect her diabetes  , she must now put into action making these healthy decisions daily includi  ng low carbs/starches and low sugary drinks/sweet snacks.   .  (continued):   DEPRESSION  IRRITABILITY  - Patient is stable from this standpoint. She feels safe and denies SI/HI.  - She started Fluvoxamine just under 2 weeks ago and is tolerating it well   without SEs. She had questions regarding the SEs of the medication, therefo  re reviewed these again. She expressed understanding. Will have her continu  e at this dose until F/U with PCP in 3 weeks.  - Patient has not reached out to any therapists yet. Discussed role of SW h  ere  in [facility] and she would like assistance with establishing care with a ther  apist. Will place referral today. Advised pt she should expect call from th  em regarding this and she provided best #, which is consistent with what is   in the banner.  - Patient knows to call 911 or go to ED with any thoughts of harming hersel  f or others, or feeling unsafe at home.  .  .  .  Patient Care Plan  .  [name]  Immunization Worksheet 2019   .  .  .  .  Electronically Signed by Alesia Banda PA on 11/12/2018 at 7:47 PM  ________________________________________________________________________

## 2018-11-13 ENCOUNTER — Ambulatory Visit

## 2018-11-14 ENCOUNTER — Ambulatory Visit

## 2018-11-14 NOTE — Telephone Encounter (Signed)
 ---- Converted from flag ----  ---- 11/14/2018 1:10 PM, Neta Ehlers, PharmD wrote:  I wonder if she just means the [facility]? With her insurance it would be $0 for the machine and $50/month copays for the sensors if that's what she's thinking about. That's the only patch-like one I can think of.    ---- 11/13/2018 12:27 PM, Alesia Banda PA wrote:  Jiles Crocker,  This was info from the patient, so I may be wrong with the name - I appoligize. When looking at her med list, She was on the Accu-check. I do not see that there were any additional devices given to her, at least here in [facility]. Sorry for the confusion!    ---- 11/13/2018 11:32 AM, Neta Ehlers, PharmD wrote:  Rica Mote and August Saucer not aware of an Accu-chek patch system. I only see the tradtional glucometers on the Accu-chek website. Darl Pikes do you have any insight on this?    Thanks,  Stephanie  ---- 11/12/2018 7:49 PM, Alesia Banda PA wrote:  Jiles Crocker and Darl Pikes. I saw Isbella in a routine follow up visit today; she has a history of uncontrolled DMTII. In the past she has had a  glucose monitor (?Accu-check), which she found very helpful with monitoring her BS. She does have a component of financial instability, therefore unsure if this is a good option for her, but she did inquire about getting sensors again for her glucometer so it makes it more simple for her to check (she still has glucometer at home). Any insight or help on how we could go about this? Thanks so much!  ------------------------------  RESPONSE/ORDERS:    Called pt to discuss the above (?does she want [place] to sent the [facility] for above cost). No answer - LMOM requesting that patient call back.  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) (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)  HYPERTENSION (ICD-401.9) (ICD10-I10)  ANEMIA (ICD-285.9)  (ICD10-D64.9)  HIDRADENITIS (ICD-705.83) (ICD10-L73.2)  SHORTNESS OF BREATH (ICD-786.05) (ICD10-R06.02)  ANNUAL EXAM (ICD-V72.31) (ICD10-Z00.00)  DEPRESSION (ICD-311) (ICD10-F32.9)      ANXIETY (ICD-300.00) (ICD10-F41.9)  ABNORMAL PAP SMEAR 12/11 LSIL; COLPO 12/11; BX CIN I; 6/12 ASCUS PAP; 4/13 PAP NEG; HR HPV POS, NEG COLPO; 5/14 PAP/HR HPV NEG (ICD-795.00) (ICD10-R87.619)  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)  PATELLAR DISLOCATION-MEDIAL RETINACULAR TEAR; RIGHT KNEE (ICD-836.3) (ICD10-S83.006)  OBESITY (ICD-278.00) (ICD10-E66.9)  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)  SEXUALLY TRANSMITTED DISEASE, EXPOSURE TO-H/O GC, TRICH, HPV, CHLAMYDIA (ICD-V01.6) (ICD10-Z20.2)  S/P CHOLECYSTECTOMY (ICD-V45.79) (ICD10-Z90.49)    Allergies:   PENICILLIN V POTASSIUM (PENICILLIN V POTASSIUM) (Critical)    Meds (prior to this call):   LANTUS 100 UNIT/ML SUBCUTANEOUS SOLUTION (INSULIN GLARGINE) 42 units  sq daily  HUMALOG KWIKPEN 100 UNIT/ML SUBCUTANEOUS SOLUTION PEN-INJECTOR (INSULIN LISPRO) Use 3 units before each meal; Route: SUBCUTANEOUS  HYDROCHLOROTHIAZIDE 25 MG ORAL TABLET (HYDROCHLOROTHIAZIDE) Take one tablet by mouth once daily; Route: ORAL  ATORVASTATIN CALCIUM 40 MG ORAL TABLET (ATORVASTATIN CALCIUM) Take one tablet by mouth once daily; Route: ORAL      * ACCUCHECK GLUCOMETER check sugars three times daily Dx: Diabetes type II with retinopathy E11.319      RELION ALL-IN-ONE DEVICE (BLOOD GLUC METER DISP-STRIPS) Use three times daily (whichever brand covered/with meter)      LANCETS (LANCETS) Use as directed.  INSULIN SYRINGE 30G X 1/2" 0.5 ML (INSULIN SYRINGE-NEEDLE U-100) use to inject insulin daily under skin      BD DISP NEEDLES 30G X 1/2" (NEEDLE (DISP)) use small needle to inject insulin      ACCU-CHEK AVIVA IN VITRO STRIP (GLUCOSE BLOOD) use 1-3 times a day   dx E11 .319; Route: IN VITRO      IBUPROFEN 800 MG ORAL TABLET (IBUPROFEN) one tablet orally three times daily with meals as needed; Route:  ORAL  FLUOXETINE HCL 20 MG ORAL CAPSULE (FLUOXETINE HCL) Take one capsule by mouth once daily.; Route: ORAL            Created By Alesia Banda PA on 11/14/2018 at 01:17 PM    Electronically Signed By Alesia Banda PA on 11/14/2018 at 04:18 PM

## 2018-11-15 ENCOUNTER — Ambulatory Visit

## 2018-11-15 MED ORDER — CONTINUOUS BLOOD GLUC SENSOR: 2 | 3 refills | 0 days | Status: AC

## 2018-11-15 NOTE — Progress Notes (Signed)
 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)  PATELLAR DISLOCATION-MEDIAL RETINACULAR TEAR; RIGHT KNEE (ICD-836.3) (ICD10-S83.006)  OBESITY (ICD-278.00) (ICD10-E66.9)  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)  SEXUALLY TRANSMITTED DISEASE, EXPOSURE TO-H/O GC, TRICH, HPV, CHLAMYDIA (ICD-V01.6) (ICD10-Z20.2)  S/P CHOLECYSTECTOMY (ICD-V45.79) (ICD10-Z90.49)    Allergies   PENICILLIN V POTASSIUM (PENICILLIN V POTASSIUM) (Critical)      Medications   LANTUS 100 UNIT/ML SUBCUTANEOUS SOLUTION (INSULIN GLARGINE) 42 units  sq daily  HUMALOG KWIKPEN 100 UNIT/ML SUBCUTANEOUS SOLUTION PEN-INJECTOR (INSULIN LISPRO) Use 3 units before each meal; Route: SUBCUTANEOUS  HYDROCHLOROTHIAZIDE 25 MG ORAL TABLET (HYDROCHLOROTHIAZIDE) Take one tablet by mouth once daily; Route: ORAL  ATORVASTATIN CALCIUM 40 MG ORAL TABLET (ATORVASTATIN CALCIUM) Take one tablet by mouth once daily; Route: ORAL      * ACCUCHECK GLUCOMETER check sugars three times daily Dx: Diabetes type II with retinopathy E11.319      RELION ALL-IN-ONE DEVICE (BLOOD GLUC METER DISP-STRIPS) Use three times daily (whichever brand covered/with meter)      LANCETS (LANCETS) Use as directed.      INSULIN SYRINGE 30G X 1/2" 0.5 ML (INSULIN SYRINGE-NEEDLE U-100) use to inject insulin daily under skin      BD DISP NEEDLES 30G X 1/2" (NEEDLE (DISP)) use small needle to inject insulin      ACCU-CHEK AVIVA IN VITRO  STRIP (GLUCOSE BLOOD) use 1-3 times a day   dx E11 .319; Route: IN VITRO      IBUPROFEN 800 MG ORAL TABLET (IBUPROFEN) one tablet orally three times daily with meals as needed; Route: ORAL  FLUOXETINE HCL 20 MG ORAL CAPSULE (FLUOXETINE HCL) Take one capsule by mouth once daily.; Route: ORAL  FREESTYLE LIBRE 14 DAY SENSOR (CONTINUOUS BLOOD GLUC SENSOR) Use as directed to monitor blood sugar .  Change every 14 days,  E 11.9          Services Due:     Items recorded during this note:      Prescriptions:  FREESTYLE LIBRE 14 DAY SENSOR (CONTINUOUS BLOOD GLUC SENSOR) Use as directed to monitor blood sugar .  Change every 14 days,  E 11.9  #2[Unspecified] x 3   Entered by: [name], MD   Authorized by: Alesia Banda PA   Signed by: [name], MD on 11/15/2018   Method used: Electronically to      [facility] (retail)     [address]     [place], [place]  [address]     Ph: [phone]     Fax: [phone]   RxID: [id number]        Immunization Worksheet 2016     added to med list.  If she doesn't want to pay the 50, she could do it half the month and test the rest.  My question would be how much are her strips?  ......................................Marland KitchenJeanann Lewandowsky RN  November 15, 2018 10:51 AM    Thank you for your help! Yes, that is something we can discuss with her at  her next visit 2/13. I have tried to reach her via telephone but have not been able to get a hold of her. Will send to PCP as well.  ......................................Marland KitchenAlesia Banda PA  November 15, 2018 10:55 AM  Thanks for the info and for reaching out to her .......................................[name], MD  November 15, 2018 1:58 PM    Acu-check [employer] test stirips will be $30/30 day supply. ......................................Marland KitchenNeta Ehlers, PharmD  November 15, 2018 3:26 PM        Created By Jeanann Lewandowsky RN on 11/15/2018 at 10:32 AM    Electronically Signed By Neta Ehlers PharmD on 11/15/2018 at 04:14 PM

## 2018-11-26 ENCOUNTER — Ambulatory Visit

## 2018-11-26 MED ORDER — FLUOXETINE HCL: 1 | 30 | 1 refills | 0 days | Status: DC

## 2018-12-16 ENCOUNTER — Ambulatory Visit

## 2018-12-16 MED ORDER — FLUOXETINE HCL: 1 | 90 | 1 refills | 0 days | Status: DC

## 2018-12-19 ENCOUNTER — Ambulatory Visit: Admitting: Internal Medicine

## 2018-12-19 ENCOUNTER — Ambulatory Visit

## 2018-12-19 ENCOUNTER — Ambulatory Visit: Admit: 2018-12-19 | Payer: HMO

## 2018-12-19 MED ORDER — IBUPROFEN: 1 | 30 | 1 refills | 0 days | Status: DC

## 2018-12-19 NOTE — Progress Notes (Signed)
 Pharmacist Visit  .  [name]  Chief Complaint:   Patient presents to clinic to establish clinical pharmacy services for for   collaborative management of diabetes. Pt is a 43 year old female with PMH s  ignificant for: diabetes, hypertension, obesity  .  Patient goals: wants to get [place]  .  History of Present Illness:   DIABETES  DM Hx  - Duration:  since /R/43 years old  - Endocrinologist: only when pregnant  .  Current meds: Lantus 42 units (giving 45 units most days) morning, Humalog   usually 6 units with meals (also takes 3 units w snack)  .  Hypoglycemia: happened twice this month; clammy, tired. Only checked BS wit  h one incidence, thinks that one happened because she did not snack the nig  ht before. Did not check BS or identify cause with other incidence  Meter: [place]  Testing BG: checks fasting blood sugars a few times/week at most, also goes   a few weeks at a time without  Home BG readings: Fastings: 2/11 53 drank some apple juice- did not recheck   after juice;   1/21 135, 1/20 106, 1/16 96, 1/14 112, 17/ 254, 1/6 321  .  HYPERTENSION   Current meds: hydrochorlothiazide 25 mg daily  Previously tried: HCTZ-lisinopril, nifedipine (pregnancy)  Home BP: not checking  .  HYPERLIPIDEMIA/CARDIOVASCULAR PREVENTION  Current meds: atorvastatin 40 mg daily  Side effects: No myalgias  .  LIFESTYLE  Diet: eats rice for bkfast; last night dinner was french fries and chicken;   snacks throughout the day  Beverages: apple juice/ginger ale few times a week, mostly water  Alcohol: 2 drinks/week  Activity/Exercise: minimal  .  MED MANAGEMENT  Does not have assistance managing and taking medications.  Patient does not   use pill box or alarm.  Medication side effects: None   Medication cost: no issues at this time  Medication adherence: reports no missed doses in the past 2 weeks  Barriers to adherence: prioritizes other things above blood sugar checks  .  Medication list and drug allergies reviewed with the patient.  Logician, Dr.   Tiajuana Amass, and ECW med lists reviewed.  Patient is a good historian of medicat  ions and indications. The following discrepancies were discovered: Ibuprofe  n removed from med list, updated med list with self adjusted insulin doses  .  .  .  .  .  .  .  .  .  .  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  .  .  Medications (prior to today's visit):  LANTUS 100 UNIT/ML SUBCUTANEOUS SOLUTION (INSULIN GLARGINE) 42 units  sq da  ily  HUMALOG KWIKPEN 100 UNIT/ML SUBCUTANEOUS SOLUTION PEN-INJECTOR (INSULIN LIS  PRO) Use 3 units before  each meal; Route: SUBCUTANEOUS  HYDROCHLOROTHIAZIDE 25 MG ORAL TABLET (HYDROCHLOROTHIAZIDE) Take one tablet   by mouth once daily; Route: ORAL  ATORVASTATIN CALCIUM 40 MG ORAL TABLET (ATORVASTATIN CALCIUM) Take one tabl  et by mouth once daily; Route: ORAL      * ACCUCHECK GLUCOMETER check sugars three times daily Dx: Diabetes type   II with retinopathy E11.319      RELION ALL-IN-ONE DEVICE (BLOOD GLUC METER DISP-STRIPS) Use three times   daily (whichever brand covered/with meter)      LANCETS (LANCETS) Use  as directed.      INSULIN SYRINGE 30G X 1/2" 0.5 ML (INSULIN SYRINGE-NEEDLE U-100) use to   inject insulin daily under skin      BD DISP NEEDLES 30G X 1/2" (NEEDLE (DISP)) use small needle to inject i  nsulin      ACCU-CHEK AVIVA IN VITRO STRIP (GLUCOSE BLOOD) use 1-3 times a day   dx   E11 .319; Route: IN VITRO      IBUPROFEN 800 MG ORAL TABLET (IBUPROFEN) one tablet orally three times   daily with meals as needed; Route: ORAL  FLUOXETINE HCL 20 MG CAPSULE (FLUOXETINE HCL) TAKE 1 CAPSULE BY MOUTH EVERY   DAY  FREESTYLE LIBRE 14 DAY SENSOR (CONTINUOUS BLOOD GLUC SENSOR) Use as directe  d to monitor blood sugar .  Change every 14 days,  E 11.9  .  Medications (after today's visit):  LANTUS 100 UNIT/ML SUBCUTANEOUS SOLUTION (INSULIN GLARGINE) 45 units sq dai  ly  HUMALOG KWIKPEN 100 UNIT/ML SUBCUTANEOUS SOLUTION PEN-INJECTOR (INSULIN LIS  PRO) Use 6 units before each meal, 3 units with snack; Route: SUBCUTANEOUS  HYDROCHLOROTHIAZIDE 25 MG ORAL TABLET (HYDROCHLOROTHIAZIDE) Take one tablet   by mouth once daily; Route: ORAL  ATORVASTATIN CALCIUM 40 MG ORAL TABLET (ATORVASTATIN CALCIUM) Take one tabl  et by mouth once daily; Route: ORAL      * ACCUCHECK GLUCOMETER check sugars three times daily Dx: Diabetes type   II with retinopathy E11.319      RELION ALL-IN-ONE DEVICE (BLOOD GLUC METER DISP-STRIPS) Use three times   daily (whichever brand covered/with meter)      LANCETS (LANCETS) Use as directed.      INSULIN SYRINGE 30G X 1/2" 0.5 ML (INSULIN SYRINGE-NEEDLE U-100) use to   inject insulin daily under skin      BD DISP NEEDLES 30G X 1/2" (NEEDLE (DISP)) use small needle to inject i  nsulin      ACCU-CHEK AVIVA IN VITRO STRIP (GLUCOSE BLOOD) use 1-3 times a day   dx   E11 .319; Route: IN VITRO  FLUOXETINE HCL 20 MG CAPSULE (FLUOXETINE HCL) TAKE 1 CAPSULE BY MOUTH EVERY   DAY  FREESTYLE LIBRE 14 DAY SENSOR (CONTINUOUS BLOOD GLUC SENSOR) Use as directe  d to monitor blood sugar .  Change every 14 days,  E  11.9  .  Medications Reviewed:  Done  .  [name]  Allergies (prior to today's visit):  PENICILLIN V POTASSIUM (PENICILLIN V POTASSIUM) (Critical)  .  .  .  .  .  .  Vitals:   Ht: 64.5 in.    .  .  .  .  .  .  Assessment /T/ Plan:   DIABETES  A1c of 13.3% in Dec 2019 not at goal of A1c < 7% per ADA guidelines, increa  sed from 11.8%. Patient is experiencing some hypoglycemia, hypoglycemia sym  ptoms and management reviewed. Fasting blood sugars are at goal, unable  to   assess mealtime coverage as she is currently not checking blood sugars thro  ughout the day. Taking Lantus 42-45 units daily and Humalog 6 units with me  als without missing doses, but does not take set dose. She is motivated to   make changes to her health so she can be there long term for her daughter.  Plan:   1. Continue with Lantus 45 units and Humalog 3-6 units with snacks and meal  s for now. Plan to adjust based on blood sugar readings at future visit, su  sepect Humalog will need to be increased.  2. Test blood sugars 4 x/day for Libre  3. Will start to make small dietary changes as discussed.  4. Due for A1c at next visit.  [name]  HYPERTENSION  Blood pressure 133/88 is not at goal of <130/80 per ACC/AHA 2019 guidelines  . Patient prescribed hydrochlorothiazide 25 mg daily, taking with good adhe  rence.  .  Plan:   1. Continue hydrochlorothiazide 25 mg daily. If high on re-check can consid  er adding lisinopril back on.   [name]  HYPERLIPIDEMIA/CARDIOVASCULAR PREVENTION  Patient is on a moderate intensity statin. Patient is on appropriate statin   therapy for cardiovascular prevention.   .  Plan:  1. Continue atorvastatin 40 mg daily.   .  MED MANAGEMENT/ADHERENCE  Patient has fair adherence. Medication list and allergies reconciled and up  dated based on patient report. Patient education on dosing, indication, sto  rage, side effects, and administration provided.   .  Plan: Will buy a pill box and use alarm to help for reminders   .  PharmD Follow up in:  2-4 weeks as patient willing - will call to set up.   Discussed with Dr. [name]   .  .  .  .  .  Electronically Signed by Neta Ehlers, PharmD on 12/19/2018 at 10:47 AM  __________________________________________________

## 2018-12-19 NOTE — Progress Notes (Signed)
 General Medicine Visit    Service Due by Standard Protocol Rules:     Initial Screening   * [place]: [place] ([place])  Ht: 64.5 in.  Wt: 228.4 lbs.   BMI: 38.74  Temp: 98.6 deg F.     BP (Initial [place] Screening): 135 / 88      BP (Rechecked, Actionable): 135 / 88 mmHg   HR: 80    O2 Sat: 100 %    Med List: PRINTED byfor patient   Travel outside of the [place] in past 28 days:: No  Chronic Pain Assessment: Does pt experience chronic pain ? NO  Smoking Assessment: Tobacco use? never smoker        Falls Risk Assessment:   In the past year, have you ... Had no falls  Difficulty with balance? NO  Need assistance with ambulation while here? NO    Comments: ......................................Marland KitchenCyndia Skeeters  December 19, 2018 9:16 AM        Chief Complaint:   43 year old here for f/u MMP     History of Present Illness:   DM   not checking a lot of sugars    a few lows__one  65, one clinical. 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)  PATELLAR DISLOCATION-MEDIAL RETINACULAR TEAR;  RIGHT KNEE (ICD-836.3) (ICD10-S83.006)  OBESITY (ICD-278.00) (ICD10-E66.9)  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)  SEXUALLY TRANSMITTED DISEASE, EXPOSURE TO-H/O GC, TRICH, HPV, CHLAMYDIA (ICD-V01.6) (ICD10-Z20.2)  S/P CHOLECYSTECTOMY (ICD-V45.79) (ICD10-Z90.49)           Medications (prior to today's visit):  LANTUS 100 UNIT/ML SUBCUTANEOUS SOLUTION (INSULIN GLARGINE) 42 units  sq daily  HUMALOG KWIKPEN 100 UNIT/ML SUBCUTANEOUS SOLUTION PEN-INJECTOR (INSULIN LISPRO) Use 3 units before each meal; Route: SUBCUTANEOUS  HYDROCHLOROTHIAZIDE 25 MG ORAL TABLET (HYDROCHLOROTHIAZIDE) Take one tablet by mouth once daily; Route: ORAL  ATORVASTATIN CALCIUM 40 MG ORAL TABLET (ATORVASTATIN CALCIUM) Take one tablet by mouth once daily; Route: ORAL      * ACCUCHECK GLUCOMETER check sugars three times daily Dx: Diabetes type II with retinopathy E11.319      RELION ALL-IN-ONE DEVICE (BLOOD GLUC METER DISP-STRIPS) Use three times daily (whichever brand covered/with meter)      LANCETS (LANCETS) Use as directed.      INSULIN SYRINGE 30G X 1/2" 0.5 ML (INSULIN SYRINGE-NEEDLE U-100) use to inject insulin daily under skin      BD DISP NEEDLES 30G  X 1/2" (NEEDLE (DISP)) use small needle to inject insulin      ACCU-CHEK AVIVA IN VITRO STRIP (GLUCOSE BLOOD) use 1-3 times a day   dx E11 .319; Route: IN VITRO      IBUPROFEN 800 MG ORAL TABLET (IBUPROFEN) one tablet orally three times daily with meals as needed; Route: ORAL  FLUOXETINE HCL 20 MG CAPSULE (FLUOXETINE HCL) TAKE 1 CAPSULE BY MOUTH EVERY DAY  FREESTYLE LIBRE 14 DAY SENSOR (CONTINUOUS BLOOD GLUC SENSOR) Use as directed to monitor blood sugar .  Change every 14 days,  E 11.9    No Changes to Medication List       Allergies (prior to today's visit):  PENICILLIN V POTASSIUM (PENICILLIN V POTASSIUM) (Critical)                 Vitals:   Ht: 64.5 in.  Wt: 228.4 lbs.  BMI (in-lb) 38.74  Temp: 98.6deg F.     BP (Initial [place] Screening): 135 / 88     BP (Rechecked, Actionable): 135 / 88  mmHg   Pulse Rate: 80 bpm O2 Sat: 100 %      Additional PE:   General: Well appearing  Head: NCAT  Gait: normal  Eyes-anicteric  Neck: supple  Psych: affect and mood appropriate        Assessment & Plan:   62 year ol dhrer for f/u MMP       DM uncontrolled with retinopathy   a1c 1/20 13  (was really well controlled in pregnancy, has had financian and emotional contraints) improving sugars but needs to check more; will check QID x 2 weeks and come back to meet with pharmacy for CGM     on lantus 45 and  humalog k (3-6 units depending on meal)     currently finances are less the issue but have let  her know re: pharmacy flag programs to help with copays             eye care UTD, next appt 4/20           on  atorvastatin 40 mg ; adherence issue; recheck LDL next time            12/19 microalb neg ;               HTN  but no microalbuminuria and no birth control right now; will keep on hydrochlorothiazide 25 mg; consider ACE once birth control reesolved      HLD, ldl 127 but poor adherence; recheck next time      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 Monitoring with eye care      depression/reactivity/irritability  - I recommended therapy, has list from SW, she is to call; on  fluoxetine, would recommend incraesing to 2 /day (40 mg) she will consider     poor sleep--consider sleep study           Likely PCOS (advised weight loss)  Likely hidradenitis-no active boils now; use antibacterial and soaks; consider cleocin gel if continued     pvax in future   pap done at [facility] 2019--will get results; order mammo ; discussed getting [facility] with gyn [facility]         Orders (this visit):  RTC in 2 weeks [RTC-014]  RTC in 4 weeks [RTC-028]  Est Level 4 [CPT-99214]    Patient Care Plan      Immunization Worksheet 2019  Created By Cyndia Skeeters on 12/19/2018 at 09:11 AM    Electronically Signed By [name] MD on 12/19/2018 at 05:07 PM

## 2018-12-19 NOTE — Progress Notes (Signed)
 Outreach to patient for [employer]   .  [name]  Patient Outreach Program   Spoke with the patient:  NO  Outreach Programs:  LCO, Schedule Visit, and Chronic Care  .  Unable to speak with patient:  1st voice message  Notes: 2021- Risk Contract. Patient is due for Diabetes A1C Screen needed  2021- Risk Contract. Patient is due for a   Visit needed  .  .  .  .  .  .  Questions for patients; to direct next steps   .  .  .  .  Patient Screening   .  .  .  .  .  .  .  Mandatory In-Person COVID Screening Questions   I am going to ask you a few questions, prior to scheduling your visit.   [If done Pre-visit: Any positive responses, route to the Team RN immediatel  y.]   [If done for [facility] Students: Record results and make Phone Note, then   route to 'Student, NU'.]   1. Do you have any of these new symptoms in the past ten days?   .  2. In the past two weeks, were you within 6 ft of someone   .  3. COVID tested   4. Quarantined   5. Notes or questions from patient (please .sign to complete)   .  .  .  .  .  Electronically Signed by Phillis Knack on 11/06/2019 at 3:13 PM  ________________________________________________________________________

## 2018-12-19 NOTE — Progress Notes (Signed)
General Medicine Visit  .  Service Due by Standard Protocol Rules:   .  Initial Screening   * [place]: [place] ([place])  Ht: 64.5 in.  Wt: 228.4 lbs.   BMI: 38.74  Temp: 98.6 deg F.     BP (Initial [place] Screening): 135 / 88      BP (Rechecked, Actionable): 135 /   88 mmHg   HR: 80    O2 Sat: 100 %  .  Med List: PRINTED by [place] for patient   Travel outside of the [place] in past 28 days:: No  Chronic Pain Assessment: Does pt experience chronic pain ? NO  Smoking Assessment: Tobacco use? never smoker  .  .  .  Falls Risk Assessment:   In the past year, have you ... Had no falls  Difficulty with balance? NO  Need assistance with ambulation while here? NO  .  Comments: ......................................Marland KitchenCyndia Skeeters  December 19, 2018 9:16 AM  .  .  .  Chief Complaint:   43 year old here for f/u MMP   .  History of Present Illness:   DM   not checking a lot of sugars    a few lows__one  65, one clinical. didn't skip meal, once skpped bedtime s  nack; not writing anything down   would like CGM    is taking her meds daily and lantus 45, humalog 3-6 units   .  HLD; last LDL 127 wason atorvastatin 40 but not daily   .  HTN: o n hctz daily  .  depression-started fluoxetine and feels that it helps; does still feel some   irritability   sleep is so-so; some irritability   .  .  .  .  .  .  .  .  .  [name]  weight up 1 lb  no polys   .  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  .  .  Medications (prior to today's visit):  LANTUS 100 UNIT/ML SUBCUTANEOUS SOLUTION (INSULIN GLARGINE) 42 units  sq da  ily  HUMALOG KWIKPEN 100 UNIT/ML SUBCUTANEOUS SOLUTION PEN-INJECTOR (INSULIN LIS  PRO) Use 3 units before each meal; Route: SUBCUTANEOUS  HYDROCHLOROTHIAZIDE 25 MG ORAL TABLET (HYDROCHLOROTHIAZIDE) Take one tablet   by mouth once daily; Route: ORAL  ATORVASTATIN CALCIUM 40 MG ORAL TABLET (ATORVASTATIN CALCIUM) Take one tabl  et by mouth once daily; Route: ORAL      * ACCUCHECK GLUCOMETER check sugars three times daily Dx: Diabetes type   II with retinopathy E11.319      RELION ALL-IN-ONE DEVICE (BLOOD GLUC METER DISP-STRIPS) Use three times   daily (whichever brand covered/with meter)      LANCETS (LANCETS) Use as directed.      INSULIN  SYRINGE 30G X 1/2" 0.5 ML (INSULIN SYRINGE-NEEDLE U-100) use to   inject insulin daily under skin      BD DISP NEEDLES 30G X 1/2" (NEEDLE (DISP)) use small needle to inject i  nsulin      ACCU-CHEK AVIVA IN VITRO STRIP (GLUCOSE BLOOD) use 1-3 times a day   dx   E11 .319; Route: IN VITRO      IBUPROFEN 800 MG ORAL TABLET (IBUPROFEN) one tablet orally three times   daily with meals as needed; Route: ORAL  FLUOXETINE HCL 20 MG CAPSULE (FLUOXETINE HCL) TAKE 1 CAPSULE BY MOUTH EVERY   DAY  FREESTYLE LIBRE 14 DAY SENSOR (CONTINUOUS BLOOD GLUC SENSOR) Use as directe  d to monitor blood sugar .  Change every 14 days,  E 11.9  .  No Changes to Medication List   .  .  Allergies (prior to today's visit):  PENICILLIN V POTASSIUM (PENICILLIN V POTASSIUM) (Critical)  .  .  .  .  .  .  Vitals:   Ht: 64.5 in.  Wt: 228.4 lbs.  BMI (in-lb) 38.74   Temp: 98.6deg F.     BP (Initial [place] Screening): 135 / 88     BP (Rechecked, Actionable): 135 / 8  8 mmHg   Pulse Rate: 80 bpm O2 Sat: 100 %  .  [name]  Additional PE:   General: Well appearing  Head: NCAT  Gait: normal  Eyes-anicteric  Neck: supple  Psych: affect and mood appropriate  .  .  .  Assessment /T/ Plan:   43 year ol dhrer for f/u MMP   .  [name]  DM uncontrolled with retinopathy   a1c 1/20 13  (was really well controlled   in pregnancy, has had financian and emotional contraints) improving sugars   but needs to check more; will check QID x 2 weeks and come back to meet wi  th pharmacy for CGM     on lantus 45 and  humalog k (3-6 units depending on meal)     currently finances are less the issue but have let  her know re: pharmac  y flag programs to help with copays             eye care UTD, next appt 4/20           on  atorvastatin 40 mg ; adherence issue; recheck LDL next time            12/19 microalb neg ;   HTN  but no microalbuminuria and no birth control right now; will keep on h  ydrochlorothiazide 25 mg; consider ACE once birth control reesolved  .  [name]  HLD, ldl 127 but poor adherence; recheck next time   .   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 Monitoring with   eye care   .   depression/reactivity/irritability  - I recommended therapy, has list from   SW, she is to call; on  fluoxetine, would recommend incraesing to 2 /day (  40 mg) she will consider   .  poor sleep--consider sleep study   .  .  .  .  Likely PCOS (advised weight loss)  Likely hidradenitis-no active boils now; use antibacterial and soaks; consi  der cleocin gel if continued  pvax in future   p[facility] at [facility] 2019--will get results; order mammo ; discussed getting   IUD with gyn at [facility]     .  [name]  Orders (  this visit):  RTC in 2 weeks [RTC-014]  RTC in 4 weeks [RTC-028]  Est Level 4 [CPT-99214]  .  Patient Care Plan  .  [name]  Immunization Worksheet 2019   .  .  [name]  Electronically Signed by [name], MD on  12/19/2018 at 5:07 PM  ________________________________________________________________________

## 2018-12-19 NOTE — Progress Notes (Signed)
 Pharmacist Visit  .  [name]  Chief Complaint:   Patient presents to clinic for follow up]for collaborative management of d  iabetes.  Pt is a 43 year old female with PMH significant for: diabetes hyp  ertension, obesity  .  History of Present Illness:   Plan at last visit was:   1. Continue with Lantus 45 units and Humalog 3-6 units with snacks and meal  s for now. Plan to adjust based on blood sugar readings at future visit, su  sepect Humalog will need to be increased.  2. Test blood sugars 4 x/day for Libre  3. Will start to make small dietary changes as discussed.  4. Due for A1c at next visit.  [name]  DIABETES  DM Hx  - Duration:  since /R/44 years old  - Endocrinologist: only when pregnant  .  Current meds: Lantus 45 units daily, Humalog 6 units daily with meals and 3   units with snacks  Previously tried:  .  Complaints of some headches when sugars would be high 280s.   .  Hypoglycemia: When lower than 90, feels clammy, dizzy, aphasia - low as 59    - thinks it is mostly when she skips breakfast, has not dropped below 70 i  n the last 2 weeks. Had juice +/- meal when symptomatic  Meter: [place]  Testing BG: 4x/day  Home BG readings: Fasting 78-123 (avg 100), before lunch 79-214 (avg 132),   dinner 94-201 (avg 144), bedtime 125-191 (avg 167)  .  LIFESTYLE  Diet: daughter's dad brings takeout for dinner several times/week, will hav  e salad /R/1/week. Has started buying broccoli, only has veg a few times/we  ek  .  24 hr recall: L - noodles    D chicken , mac and cheese, broccoli , occassional salad; does have rice    B- graham cracker  snack - oatmeal cookies; cut up veg   Beverages: some juice, but mostly water  Alcohol: 2 drinks/week  Activity/Exercise: minimal  .  MED MANAGEMENT  Does not have assistance managing and taking medications.    Medication cost: No issue, uses FSA card  Medication adherence: no missed doses in the past 2 weeks  .  Medication list and drug allergies reviewed with the patient. Logician,  Dr.   Tiajuana Amass, and ECW med lists reviewed.  Patient is a good historian of medicat  ions and indications. No discrepancies were discovered.  .  .  .  .  .  .  .  .  .  .  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  .  .  Medications (prior to today's visit):  LANTUS 100 UNIT/ML SUBCUTANEOUS SOLUTION (INSULIN GLARGINE) 45 units sq dai  ly  HUMALOG KWIKPEN 100 UNIT/ML  SUBCUTANEOUS SOLUTION PEN-INJECTOR (INSULIN LIS  PRO) Use 6 units before each meal, 3 units with snack; Route: SUBCUTANEOUS  HYDROCHLOROTHIAZIDE 25 MG ORAL TABLET (HYDROCHLOROTHIAZIDE) Take one tablet   by mouth once daily; Route: ORAL  ATORVASTATIN CALCIUM 40 MG ORAL TABLET (ATORVASTATIN CALCIUM) Take one tabl  et by mouth once daily; Route: ORAL      * ACCUCHECK GLUCOMETER check sugars three times daily Dx: Diabetes type   II with retinopathy E11.319      RELION ALL-IN-ONE DEVICE (BLOOD GLUC METER DISP-STRIPS) Use three times   daily (whichever brand covered/with meter)      LANCETS (LANCETS) Use as directed.      INSULIN SYRINGE 30G X 1/2" 0.5 ML (INSULIN SYRINGE-NEEDLE U-100) use  to   inject insulin daily under skin      BD DISP NEEDLES 30G X 1/2" (NEEDLE (DISP)) use small needle to inject i  nsulin      ACCU-CHEK AVIVA IN VITRO STRIP (GLUCOSE BLOOD) use 1-3 times a day   dx   E11 .319; Route: IN VITRO  FLUOXETINE HCL 20 MG CAPSULE (FLUOXETINE HCL) TAKE 1 CAPSULE BY MOUTH EVERY   DAY  FREESTYLE LIBRE 14 DAY SENSOR (CONTINUOUS BLOOD GLUC SENSOR) Use as directe  d to monitor blood sugar .  Change every 14 days,  E 11.9  .  Medications (after today's visit):  LANTUS 100 UNIT/ML SUBCUTANEOUS SOLUTION (INSULIN GLARGINE) 45 units sq dai  ly  HUMALOG KWIKPEN 100 UNIT/ML SUBCUTANEOUS SOLUTION PEN-INJECTOR (INSULIN LIS  PRO) Use 6 units before each meal, 3 units with snack; Route: SUBCUTANEOUS  HYDROCHLOROTHIAZIDE 25 MG ORAL TABLET (HYDROCHLOROTHIAZIDE) Take one tablet   by mouth once daily; Route: ORAL  ATORVASTATIN CALCIUM 40 MG ORAL TABLET (ATORVASTATIN CALCIUM) Take one tabl  et by mouth once daily; Route: ORAL      * ACCUCHECK GLUCOMETER check sugars three times daily Dx: Diabetes type   II with retinopathy E11.319      RELION ALL-IN-ONE DEVICE (BLOOD GLUC METER DISP-STRIPS) Use three times   daily (whichever brand covered/with meter)      LANCETS (LANCETS) Use as directed.      INSULIN SYRINGE 30G X 1/2" 0.5 ML (INSULIN SYRINGE-NEEDLE U-100) use to   inject insulin daily under skin      BD DISP NEEDLES 30G X 1/2" (NEEDLE (DISP)) use small needle to inject i  nsulin      ACCU-CHEK AVIVA IN VITRO STRIP (GLUCOSE BLOOD) use 1-3 times a day   dx   E11 .319; Route: IN VITRO  FLUOXETINE HCL 20 MG CAPSULE (FLUOXETINE HCL) TAKE 1 CAPSULE BY MOUTH EVERY   DAY  FREESTYLE LIBRE 14 DAY SENSOR (CONTINUOUS BLOOD GLUC SENSOR) Use as directe  d to monitor blood sugar .  Change every 14 days,  E 11.9  FREESTYLE LIBRE 14 DAY READER DEVICE (CONTINUOUS BLOOD GLUC RECEIVER) Use d  evice as directed to test blood sugars. E11.9  .  Medications Reviewed:  Done  .  [name]  Allergies (prior to today's visit):  PENICILLIN V  POTASSIUM (PENICILLIN V POTASSIUM) (Critical)  .  Allergies Reviewed:  Done  .  .  .  .  .  Vitals:   Ht: 64.5 in.    .  .  .  .  .  .  Assessment /T/ Plan:   DIABETES  A1c of 13.3% in Dec 2019 at goal of A1c < 7% per ADA guidelines, increased   from 11.8%.  Fasting blood  sugars are at goal, and pre-meal sugars are most  ly at goal but increase throughout the day. Anticipate A1c will be improved   at next check. Patient is experiencing some symptoms of hypoglycemia, but   has improved as she is more accustomed to it. Suspect incidences of hypogly  cemia due to not eating breakfast consitently, discussed importance of cons  istent meals and use of diabetic plate method. Hypoglycemia management revi  ewed. Signficiant improvements in adherence, checking blood sugars and taki  ng Lanuts + Humalog with no missed doses.  .  Plan:   1. Start eating breakfast consistently.   2. Start decreasing carb portion (rice, potatoes), add veg to each meal  3. Carry hard candy or glucose tablets in purse in case of low blood sugar  4. Continue Lantus 45 units daily, Humalog 6 units with meals and 3 units w  ith snacks  5. Test blood sugars 4x/day, and occasionally 2 hrs post meals, Rx for Free  style [place] sent.  6. Due for A1c at f/u visit.  .  MED MANAGEMENT/ADHERENCE  Patient has good adherence. Medication list and allergies reconciled and up  dated based on patient report. Patient education on dosing, indication, sto  rage, side effects, and administration provided.   .  PharmD Follow up in:  1 month w/ Dr [name] on 3/30.  Discussed with and note sent to Dr. [name]   .  .  .  .  .  Electronically Signed by Neta Ehlers, PharmD on 01/06/2019 at 11:25 AM  ________________________________________________________________________

## 2018-12-19 NOTE — Progress Notes (Signed)
 Pharmacist Visit      Chief Complaint:   Patient presents to clinic to establish clinical pharmacy services for for collaborative management of diabetes. Pt is a 43 year old female with PMH significant for: diabetes, hypertension, obesity    Patient goals: wants to get [place]    History of Present Illness:   DIABETES  DM Hx  - Duration:  since ~43 years old  - Endocrinologist: only when pregnant    Current meds: Lantus 42 units (giving 45 units most days) morning, Humalog usually 6 units with meals (also takes 3 units w snack)    Hypoglycemia: happened twice this month; clammy, tired. Only checked BS with one incidence, thinks that one happened because she did not snack the night before. Did not check BS or identify cause with other incidence  Meter: [place]  Testing BG: checks fasting blood sugars a few times/week at most, also goes a few weeks at a time without  Home BG readings: [place]: 2/11 53 drank some apple juice- did not recheck after juice;   1/21 135, 1/20 106, 1/16 96, 1/14 112, 17/ 254, 1/6 321    HYPERTENSION   Current meds: hydrochorlothiazide 25 mg daily  Previously tried: HCTZ-lisinopril, nifedipine (pregnancy)  Home BP: not checking    HYPERLIPIDEMIA/CARDIOVASCULAR PREVENTION  Current meds: atorvastatin 40 mg daily  Side effects: No myalgias    LIFESTYLE  Diet: eats rice for bkfast; last night dinner was french fries and chicken; snacks throughout the day  Beverages: apple juice/ginger ale few times a week, mostly water  Alcohol: 2 drinks/week  Activity/Exercise: minimal    MED MANAGEMENT  Does not have assistance managing and taking medications.  Patient does not use pill box or alarm.  Medication side effects: None   Medication cost: no issues at this time  Medication adherence: reports no missed doses in the past 2 weeks  Barriers to adherence: prioritizes other things above blood sugar checks    Medication list and drug allergies reviewed with the patient. Logician, Dr. Tiajuana Amass, and ECW med lists  reviewed.  Patient is a good historian of medications and indications. 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)  PATELLAR DISLOCATION-MEDIAL RETINACULAR TEAR; RIGHT KNEE (ICD-836.3) (ICD10-S83.006)  OBESITY (ICD-278.00) (ICD10-E66.9)  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)  SEXUALLY TRANSMITTED DISEASE, EXPOSURE TO-H/O GC, TRICH, HPV, CHLAMYDIA (ICD-V01.6) (ICD10-Z20.2)  S/P CHOLECYSTECTOMY (ICD-V45.79) (ICD10-Z90.49)           Medications (prior to today's visit):  LANTUS 100 UNIT/ML SUBCUTANEOUS SOLUTION (INSULIN GLARGINE) 42 units  sq daily  HUMALOG KWIKPEN 100 UNIT/ML SUBCUTANEOUS SOLUTION PEN-INJECTOR (INSULIN LISPRO) Use 3 units before each meal; Route: SUBCUTANEOUS  HYDROCHLOROTHIAZIDE 25 MG ORAL TABLET (HYDROCHLOROTHIAZIDE) Take one tablet by mouth once daily; Route: ORAL  ATORVASTATIN CALCIUM 40 MG ORAL TABLET (ATORVASTATIN CALCIUM) Take one tablet by  mouth once daily; Route: ORAL      * ACCUCHECK GLUCOMETER check sugars three times daily Dx: Diabetes type II with retinopathy E11.319      RELION ALL-IN-ONE DEVICE (BLOOD GLUC METER DISP-STRIPS) Use three times daily (whichever brand covered/with meter)      LANCETS (LANCETS) Use as directed.      INSULIN SYRINGE 30G X 1/2" 0.5 ML (INSULIN SYRINGE-NEEDLE  U-100) use to inject insulin daily under skin      BD DISP NEEDLES 30G X 1/2" (NEEDLE (DISP)) use small needle to inject insulin      ACCU-CHEK AVIVA IN VITRO STRIP (GLUCOSE BLOOD) use 1-3 times a day   dx E11 .319; Route: IN VITRO      IBUPROFEN 800 MG ORAL TABLET (IBUPROFEN) one tablet orally three times daily with meals as needed; Route: ORAL  FLUOXETINE HCL 20 MG CAPSULE (FLUOXETINE HCL) TAKE 1 CAPSULE BY MOUTH EVERY DAY  FREESTYLE LIBRE 14 DAY SENSOR (CONTINUOUS BLOOD GLUC SENSOR) Use as directed to monitor blood sugar .  Change every 14 days,  E 11.9    Medications (after today's visit):  LANTUS 100 UNIT/ML SUBCUTANEOUS SOLUTION (INSULIN GLARGINE) 45 units sq daily  HUMALOG KWIKPEN 100 UNIT/ML SUBCUTANEOUS SOLUTION PEN-INJECTOR (INSULIN LISPRO) Use 6 units before each meal, 3 units with snack; Route: SUBCUTANEOUS  HYDROCHLOROTHIAZIDE 25 MG ORAL TABLET (HYDROCHLOROTHIAZIDE) Take one tablet by mouth once daily; Route: ORAL  ATORVASTATIN CALCIUM 40 MG ORAL TABLET (ATORVASTATIN CALCIUM) Take one tablet by mouth once daily; Route: ORAL      * ACCUCHECK GLUCOMETER check sugars three times daily Dx: Diabetes type II with retinopathy E11.319      RELION ALL-IN-ONE DEVICE (BLOOD GLUC METER DISP-STRIPS) Use three times daily (whichever brand covered/with meter)      LANCETS (LANCETS) Use as directed.      INSULIN SYRINGE 30G X 1/2" 0.5 ML (INSULIN SYRINGE-NEEDLE U-100) use to inject insulin daily under skin      BD DISP NEEDLES 30G X 1/2" (NEEDLE (DISP)) use small needle to inject insulin      ACCU-CHEK AVIVA IN VITRO STRIP (GLUCOSE BLOOD) use 1-3 times a day   dx E11 .319; Route: IN VITRO  FLUOXETINE HCL 20 MG CAPSULE (FLUOXETINE HCL) TAKE 1 CAPSULE BY MOUTH EVERY DAY  FREESTYLE LIBRE 14 DAY SENSOR (CONTINUOUS BLOOD GLUC SENSOR) Use as directed to monitor blood sugar .  Change every 14 days,  E 11.9       Medications Reviewed:  Done      Allergies (prior to today's visit):  PENICILLIN V POTASSIUM (PENICILLIN V  POTASSIUM) (Critical)                 Vitals:   Ht: 64.5 in.                Assessment & Plan:   DIABETES  A1c of 13.3% in Dec 2019 not at goal of A1c < 7% per ADA guidelines, increased from 11.8%. Patient is experiencing some hypoglycemia, hypoglycemia symptoms and management reviewed. Fasting blood sugars are at goal, unable to assess mealtime coverage as she is currently not checking blood sugars throughout the day. Taking Lantus 42-45 units daily and Humalog 6 units with meals without missing doses, but does not take set dose. She is motivated to make changes to her health so she can be there long term for her daughter.     Plan:   1. Continue with Lantus 45 units and Humalog 3-6 units with snacks  and meals for now. Plan to adjust based on blood sugar readings at future visit, susepect Humalog will need to be increased.  2. Test blood sugars 4 x/day for Libre  3. Will start to make small dietary changes as discussed.  4. Due for A1c at next visit.    HYPERTENSION  Blood pressure 133/88 is not at goal of <130/80 per ACC/AHA 2019 guidelines. Patient prescribed hydrochlorothiazide 25 mg daily, taking with good adherence.    Plan:   1. Continue hydrochlorothiazide 25 mg daily. If high on re-check can consider adding lisinopril back on.     HYPERLIPIDEMIA/CARDIOVASCULAR PREVENTION  Patient is on a moderate intensity statin. Patient is on appropriate statin therapy for cardiovascular prevention.     Plan:  1. Continue atorvastatin 40 mg daily.     MED MANAGEMENT/ADHERENCE  Patient has fair adherence. Medication list and allergies reconciled and updated based on patient report. Patient education on dosing, indication, storage, side effects, and administration provided.     Plan: Will buy a pill box and use alarm to help for reminders     PharmD Follow up in: 2-4 weeks as patient willing - will call to set up.   Discussed with Dr. [name]             Created By Neta Ehlers PharmD on 12/19/2018 at 09:22  AM    Electronically Signed By Neta Ehlers PharmD on 12/19/2018 at 10:47 AM

## 2018-12-19 NOTE — Progress Notes (Signed)
Outreach to patient for [employer]   .  [name]  Patient Outreach Program   Spoke with the patient:  NO  Outreach Programs:  LCO, Schedule Visit, and Chronic Care  .  Unable to speak with patient:  Voicemail box full or not set up  Notes: 2021- Risk Contract. Patient is due for Diabetes A1C Screen needed  2021- Risk Contract. Patient is due for a   Visit needed  .  .  .  .  .  Questions for patients; to direct next steps   .  .  .  .  Patient Screening   .  .  .  .  .  .  .  Mandatory In-Person COVID Screening Questions   I am going to ask you a few questions, prior to scheduling your visit.   [If done Pre-visit: Any positive responses, route to the Team RN immediatel  y.]   [If done for [facility] Students: Record results and make Phone Note, then   route to 'Student, NU'.]   1. Do you have any of these new symptoms in the past ten days?   .  2. In the past two weeks, were you within 6 ft of someone   .  3. COVID tested   4. Quarantined   5. Notes or questions from patient (please .sign to complete)   .  .  .  .  .  Electronically Signed by Phillis Knack on 12/17/2019 at 11:28 AM  ________________________________________________________________________

## 2018-12-19 NOTE — Progress Notes (Signed)
 Pharmacist Visit  .  Service Due by Standard Protocol Rules: DIAB EYE EX, HGBA1C or HGBA1C%POC,   PHQ2 SCORE, PAP SMEAR.    .  Initial Screening   * [place]: [place] ([place])  Ht: 64.5 in.  Wt: 226.8 lbs.   BMI: 38.47  with shoes  Temp: 99.3 deg F.     BP (Initial [place] Screening): 134 / 86      BP (Rechecked, Actionable): 134 /   86 mmHg   HR: 96     Health Mgmt materials? Weight Education Handout for high BMI  .  Med List: PRINTED by [facility] for patient   .  Self Mgmt materials provided? Printed handout: Seasonal Allergies  .  [name]  Comments: ........................................Marland KitchenVito Backers  July 23, 2  020 9:08 AM  .  .  .  Chief Complaint:   Patient presents to clinic for follow up for collaborative management of di  abetes with Dr [name].  Pt is a 43 year old female with PMH significant fo  r:   .  History of Present Illness:   Plan at last visit was:   1. Start eating breakfast consistently.   2. Start decreasing carb portion (rice, potatoes), add veg to each meal  3. Carry hard candy or glucose tablets in purse in case of low blood sugar  4. Continue Lantus 45 units daily, Humalog 6 units with meals and 3 units w  ith snacks  5. Test blood sugars 4x/day, and occasionally 2 hrs post meals, Rx for Free  style [place] sent.  .  Was set-back the last few months - family passed d/t covid  DIABETES  DM Hx  - Duration:  since /R/43 years old  - Endocrinologist: only when pregnant  .  Current meds: Lantus 45 units daily (taking 42), Humalog 3 -8 units before   meals, 3 units with snacks -   Previously tried: metformin 10+ years ago, upset stomach  .  Hypoglycemia: no s/sx  Meter: Livongo; Freestyle Libre  Testing BG: 3x/day, restarted recently  Home BG readings:   - Fasting today 200;  180,  160  -lunch 2 hr ppg 280  -hs 195  .  LIFESTYLE  Diet: eats out 3x/week  24 hr recall:    D - cheeseburger and fries, no soda   B- bacon and egg on wheat  snack - doritos, ice cream, oreos  Beverages: soda 2x/week, but mostly water  Alcohol:  2 drinks/week  Activity/Exercise: minimal, not walking  .  (continued):   MED MANAGEMENT  Does not have assistance managing and taking medications.    Medication cost: No issue, uses FSA card  Medication adherence: No missed doses of DM meds, not taking fluoxetine, st  opped in March  .  Medication list and drug allergies reviewed with the patient. Logician, Dr.   Tiajuana Amass, and [facility] med lists reviewed.  Patient is a good historian of medicat  ions and indications. Not taking fluxoetine, removed from med list as she d  oes not want to resume at this time.  .  .  .  .  .  .  .  .  .  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  .  .  Medications (prior to today's visit):  LANTUS 100 UNIT/ML SUBCUTANEOUS SOLUTION (INSULIN GLARGINE) 45 units sq dai  ly  HUMALOG 100 UNITS/ML KWIKPEN (INSULIN LISPRO) USE 3 UNITS BEFORE EACH MEAL  HYDROCHLOROTHIAZIDE 25 MG ORAL TABLET (HYDROCHLOROTHIAZIDE) Take one tablet   by mouth once daily; Route: ORAL  ATORVASTATIN CALCIUM 40 MG ORAL TABLET (ATORVASTATIN CALCIUM) Take one tabl  et by mouth once daily; Route: ORAL       * ACCUCHECK GLUCOMETER check sugars three times daily Dx: Diabetes type   II with retinopathy E11.319      RELION ALL-IN-ONE DEVICE (BLOOD GLUC METER DISP-STRIPS) Use three times   daily (whichever brand covered/with meter)      LANCETS (LANCETS) Use as directed.      INSULIN SYRINGE 30G X 1/2" 0.5 ML (INSULIN SYRINGE-NEEDLE U-100) use to   inject insulin daily under skin      BD DISP NEEDLES 30G X 1/2" (NEEDLE (DISP)) use small needle to inject i  nsulin      ACCU-CHEK AVIVA IN VITRO STRIP (GLUCOSE BLOOD) use 1-3 times a day   dx   E11 .319; Route: IN VITRO  FLUOXETINE HCL 20 MG CAPSULE (FLUOXETINE HCL) TAKE 1 CAPSULE BY MOUTH EVERY   DAY  FREESTYLE LIBRE 14 DAY SENSOR (CONTINUOUS BLOOD GLUC SENSOR) Use as directe  d to monitor blood sugar .  Change every 14 days,  E 11.9  FREESTYLE LIBRE 14 DAY READER DEVICE (CONTINUOUS BLOOD GLUC RECEIVER) Use d  evice as directed to test blood sugars. E11.9  .  Medications (after today's visit):  LANTUS 100 UNIT/ML SUBCUTANEOUS SOLUTION (INSULIN GLARGINE) 45 units sq dai  ly  HUMALOG 100 UNITS/ML KWIKPEN (INSULIN LISPRO) USE 3 UNITS BEFORE EACH MEAL  HYDROCHLOROTHIAZIDE 25 MG ORAL TABLET (HYDROCHLOROTHIAZIDE) Take one tablet   by mouth once daily; Route: ORAL  ATORVASTATIN CALCIUM 40 MG ORAL TABLET (ATORVASTATIN CALCIUM) Take one tabl  et by mouth once daily; Route: ORAL      * ACCUCHECK GLUCOMETER check sugars three times daily Dx: Diabetes type   II with retinopathy E11.319      RELION ALL-IN-ONE DEVICE (BLOOD GLUC METER DISP-STRIPS) Use three times   daily (whichever brand covered/with meter)      LANCETS (LANCETS) Use as directed.      INSULIN SYRINGE 30G X 1/2" 0.5 ML (INSULIN SYRINGE-NEEDLE U-100) use to   inject insulin daily under skin      BD DISP NEEDLES 30G X 1/2" (NEEDLE (DISP)) use small needle to inject i  nsulin      ACCU-CHEK AVIVA IN VITRO STRIP (GLUCOSE BLOOD) use 1-3 times a day   dx   E11 .319; Route: IN VITRO  FREESTYLE LIBRE 14 DAY SENSOR (CONTINUOUS BLOOD  GLUC SENSOR) Use as directe  d to monitor blood sugar .  Change every 14 days,  E 11.9  FREESTYLE LIBRE 14 DAY READER DEVICE (CONTINUOUS BLOOD GLUC RECEIVER) Use d  evice as directed to test blood sugars. E11.9  .  Medications Reviewed:  Done  .  .  Allergies (prior to today's visit):  PENICILLIN V POTASSIUM (PENICILLIN V POTASSIUM) (Critical)  .  Allergies (changes):  Changed allergy or adverse reaction from PENICILLIN V POTASSIUM (PENICILLIN   V POTASSIUM) (Critical) to PENICILLIN V POTASSIUM (PENICILLIN V POTASSIUM)   (Severe) - Signed  Allergies Reviewed:  Done  .  .  .  .  .  Vitals:   Ht: 64.5 in.  Wt: 226.8 lbs.  BMI (in-lb) 38.47  Temp: 99.3deg F.     BP (Initial [place] Screening): 134 / 86     BP (Rechecked, Actionable): 134 / 8  6 mmHg   Pulse Rate: 96 bpm  .  .  .  .  Assessment /T/ Plan:   DIABETES  A1c of 13.3% in Dec 2019 at goal of A1c < 7% per ADA guidelines, increased   from 11.8%.  Limited fasting and post-prandial blood sugars have been eleva  ted and no longer at goal. Had set backs the past few months in diabetes [place]  nagement, she is disheartened and embarassed by her lack of progress. Comme  nded her for coming back, she is willing to make lifestyle changes again. B  ack to checking blood sugars, and is now taking Lanuts + Humalog with no mi  ssed doses. Counseled and watched patient place [employer] today, sync  ed to [employer], counseled to still use glucometer for lows. Per patient re  port, metformin was tried in the past but had stomach issues, unclear if IR   or ER - willing to try again if Dr. [name] agrees.  .  Plan:   1. Patient set goals of Max of eating out 1x/week, and walk 2x/week, at lea  st 1 hr   2. Resume Lantus 45 units daily, Humalog 6 units with meals and 3 units wit  h snacks  3. Encouraged her to check blood sugars fasting and before each meal, and q  8hrs.  4. Due for A1c and BMP at f/u visit.  5. Consider restarting metformin  .  MED MANAGEMENT/ADHERENCE  Patient has  good adherence. Medication list and allergies reconciled and up  dated based on patient report. Patient education on dosing, indication, sto  rage, side effects, and administration provided. BP above goal of 130/80, w  ill recheck next visit.  [name]  PharmD Follow up in:  3 weeks, 8/13  Booked Dr [name] appt 9/10   .  [name]  Hi Linde Gillis got re-engaged today!   1. She did stop taking her fluoxetine in March and does not want to resume,   will remove from med list if ok with you.   2. Thoughts on restarting metformin next visit?    3. Can you also send for A1c and BMP for her to get done next visit?   Thanks! ......................................Marland KitchenNeta Ehlers, PharmD  May 29, 2019 1:02 PM  .  Yes can take fluoxetine off med list  Agree with metformin, I'm so glad she is back  Yes, I hope she comes and will definitely send at next visit ..............  .........................[name], MD  May 29, 2019 2:02 PM  .  .  .  Electronically Signed by Neta Ehlers, PharmD on 05/29/2019 at 2:41 PM  ________________________________________________________________________

## 2018-12-20 ENCOUNTER — Ambulatory Visit

## 2018-12-20 NOTE — Telephone Encounter (Signed)
 Call Details:   Tried to call patient to schedule f/u pharmacist visit for diabetes. LVM, will try again next week.     RESPONSE/ORDERS:    Called and LVM for patient to schedule f/u pharmacist visit. Will try again by 2/21. 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) (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)  HYPERTENSION (ICD-401.9) (ICD10-I10)  ANEMIA (ICD-285.9) (ICD10-D64.9)  HIDRADENITIS (ICD-705.83) (ICD10-L73.2)  SHORTNESS OF BREATH (ICD-786.05) (ICD10-R06.02)  ANNUAL EXAM (ICD-V72.31) (ICD10-Z00.00)  DEPRESSION (ICD-311) (ICD10-F32.9)      ANXIETY (ICD-300.00) (ICD10-F41.9)  ABNORMAL PAP SMEAR 12/11 LSIL; COLPO 12/11; BX CIN I; 6/12 ASCUS PAP; 4/13 PAP NEG; HR HPV POS, NEG COLPO; 5/14 PAP/HR HPV NEG (ICD-795.00) (ICD10-R87.619)  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)  PATELLAR DISLOCATION-MEDIAL RETINACULAR TEAR; RIGHT KNEE (ICD-836.3) (ICD10-S83.006)  OBESITY (ICD-278.00) (ICD10-E66.9)  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)  SEXUALLY TRANSMITTED DISEASE, EXPOSURE TO-H/O GC, TRICH, HPV, CHLAMYDIA (ICD-V01.6) (ICD10-Z20.2)  S/P CHOLECYSTECTOMY (ICD-V45.79) (ICD10-Z90.49)    Allergies:   PENICILLIN V POTASSIUM (PENICILLIN V POTASSIUM) (Critical)    Meds (prior to this call):   LANTUS 100 UNIT/ML SUBCUTANEOUS SOLUTION (INSULIN GLARGINE) 45 units sq daily  HUMALOG KWIKPEN 100 UNIT/ML SUBCUTANEOUS SOLUTION PEN-INJECTOR (INSULIN LISPRO) Use 6 units before each meal, 3 units with snack; Route: SUBCUTANEOUS  HYDROCHLOROTHIAZIDE 25 MG ORAL TABLET (HYDROCHLOROTHIAZIDE) Take one tablet by mouth once daily; Route: ORAL  ATORVASTATIN CALCIUM 40 MG ORAL TABLET (ATORVASTATIN CALCIUM) Take one tablet by mouth once daily; Route: ORAL      * ACCUCHECK GLUCOMETER check sugars three times daily Dx: Diabetes type II with retinopathy E11.319      RELION ALL-IN-ONE DEVICE (BLOOD GLUC  METER DISP-STRIPS) Use three times daily (whichever brand covered/with meter)      LANCETS (LANCETS) Use as directed.      INSULIN SYRINGE 30G X 1/2" 0.5 ML (INSULIN SYRINGE-NEEDLE U-100) use to inject insulin daily under skin      BD DISP NEEDLES 30G X 1/2" (NEEDLE (DISP)) use small needle to inject insulin      ACCU-CHEK AVIVA IN VITRO STRIP (GLUCOSE BLOOD) use 1-3 times a day   dx E11 .319; Route: IN VITRO  FLUOXETINE HCL 20 MG CAPSULE (FLUOXETINE HCL) TAKE 1 CAPSULE BY MOUTH EVERY DAY  FREESTYLE LIBRE 14 DAY SENSOR (CONTINUOUS BLOOD GLUC SENSOR) Use as directed to monitor blood sugar .  Change every 14 days,  E 11.9            Created By Neta Ehlers PharmD on 12/20/2018 at 04:19 PM    Electronically Signed By Neta Ehlers PharmD on 12/24/2018 at 01:49 PM

## 2018-12-24 ENCOUNTER — Ambulatory Visit

## 2018-12-24 NOTE — Consults (Signed)
 Pharmacy Referral     Reason for Referral (Check all that apply):  Diabetes        Last A1C Date: 04/29/2018  A1C:11.8  Last BP Date: 12/19/2018  BP: 135/88    Narrative: med management    Scheduled for Mon 3/2 at 9 AM. ......................................Marland KitchenNeta Ehlers, PharmD  December 30, 2018 10:32 AM        Created By [name] MD on 12/24/2018 at 12:10 PM    Electronically Signed By Neta Ehlers PharmD on 01/06/2019 at 11:25 AM

## 2019-01-06 ENCOUNTER — Ambulatory Visit

## 2019-01-06 MED ORDER — CONTINUOUS BLOOD GLUC RECEIVER: 1 | 0 refills | 0 days | Status: AC

## 2019-01-06 MED ORDER — CONTINUOUS BLOOD GLUC SENSOR: 3 | 3 refills | 0 days | Status: AC

## 2019-01-06 NOTE — Progress Notes (Signed)
 Pharmacist Visit      Chief Complaint:   Patient presents to clinic for follow up] for collaborative management of diabetes.  Pt is a 43 year old female with PMH significant for: diabetes hypertension, obesity    History of Present Illness:   Plan at last visit was:   1. Continue with Lantus 45 units and Humalog 3-6 units with snacks and meals for now. Plan to adjust based on blood sugar readings at future visit, susepect Humalog will need to be increased.  2. Test blood sugars 4 x/day for Libre  3. Will start to make small dietary changes as discussed.  4. Due for A1c at next visit.    DIABETES  DM Hx  - Duration:  since ~43 years old  - Endocrinologist: only when pregnant    Current meds: Lantus 45 units daily, Humalog 6 units daily with meals and 3 units with snacks  Previously tried:    Complaints of some headches when sugars would be high 280s.     Hypoglycemia: When lower than 90, feels clammy, dizzy, aphasia - low as 62  - thinks it is mostly when she skips breakfast, has not dropped below 70 in the last 2 weeks. Had juice +/- meal when symptomatic  Meter: Livongo  Testing BG: 4x/day  Home BG readings: Fasting 78-123 (avg 100), before lunch 79-214 (avg 132), dinner 94-201 (avg 144), bedtime 125-191 (avg 167)    LIFESTYLE  Diet: daughter's dad brings takeout for dinner several times/week, will have salad ~1/week. Has started buying broccoli, only has veg a few times/week    24 hr recall: L - noodles    D chicken , mac and cheese, broccoli , occassional salad; does have rice    B- graham cracker  snack - oatmeal cookies; cut up veg   Beverages: some juice, but mostly water  Alcohol: 2 drinks/week  Activity/Exercise: minimal    MED MANAGEMENT  Does not have assistance managing and taking medications.    Medication cost: No issue, uses FSA card  Medication adherence: no missed doses in the past 2 weeks    Medication list and drug allergies reviewed with the patient. Logician, Dr. Tiajuana Amass, and [facility] med lists  reviewed.  Patient is a good historian of medications and indications. No discrepancies were discovered.                        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)  PATELLAR DISLOCATION-MEDIAL RETINACULAR TEAR; RIGHT KNEE (ICD-836.3) (ICD10-S83.006)  OBESITY (ICD-278.00) (ICD10-E66.9)  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)  SEXUALLY TRANSMITTED DISEASE, EXPOSURE TO-H/O GC, TRICH, HPV, CHLAMYDIA (ICD-V01.6) (ICD10-Z20.2)  S/P CHOLECYSTECTOMY (ICD-V45.79) (ICD10-Z90.49)           Medications (prior to today's visit):  LANTUS 100 UNIT/ML SUBCUTANEOUS SOLUTION (INSULIN GLARGINE) 45 units sq daily  HUMALOG KWIKPEN 100 UNIT/ML SUBCUTANEOUS SOLUTION PEN-INJECTOR (INSULIN LISPRO) Use 6 units before each meal, 3 units with snack; Route: SUBCUTANEOUS  HYDROCHLOROTHIAZIDE 25 MG ORAL TABLET (HYDROCHLOROTHIAZIDE) Take one tablet by mouth once  daily; Route: ORAL  ATORVASTATIN CALCIUM 40 MG ORAL TABLET (ATORVASTATIN CALCIUM) Take one tablet by mouth once daily; Route: ORAL      * ACCUCHECK GLUCOMETER check sugars three times daily Dx: Diabetes type II with retinopathy E11.319      RELION ALL-IN-ONE DEVICE (BLOOD GLUC METER DISP-STRIPS) Use three times daily (whichever brand covered/with meter)      LANCETS (LANCETS) Use as directed.      INSULIN SYRINGE 30G X 1/2" 0.5 ML (INSULIN SYRINGE-NEEDLE U-100) use to inject insulin daily under skin      BD DISP NEEDLES 30G X  1/2" (NEEDLE (DISP)) use small needle to inject insulin      ACCU-CHEK AVIVA IN VITRO STRIP (GLUCOSE BLOOD) use 1-3 times a day   dx E11 .319; Route: IN VITRO  FLUOXETINE HCL 20 MG CAPSULE (FLUOXETINE HCL) TAKE 1 CAPSULE BY MOUTH EVERY DAY  FREESTYLE LIBRE 14 DAY SENSOR (CONTINUOUS BLOOD GLUC SENSOR) Use as directed to monitor blood sugar .  Change every 14 days,  E 11.9    Medications (after today's visit):  LANTUS 100 UNIT/ML SUBCUTANEOUS SOLUTION (INSULIN GLARGINE) 45 units sq daily  HUMALOG KWIKPEN 100 UNIT/ML SUBCUTANEOUS SOLUTION PEN-INJECTOR (INSULIN LISPRO) Use 6 units before each meal, 3 units with snack; Route: SUBCUTANEOUS  HYDROCHLOROTHIAZIDE 25 MG ORAL TABLET (HYDROCHLOROTHIAZIDE) Take one tablet by mouth once daily; Route: ORAL  ATORVASTATIN CALCIUM 40 MG ORAL TABLET (ATORVASTATIN CALCIUM) Take one tablet by mouth once daily; Route: ORAL      * ACCUCHECK GLUCOMETER check sugars three times daily Dx: Diabetes type II with retinopathy E11.319      RELION ALL-IN-ONE DEVICE (BLOOD GLUC METER DISP-STRIPS) Use three times daily (whichever brand covered/with meter)      LANCETS (LANCETS) Use as directed.      INSULIN SYRINGE 30G X 1/2" 0.5 ML (INSULIN SYRINGE-NEEDLE U-100) use to inject insulin daily under skin      BD DISP NEEDLES 30G X 1/2" (NEEDLE (DISP)) use small needle to inject insulin      ACCU-CHEK AVIVA IN VITRO STRIP (GLUCOSE BLOOD) use 1-3 times a day   dx E11 .319; Route: IN VITRO  FLUOXETINE HCL 20 MG CAPSULE (FLUOXETINE HCL) TAKE 1 CAPSULE BY MOUTH EVERY DAY  FREESTYLE LIBRE 14 DAY SENSOR (CONTINUOUS BLOOD GLUC SENSOR) Use as directed to monitor blood sugar .  Change every 14 days,  E 11.9  FREESTYLE LIBRE 14 DAY READER DEVICE (CONTINUOUS BLOOD GLUC RECEIVER) Use device as directed to test blood sugars. E11.9       Medications Reviewed:  Done      Allergies (prior to today's visit):  PENICILLIN V POTASSIUM (PENICILLIN V POTASSIUM) (Critical)       Allergies Reviewed:  Done            Vitals:    Ht: 64.5 in.                Assessment & Plan:   DIABETES  A1c of 13.3% in Dec 2019 at goal of A1c < 7% per ADA guidelines, increased from 11.8%.  Fasting blood sugars are at goal, and pre-meal sugars are mostly at goal but increase throughout the day. Anticipate A1c will be improved at next check. Patient is experiencing some symptoms of hypoglycemia, but has improved as she is more accustomed to it. Suspect incidences of hypoglycemia due to not eating breakfast consitently, discussed importance of consistent meals and use of diabetic plate method. Hypoglycemia management reviewed. Signficiant improvements in adherence, checking  blood sugars and taking Lanuts + Humalog with no missed doses.    Plan:   1. Start eating breakfast consistently.   2. Start decreasing carb portion (rice, potatoes), add veg to each meal  3. Carry hard candy or glucose tablets in purse in case of low blood sugar  4. Continue Lantus 45 units daily, Humalog 6 units with meals and 3 units with snacks  5. Test blood sugars 4x/day, and occasionally 2 hrs post meals, Rx for [employer] sent.  6. Due for A1c at f/u visit.    MED MANAGEMENT/ADHERENCE  Patient has good adherence. Medication list and allergies reconciled and updated based on patient report. Patient education on dosing, indication, storage, side effects, and administration provided.     PharmD Follow up in:  1 month w/ Dr [name] on 3/30.  Discussed with and note sent to Dr. [name]             Created By Neta Ehlers PharmD on 01/06/2019 at 09:13 AM    Electronically Signed By Neta Ehlers PharmD on 01/06/2019 at 11:25 AM

## 2019-02-03 ENCOUNTER — Ambulatory Visit

## 2019-02-03 NOTE — Telephone Encounter (Signed)
 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) (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)  HYPERTENSION (ICD-401.9) (ICD10-I10)  ANEMIA (ICD-285.9) (ICD10-D64.9)  HIDRADENITIS (ICD-705.83) (ICD10-L73.2)  SHORTNESS OF BREATH (ICD-786.05) (ICD10-R06.02)  ANNUAL EXAM (ICD-V72.31) (ICD10-Z00.00)  DEPRESSION (ICD-311) (ICD10-F32.9)      ANXIETY (ICD-300.00) (ICD10-F41.9)  ABNORMAL PAP SMEAR 12/11 LSIL; COLPO 12/11; BX CIN I; 6/12 ASCUS PAP; 4/13 PAP NEG; HR HPV POS, NEG COLPO; 5/14 PAP/HR HPV NEG (ICD-795.00) (ICD10-R87.619)  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)  PATELLAR DISLOCATION-MEDIAL RETINACULAR TEAR; RIGHT KNEE (ICD-836.3) (ICD10-S83.006)  OBESITY (ICD-278.00) (ICD10-E66.9)  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)  SEXUALLY TRANSMITTED DISEASE, EXPOSURE TO-H/O GC, TRICH, HPV, CHLAMYDIA (ICD-V01.6) (ICD10-Z20.2)  S/P CHOLECYSTECTOMY (ICD-V45.79) (ICD10-Z90.49)    Allergies:   PENICILLIN V POTASSIUM (PENICILLIN V POTASSIUM) (Critical)    Meds (prior to this call):   LANTUS 100 UNIT/ML SUBCUTANEOUS SOLUTION (INSULIN GLARGINE) 45 units sq daily  HUMALOG KWIKPEN 100 UNIT/ML SUBCUTANEOUS SOLUTION PEN-INJECTOR (INSULIN LISPRO) Use 6 units before each meal, 3 units with snack; Route: SUBCUTANEOUS  HYDROCHLOROTHIAZIDE 25 MG ORAL TABLET (HYDROCHLOROTHIAZIDE) Take one tablet by mouth once daily; Route: ORAL  ATORVASTATIN CALCIUM 40 MG ORAL TABLET (ATORVASTATIN CALCIUM) Take one tablet by mouth once daily; Route: ORAL      * ACCUCHECK GLUCOMETER check sugars three times daily Dx: Diabetes type II with retinopathy E11.319      RELION ALL-IN-ONE DEVICE (BLOOD GLUC METER DISP-STRIPS) Use three times daily (whichever brand covered/with meter)      LANCETS  (LANCETS) Use as directed.      INSULIN SYRINGE 30G X 1/2" 0.5 ML (INSULIN SYRINGE-NEEDLE U-100) use to inject insulin daily under skin      BD DISP NEEDLES 30G X 1/2" (NEEDLE (DISP)) use small needle to inject insulin      ACCU-CHEK AVIVA IN VITRO STRIP (GLUCOSE BLOOD) use 1-3 times a day   dx E11 .319; Route: IN VITRO  FLUOXETINE HCL 20 MG CAPSULE (FLUOXETINE HCL) TAKE 1 CAPSULE BY MOUTH EVERY DAY  FREESTYLE LIBRE 14 DAY SENSOR (CONTINUOUS BLOOD GLUC SENSOR) Use as directed to monitor blood sugar .  Change every 14 days,  E 11.9  FREESTYLE LIBRE 14 DAY READER DEVICE (CONTINUOUS BLOOD GLUC RECEIVER) Use device as directed to test blood sugars. E11.9            Created By [name] MD on 02/03/2019 at 09:27 AM    Electronically Signed By [name] MD on 02/03/2019 at 09:27 AM

## 2019-02-06 ENCOUNTER — Ambulatory Visit

## 2019-02-06 NOTE — Telephone Encounter (Signed)
 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)  HYPERTENSION (ICD-401.9) (ICD10-I10)  ANEMIA (ICD-285.9) (ICD10-D64.9)  HIDRADENITIS (ICD-705.83) (ICD10-L73.2)  SHORTNESS OF BREATH (ICD-786.05) (ICD10-R06.02)  ANNUAL EXAM (ICD-V72.31) (ICD10-Z00.00)  DEPRESSION (ICD-311) (ICD10-F32.9)      ANXIETY (ICD-300.00) (ICD10-F41.9)  ABNORMAL PAP SMEAR 12/11 LSIL; COLPO 12/11; BX CIN I; 6/12 ASCUS PAP; 4/13 PAP NEG; HR HPV POS, NEG COLPO; 5/14 PAP/HR HPV NEG (ICD-795.00) (ICD10-R87.619)  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)  PATELLAR DISLOCATION-MEDIAL RETINACULAR TEAR; RIGHT KNEE (ICD-836.3) (ICD10-S83.006)  OBESITY (ICD-278.00) (ICD10-E66.9)  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)  SEXUALLY TRANSMITTED DISEASE, EXPOSURE TO-H/O GC, TRICH, HPV, CHLAMYDIA (ICD-V01.6) (ICD10-Z20.2)  S/P CHOLECYSTECTOMY (ICD-V45.79) (ICD10-Z90.49)    Allergies:   PENICILLIN V POTASSIUM (PENICILLIN V POTASSIUM) (Critical)    Meds (prior to this call):   LANTUS 100 UNIT/ML SUBCUTANEOUS SOLUTION (INSULIN GLARGINE) 45 units sq daily  HUMALOG KWIKPEN 100 UNIT/ML SUBCUTANEOUS SOLUTION PEN-INJECTOR (INSULIN LISPRO) Use 6 units before each meal, 3 units with snack; Route: SUBCUTANEOUS  HYDROCHLOROTHIAZIDE 25 MG ORAL TABLET (HYDROCHLOROTHIAZIDE) Take one tablet by mouth once daily; Route: ORAL  ATORVASTATIN CALCIUM 40 MG ORAL TABLET (ATORVASTATIN CALCIUM) Take one tablet by mouth once daily; Route: ORAL      * ACCUCHECK GLUCOMETER check sugars three times daily Dx: Diabetes type II with retinopathy E11.319      RELION ALL-IN-ONE DEVICE (BLOOD GLUC METER DISP-STRIPS) Use three times daily (whichever brand covered/with meter)      LANCETS (LANCETS) Use as directed.      INSULIN SYRINGE 30G X 1/2" 0.5 ML  (INSULIN SYRINGE-NEEDLE U-100) use to inject insulin daily under skin      BD DISP NEEDLES 30G X 1/2" (NEEDLE (DISP)) use small needle to inject insulin      ACCU-CHEK AVIVA IN VITRO STRIP (GLUCOSE BLOOD) use 1-3 times a day   dx E11 .319; Route: IN VITRO  FLUOXETINE HCL 20 MG CAPSULE (FLUOXETINE HCL) TAKE 1 CAPSULE BY MOUTH EVERY DAY  FREESTYLE LIBRE 14 DAY SENSOR (CONTINUOUS BLOOD GLUC SENSOR) Use as directed to monitor blood sugar .  Change every 14 days,  E 11.9  FREESTYLE LIBRE 14 DAY READER DEVICE (CONTINUOUS BLOOD GLUC RECEIVER) Use device as directed to test blood sugars. E11.9            Created By [name] MD on 02/06/2019 at 10:23 AM    Electronically Signed By [name] MD on 02/06/2019 at 10:23 AM

## 2019-02-09 ENCOUNTER — Ambulatory Visit

## 2019-02-11 ENCOUNTER — Ambulatory Visit

## 2019-02-11 NOTE — Telephone Encounter (Signed)
 RESPONSE/ORDERS:    Tried to call to schedule video visit re diabetes managment, LVM and call back number. Asked her to also reschedule missed appt with Dr. [name]. 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)  HYPERTENSION (ICD-401.9) (ICD10-I10)  ANEMIA (ICD-285.9) (ICD10-D64.9)  HIDRADENITIS (ICD-705.83) (ICD10-L73.2)  SHORTNESS OF BREATH (ICD-786.05) (ICD10-R06.02)  ANNUAL EXAM (ICD-V72.31) (ICD10-Z00.00)  DEPRESSION (ICD-311) (ICD10-F32.9)      ANXIETY (ICD-300.00) (ICD10-F41.9)  ABNORMAL PAP SMEAR 12/11 LSIL; COLPO 12/11; BX CIN I; 6/12 ASCUS PAP; 4/13 PAP NEG; HR HPV POS, NEG COLPO; 5/14 PAP/HR HPV NEG (ICD-795.00) (ICD10-R87.619)  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)  PATELLAR DISLOCATION-MEDIAL RETINACULAR TEAR; RIGHT KNEE (ICD-836.3) (ICD10-S83.006)  OBESITY (ICD-278.00) (ICD10-E66.9)  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)  SEXUALLY TRANSMITTED DISEASE, EXPOSURE TO-H/O GC, TRICH, HPV, CHLAMYDIA (ICD-V01.6) (ICD10-Z20.2)  S/P CHOLECYSTECTOMY (ICD-V45.79) (ICD10-Z90.49)    Allergies:   PENICILLIN V POTASSIUM (PENICILLIN V POTASSIUM) (Critical)    Meds (prior to this call):   LANTUS 100 UNIT/ML SUBCUTANEOUS SOLUTION (INSULIN GLARGINE) 45 units sq daily  HUMALOG KWIKPEN 100 UNIT/ML SUBCUTANEOUS SOLUTION PEN-INJECTOR (INSULIN LISPRO) Use 6 units before each meal, 3 units with snack; Route: SUBCUTANEOUS  HYDROCHLOROTHIAZIDE 25 MG ORAL TABLET (HYDROCHLOROTHIAZIDE) Take one tablet by mouth once daily; Route: ORAL  ATORVASTATIN CALCIUM 40 MG ORAL TABLET (ATORVASTATIN CALCIUM) Take one tablet by mouth once daily; Route: ORAL      * ACCUCHECK GLUCOMETER check sugars three times daily Dx: Diabetes type II with retinopathy E11.319      RELION ALL-IN-ONE DEVICE (BLOOD GLUC METER DISP-STRIPS) Use three times daily (whichever brand  covered/with meter)      LANCETS (LANCETS) Use as directed.      INSULIN SYRINGE 30G X 1/2" 0.5 ML (INSULIN SYRINGE-NEEDLE U-100) use to inject insulin daily under skin      BD DISP NEEDLES 30G X 1/2" (NEEDLE (DISP)) use small needle to inject insulin      ACCU-CHEK AVIVA IN VITRO STRIP (GLUCOSE BLOOD) use 1-3 times a day   dx E11 .319; Route: IN VITRO  FLUOXETINE HCL 20 MG CAPSULE (FLUOXETINE HCL) TAKE 1 CAPSULE BY MOUTH EVERY DAY  FREESTYLE LIBRE 14 DAY SENSOR (CONTINUOUS BLOOD GLUC SENSOR) Use as directed to monitor blood sugar .  Change every 14 days,  E 11.9  FREESTYLE LIBRE 14 DAY READER DEVICE (CONTINUOUS BLOOD GLUC RECEIVER) Use device as directed to test blood sugars. E11.9            Created By Neta Ehlers PharmD on 02/11/2019 at 11:52 AM    Electronically Signed By Neta Ehlers PharmD on 02/11/2019 at 12:08 PM

## 2019-02-27 ENCOUNTER — Ambulatory Visit

## 2019-03-19 ENCOUNTER — Ambulatory Visit

## 2019-03-19 NOTE — Telephone Encounter (Signed)
 RESPONSE/ORDERS:    Called and LVM for patient to reschedule missed PharmD visit regarding diabetes. Call back number provided, will try again next week. 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)  HYPERTENSION (ICD-401.9) (ICD10-I10)  ANEMIA (ICD-285.9) (ICD10-D64.9)  HIDRADENITIS (ICD-705.83) (ICD10-L73.2)  SHORTNESS OF BREATH (ICD-786.05) (ICD10-R06.02)  ANNUAL EXAM (ICD-V72.31) (ICD10-Z00.00)  DEPRESSION (ICD-311) (ICD10-F32.9)      ANXIETY (ICD-300.00) (ICD10-F41.9)  ABNORMAL PAP SMEAR 12/11 LSIL; COLPO 12/11; BX CIN I; 6/12 ASCUS PAP; 4/13 PAP NEG; HR HPV POS, NEG COLPO; 5/14 PAP/HR HPV NEG (ICD-795.00) (ICD10-R87.619)  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)  PATELLAR DISLOCATION-MEDIAL RETINACULAR TEAR; RIGHT KNEE (ICD-836.3) (ICD10-S83.006)  OBESITY (ICD-278.00) (ICD10-E66.9)  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)  SEXUALLY TRANSMITTED DISEASE, EXPOSURE TO-H/O GC, TRICH, HPV, CHLAMYDIA (ICD-V01.6) (ICD10-Z20.2)  S/P CHOLECYSTECTOMY (ICD-V45.79) (ICD10-Z90.49)    Allergies:   PENICILLIN V POTASSIUM (PENICILLIN V POTASSIUM) (Critical)    Meds (prior to this call):   LANTUS 100 UNIT/ML SUBCUTANEOUS SOLUTION (INSULIN GLARGINE) 45 units sq daily  HUMALOG KWIKPEN 100 UNIT/ML SUBCUTANEOUS SOLUTION PEN-INJECTOR (INSULIN LISPRO) Use 6 units before each meal, 3 units with snack; Route: SUBCUTANEOUS  HYDROCHLOROTHIAZIDE 25 MG ORAL TABLET (HYDROCHLOROTHIAZIDE) Take one tablet by mouth once daily; Route: ORAL  ATORVASTATIN CALCIUM 40 MG ORAL TABLET (ATORVASTATIN CALCIUM) Take one tablet by mouth once daily; Route: ORAL      * ACCUCHECK GLUCOMETER check sugars three times daily Dx: Diabetes type II with retinopathy E11.319      RELION ALL-IN-ONE DEVICE (BLOOD GLUC METER DISP-STRIPS) Use three times daily (whichever brand covered/with meter)       LANCETS (LANCETS) Use as directed.      INSULIN SYRINGE 30G X 1/2" 0.5 ML (INSULIN SYRINGE-NEEDLE U-100) use to inject insulin daily under skin      BD DISP NEEDLES 30G X 1/2" (NEEDLE (DISP)) use small needle to inject insulin      ACCU-CHEK AVIVA IN VITRO STRIP (GLUCOSE BLOOD) use 1-3 times a day   dx E11 .319; Route: IN VITRO  FLUOXETINE HCL 20 MG CAPSULE (FLUOXETINE HCL) TAKE 1 CAPSULE BY MOUTH EVERY DAY  FREESTYLE LIBRE 14 DAY SENSOR (CONTINUOUS BLOOD GLUC SENSOR) Use as directed to monitor blood sugar .  Change every 14 days,  E 11.9  FREESTYLE LIBRE 14 DAY READER DEVICE (CONTINUOUS BLOOD GLUC RECEIVER) Use device as directed to test blood sugars. E11.9            Created By Neta Ehlers PharmD on 03/19/2019 at 11:31 AM    Electronically Signed By Neta Ehlers PharmD on 03/19/2019 at 11:32 AM

## 2019-03-27 ENCOUNTER — Ambulatory Visit

## 2019-03-27 NOTE — Telephone Encounter (Signed)
 RESPONSE/ORDERS:    Called to check in on blood sugars, phone goes directly to VM. LVM and call back number. 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)  HYPERTENSION (ICD-401.9) (ICD10-I10)  ANEMIA (ICD-285.9) (ICD10-D64.9)  HIDRADENITIS (ICD-705.83) (ICD10-L73.2)  SHORTNESS OF BREATH (ICD-786.05) (ICD10-R06.02)  ANNUAL EXAM (ICD-V72.31) (ICD10-Z00.00)  DEPRESSION (ICD-311) (ICD10-F32.9)      ANXIETY (ICD-300.00) (ICD10-F41.9)  ABNORMAL PAP SMEAR 12/11 LSIL; COLPO 12/11; BX CIN I; 6/12 ASCUS PAP; 4/13 PAP NEG; HR HPV POS, NEG COLPO; 5/14 PAP/HR HPV NEG (ICD-795.00) (ICD10-R87.619)  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)  PATELLAR DISLOCATION-MEDIAL RETINACULAR TEAR; RIGHT KNEE (ICD-836.3) (ICD10-S83.006)  OBESITY (ICD-278.00) (ICD10-E66.9)  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)  SEXUALLY TRANSMITTED DISEASE, EXPOSURE TO-H/O GC, TRICH, HPV, CHLAMYDIA (ICD-V01.6) (ICD10-Z20.2)  S/P CHOLECYSTECTOMY (ICD-V45.79) (ICD10-Z90.49)    Allergies:   PENICILLIN V POTASSIUM (PENICILLIN V POTASSIUM) (Critical)    Meds (prior to this call):   LANTUS 100 UNIT/ML SUBCUTANEOUS SOLUTION (INSULIN GLARGINE) 45 units sq daily  HUMALOG KWIKPEN 100 UNIT/ML SUBCUTANEOUS SOLUTION PEN-INJECTOR (INSULIN LISPRO) Use 6 units before each meal, 3 units with snack; Route: SUBCUTANEOUS  HYDROCHLOROTHIAZIDE 25 MG ORAL TABLET (HYDROCHLOROTHIAZIDE) Take one tablet by mouth once daily; Route: ORAL  ATORVASTATIN CALCIUM 40 MG ORAL TABLET (ATORVASTATIN CALCIUM) Take one tablet by mouth once daily; Route: ORAL      * ACCUCHECK GLUCOMETER check sugars three times daily Dx: Diabetes type II with retinopathy E11.319      RELION ALL-IN-ONE DEVICE (BLOOD GLUC METER DISP-STRIPS) Use three times daily (whichever brand covered/with meter)      LANCETS (LANCETS) Use as directed.       INSULIN SYRINGE 30G X 1/2" 0.5 ML (INSULIN SYRINGE-NEEDLE U-100) use to inject insulin daily under skin      BD DISP NEEDLES 30G X 1/2" (NEEDLE (DISP)) use small needle to inject insulin      ACCU-CHEK AVIVA IN VITRO STRIP (GLUCOSE BLOOD) use 1-3 times a day   dx E11 .319; Route: IN VITRO  FLUOXETINE HCL 20 MG CAPSULE (FLUOXETINE HCL) TAKE 1 CAPSULE BY MOUTH EVERY DAY  FREESTYLE LIBRE 14 DAY SENSOR (CONTINUOUS BLOOD GLUC SENSOR) Use as directed to monitor blood sugar .  Change every 14 days,  E 11.9  FREESTYLE LIBRE 14 DAY READER DEVICE (CONTINUOUS BLOOD GLUC RECEIVER) Use device as directed to test blood sugars. E11.9            Created By Neta Ehlers PharmD on 03/27/2019 at 04:08 PM    Electronically Signed By Neta Ehlers PharmD on 03/27/2019 at 04:09 PM

## 2019-05-16 ENCOUNTER — Ambulatory Visit

## 2019-05-16 NOTE — Telephone Encounter (Signed)
 Call Details:   Tried to call patient to check in on blood sugars and medication adherence. Per Dr Tiajuana Amass Humalog last filled in Sept 2019. No answer, LVM. Call back number provided. 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)  HYPERTENSION (ICD-401.9) (ICD10-I10)  ANEMIA (ICD-285.9) (ICD10-D64.9)  HIDRADENITIS (ICD-705.83) (ICD10-L73.2)  SHORTNESS OF BREATH (ICD-786.05) (ICD10-R06.02)  ANNUAL EXAM (ICD-V72.31) (ICD10-Z00.00)  DEPRESSION (ICD-311) (ICD10-F32.9)      ANXIETY (ICD-300.00) (ICD10-F41.9)  ABNORMAL PAP SMEAR 12/11 LSIL; COLPO 12/11; BX CIN I; 6/12 ASCUS PAP; 4/13 PAP NEG; HR HPV POS, NEG COLPO; 5/14 PAP/HR HPV NEG (ICD-795.00) (ICD10-R87.619)  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)  PATELLAR DISLOCATION-MEDIAL RETINACULAR TEAR; RIGHT KNEE (ICD-836.3) (ICD10-S83.006)  OBESITY (ICD-278.00) (ICD10-E66.9)  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)  SEXUALLY TRANSMITTED DISEASE, EXPOSURE TO-H/O GC, TRICH, HPV, CHLAMYDIA (ICD-V01.6) (ICD10-Z20.2)  S/P CHOLECYSTECTOMY (ICD-V45.79) (ICD10-Z90.49)    Allergies:   PENICILLIN V POTASSIUM (PENICILLIN V POTASSIUM) (Critical)    Meds (prior to this call):   LANTUS 100 UNIT/ML SUBCUTANEOUS SOLUTION (INSULIN GLARGINE) 45 units sq daily  HUMALOG KWIKPEN 100 UNIT/ML SUBCUTANEOUS SOLUTION PEN-INJECTOR (INSULIN LISPRO) Use 6 units before each meal, 3 units with snack; Route: SUBCUTANEOUS  HYDROCHLOROTHIAZIDE 25 MG ORAL TABLET (HYDROCHLOROTHIAZIDE) Take one tablet by mouth once daily; Route: ORAL  ATORVASTATIN CALCIUM 40 MG ORAL TABLET (ATORVASTATIN CALCIUM) Take one tablet by mouth once daily; Route: ORAL      * ACCUCHECK GLUCOMETER check sugars three times daily Dx: Diabetes type II with retinopathy E11.319      RELION ALL-IN-ONE DEVICE (BLOOD GLUC METER DISP-STRIPS) Use  three times daily (whichever brand covered/with meter)      LANCETS (LANCETS) Use as directed.      INSULIN SYRINGE 30G X 1/2" 0.5 ML (INSULIN SYRINGE-NEEDLE U-100) use to inject insulin daily under skin      BD DISP NEEDLES 30G X 1/2" (NEEDLE (DISP)) use small needle to inject insulin      ACCU-CHEK AVIVA IN VITRO STRIP (GLUCOSE BLOOD) use 1-3 times a day   dx E11 .319; Route: IN VITRO  FLUOXETINE HCL 20 MG CAPSULE (FLUOXETINE HCL) TAKE 1 CAPSULE BY MOUTH EVERY DAY  FREESTYLE LIBRE 14 DAY SENSOR (CONTINUOUS BLOOD GLUC SENSOR) Use as directed to monitor blood sugar .  Change every 14 days,  E 11.9  FREESTYLE LIBRE 14 DAY READER DEVICE (CONTINUOUS BLOOD GLUC RECEIVER) Use device as directed to test blood sugars. E11.9            Created By Neta Ehlers PharmD on 05/16/2019 at 12:01 PM    Electronically Signed By Neta Ehlers PharmD on 05/16/2019 at 12:02 PM

## 2019-05-28 ENCOUNTER — Ambulatory Visit

## 2019-05-28 MED ORDER — INSULIN LISPRO: 1 | 9 | 4 refills | 0 days | Status: AC

## 2019-05-29 ENCOUNTER — Ambulatory Visit

## 2019-05-29 MED ORDER — FLUOXETINE HCL: 1 | 90 | 1 refills | 0 days | Status: DC

## 2019-05-29 NOTE — Progress Notes (Signed)
 Pharmacist Visit    Service Due by Standard Protocol Rules: DIAB EYE EX, HGBA1C or HGBA1C%POC, PHQ2 SCORE, PAP SMEAR.      Initial Screening   * [place]: [place] ([place])  Ht: 64.5 in.  Wt: 226.8 lbs.   BMI: 38.47  with shoes  Temp: 99.3 deg F.     BP (Initial [place] Screening): 134 / 86      BP (Rechecked, Actionable): 134 / 86 mmHg   HR: 96     Health Mgmt materials? Weight Education Handout for high BMI    Med List: PRINTED by [facility] for patient     Self Mgmt materials provided? Printed handout: Seasonal Allergies      Comments: ........................................Marland KitchenVito Backers  May 29, 2019 9:08 AM        Chief Complaint:   Patient presents to clinic for follow up for collaborative management of diabetes with Dr [name].  Pt is a 43 year old female with PMH significant for:     History of Present Illness:   Plan at last visit was:   1. Start eating breakfast consistently.   2. Start decreasing carb portion (rice, potatoes), add veg to each meal  3. Carry hard candy or glucose tablets in purse in case of low blood sugar  4. Continue Lantus 45 units daily, Humalog 6 units with meals and 3 units with snacks  5. Test blood sugars 4x/day, and occasionally 2 hrs post meals, Rx for [employer] sent.    Was set-back the last few months - family passed d/t covid  DIABETES  DM Hx  - Duration:  since ~43 years old  - Endocrinologist: only when pregnant    Current meds: Lantus 45 units daily (taking 42), Humalog 3 -8 units before meals, 3 units with snacks -   Previously tried: metformin 10+ years ago, upset stomach    Hypoglycemia: no s/sx  Meter: [place]; Freestyle Libre  Testing BG: 3x/day, restarted recently  Home BG readings:   - Fasting today 200;  180,  160  -lunch 2 hr ppg 280  -hs 195    LIFESTYLE  Diet: eats out 3x/week  24 hr recall:    D - cheeseburger and fries, no soda   B- bacon and egg on wheat  snack - doritos, ice cream, oreos  Beverages: soda 2x/week, but mostly water  Alcohol: 2  drinks/week  Activity/Exercise: minimal, not walking    (continued):   MED MANAGEMENT  Does not have assistance managing and taking medications.    Medication cost: No issue, uses FSA card  Medication adherence: No missed doses of DM meds, not taking fluoxetine, stopped in March    Medication list and drug allergies reviewed with the patient. Logician, Dr. Tiajuana Amass, and [facility] med lists reviewed.  Patient is a good historian of medications and indications. Not taking fluxoetine, removed from med list as she does not want to resume at this time.                      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)  PATELLAR DISLOCATION-MEDIAL RETINACULAR TEAR; RIGHT KNEE (ICD-836.3) (ICD10-S83.006)  OBESITY (ICD-278.00) (ICD10-E66.9)  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)  SEXUALLY TRANSMITTED DISEASE, EXPOSURE TO-H/O GC, TRICH, HPV, CHLAMYDIA (ICD-V01.6) (ICD10-Z20.2)  S/P CHOLECYSTECTOMY (ICD-V45.79) (ICD10-Z90.49)           Medications (prior to today's visit):  LANTUS 100 UNIT/ML SUBCUTANEOUS SOLUTION (INSULIN GLARGINE) 45 units sq daily  HUMALOG 100 UNITS/ML KWIKPEN (INSULIN LISPRO) USE 3 UNITS BEFORE EACH MEAL  HYDROCHLOROTHIAZIDE 25 MG ORAL TABLET (HYDROCHLOROTHIAZIDE) Take one tablet by mouth once daily; Route: ORAL  ATORVASTATIN CALCIUM 40 MG ORAL TABLET (ATORVASTATIN CALCIUM) Take one tablet by mouth once daily; Route: ORAL      * ACCUCHECK GLUCOMETER check  sugars three times daily Dx: Diabetes type II with retinopathy E11.319      RELION ALL-IN-ONE DEVICE (BLOOD GLUC METER DISP-STRIPS) Use three times daily (whichever brand covered/with meter)      LANCETS (LANCETS) Use as directed.      INSULIN SYRINGE 30G X 1/2" 0.5 ML (INSULIN SYRINGE-NEEDLE U-100) use to inject insulin daily under skin      BD DISP NEEDLES 30G X 1/2" (NEEDLE (DISP)) use small needle to inject insulin      ACCU-CHEK AVIVA IN VITRO STRIP (GLUCOSE BLOOD) use 1-3 times a day   dx E11 .319; Route: IN VITRO  FLUOXETINE HCL 20 MG CAPSULE (FLUOXETINE HCL) TAKE 1 CAPSULE BY MOUTH EVERY DAY  FREESTYLE LIBRE 14 DAY SENSOR (CONTINUOUS BLOOD GLUC SENSOR) Use as directed to monitor blood sugar .  Change every 14 days,  E 11.9  FREESTYLE LIBRE 14 DAY READER DEVICE (CONTINUOUS BLOOD GLUC RECEIVER) Use device as directed to test blood sugars. E11.9    Medications (after today's visit):  LANTUS 100 UNIT/ML SUBCUTANEOUS SOLUTION (INSULIN GLARGINE) 45 units sq daily  HUMALOG 100 UNITS/ML KWIKPEN (INSULIN LISPRO) USE 3 UNITS BEFORE EACH MEAL  HYDROCHLOROTHIAZIDE 25 MG ORAL TABLET (HYDROCHLOROTHIAZIDE) Take one tablet by mouth once daily; Route: ORAL  ATORVASTATIN CALCIUM 40 MG ORAL TABLET (ATORVASTATIN CALCIUM) Take one tablet by mouth once daily; Route: ORAL      * ACCUCHECK GLUCOMETER check sugars three times daily Dx: Diabetes type II with retinopathy E11.319      RELION ALL-IN-ONE DEVICE (BLOOD GLUC METER DISP-STRIPS) Use three times daily (whichever brand covered/with meter)      LANCETS (LANCETS) Use as directed.      INSULIN SYRINGE 30G X 1/2" 0.5 ML (INSULIN SYRINGE-NEEDLE U-100) use to inject insulin daily under skin      BD DISP NEEDLES 30G X 1/2" (NEEDLE (DISP)) use small needle to inject insulin      ACCU-CHEK AVIVA IN VITRO STRIP (GLUCOSE BLOOD) use 1-3 times a day   dx E11 .319; Route: IN VITRO  FREESTYLE LIBRE 14 DAY SENSOR (CONTINUOUS BLOOD GLUC SENSOR) Use as directed to monitor blood sugar .  Change  every 14 days,  E 11.9  FREESTYLE LIBRE 14 DAY READER DEVICE (CONTINUOUS BLOOD GLUC RECEIVER) Use device as directed to test blood sugars. E11.9       Medications Reviewed:  Done      Allergies (prior to today's visit):  PENICILLIN V POTASSIUM (PENICILLIN V POTASSIUM) (Critical)    Allergies (changes):  Changed allergy or adverse reaction from PENICILLIN V POTASSIUM (PENICILLIN V POTASSIUM) (Critical) to PENICILLIN V POTASSIUM (PENICILLIN V POTASSIUM) (Severe) - Signed     Allergies Reviewed:  Done  Vitals:   Ht: 64.5 in.  Wt: 226.8 lbs.  BMI (in-lb) 38.47  Temp: 99.3deg F.     BP (Initial [facility] Screening): 134 / 86     BP (Rechecked, Actionable): 134 / 86 mmHg   Pulse Rate: 96 bpm          Assessment & Plan:   DIABETES  A1c of 13.3% in Dec 2019 at goal of A1c < 7% per ADA guidelines, increased from 11.8%.  Limited fasting and post-prandial blood sugars have been elevated and no longer at goal. Had set backs the past few months in diabetes management, she is disheartened and embarassed by her lack of progress. Commended her for coming back, she is willing to make lifestyle changes again. Back to checking blood sugars, and is now taking Lanuts + Humalog with no missed doses. Counseled and watched patient place [employer] today, synced to [employer], counseled to still use glucometer for lows. Per patient report, metformin was tried in the past but had stomach issues, unclear if IR or ER - willing to try again if Dr. [name] agrees.    Plan:   1. Patient set goals of Max of eating out 1x/week, and walk 2x/week, at least 1 hr   2. Resume Lantus 45 units daily, Humalog 6 units with meals and 3 units with snacks  3. Encouraged her to check blood sugars fasting and before each meal, and q8hrs.  4. Due for A1c and BMP at f/u visit.  5. Consider restarting metformin    MED MANAGEMENT/ADHERENCE  Patient has good adherence. Medication list and allergies reconciled and updated based on patient report. Patient  education on dosing, indication, storage, side effects, and administration provided. BP above goal of 130/80, will recheck next visit.    PharmD Follow up in:  3 weeks, 8/13  Booked Dr [name] appt 9/10       Hi Linde Gillis got re-engaged today!   1. She did stop taking her fluoxetine in March and does not want to resume, will remove from med list if ok with you.   2. Thoughts on restarting metformin next visit?    3. Can you also send for A1c and BMP for her to get done next visit?   Thanks! ......................................Marland KitchenNeta Ehlers, PharmD  May 29, 2019 1:02 PM    Yes can take fluoxetine off med list  Agree with metformin, I'm so glad she is back  Yes, I hope she comes and will definitely send at next visit .......................................[name], MD  May 29, 2019 2:02 PM        Created By Vito Backers on 05/29/2019 at 09:08 AM    Electronically Signed By Neta Ehlers PharmD on 05/29/2019 at 02:41 PM

## 2019-06-27 ENCOUNTER — Ambulatory Visit

## 2019-06-27 NOTE — Telephone Encounter (Signed)
 Call Details:   Called pt to reschedule missed diabetes management visit from 8/13. Did not reach patient. LVM with callback number.    RESPONSE/ORDERS:    Called pt to reschedule missed diabetes management visit from 8/13. Did not reach patient. LVM with callback number. 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)  HYPERTENSION (ICD-401.9) (ICD10-I10)  ANEMIA (ICD-285.9) (ICD10-D64.9)  HIDRADENITIS (ICD-705.83) (ICD10-L73.2)  SHORTNESS OF BREATH (ICD-786.05) (ICD10-R06.02)  ANNUAL EXAM (ICD-V72.31) (ICD10-Z00.00)  DEPRESSION (ICD-311) (ICD10-F32.9)      ANXIETY (ICD-300.00) (ICD10-F41.9)  ABNORMAL PAP SMEAR 12/11 LSIL; COLPO 12/11; BX CIN I; 6/12 ASCUS PAP; 4/13 PAP NEG; HR HPV POS, NEG COLPO; 5/14 PAP/HR HPV NEG (ICD-795.00) (ICD10-R87.619)  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)  PATELLAR DISLOCATION-MEDIAL RETINACULAR TEAR; RIGHT KNEE (ICD-836.3) (ICD10-S83.006)  OBESITY (ICD-278.00) (ICD10-E66.9)  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)  SEXUALLY TRANSMITTED DISEASE, EXPOSURE TO-H/O GC, TRICH, HPV, CHLAMYDIA (ICD-V01.6) (ICD10-Z20.2)  S/P CHOLECYSTECTOMY (ICD-V45.79) (ICD10-Z90.49)    Allergies:   PENICILLIN V POTASSIUM (PENICILLIN V POTASSIUM) (Severe)    Meds (prior to this call):   LANTUS 100 UNIT/ML SUBCUTANEOUS SOLUTION (INSULIN GLARGINE) 45 units sq daily  HUMALOG 100 UNITS/ML KWIKPEN (INSULIN LISPRO) USE 3 UNITS BEFORE EACH MEAL  HYDROCHLOROTHIAZIDE 25 MG ORAL TABLET (HYDROCHLOROTHIAZIDE) Take one tablet by mouth once daily; Route: ORAL  ATORVASTATIN CALCIUM 40 MG ORAL TABLET (ATORVASTATIN CALCIUM) Take one tablet by mouth once daily; Route: ORAL      * ACCUCHECK GLUCOMETER check sugars three times daily Dx: Diabetes type II with retinopathy E11.319      RELION ALL-IN-ONE DEVICE (BLOOD GLUC METER DISP-STRIPS) Use  three times daily (whichever brand covered/with meter)      LANCETS (LANCETS) Use as directed.      INSULIN SYRINGE 30G X 1/2" 0.5 ML (INSULIN SYRINGE-NEEDLE U-100) use to inject insulin daily under skin      BD DISP NEEDLES 30G X 1/2" (NEEDLE (DISP)) use small needle to inject insulin      ACCU-CHEK AVIVA IN VITRO STRIP (GLUCOSE BLOOD) use 1-3 times a day   dx E11 .319; Route: IN VITRO  FREESTYLE LIBRE 14 DAY SENSOR (CONTINUOUS BLOOD GLUC SENSOR) Use as directed to monitor blood sugar .  Change every 14 days,  E 11.9  FREESTYLE LIBRE 14 DAY READER DEVICE (CONTINUOUS BLOOD GLUC RECEIVER) Use device as directed to test blood sugars. E11.9            Created By York Pellant PharmD on 06/27/2019 at 10:54 AM    Electronically Signed By York Pellant PharmD on 06/27/2019 at 11:00 AM

## 2019-07-17 ENCOUNTER — Ambulatory Visit

## 2019-07-21 ENCOUNTER — Ambulatory Visit

## 2019-07-21 NOTE — Telephone Encounter (Signed)
 RESPONSE/ORDERS:    Called patient to reschedule with pharmacy visit and/or Dr. [name]. No answer, LVM. 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)  HYPERTENSION (ICD-401.9) (ICD10-I10)  ANEMIA (ICD-285.9) (ICD10-D64.9)  HIDRADENITIS (ICD-705.83) (ICD10-L73.2)  SHORTNESS OF BREATH (ICD-786.05) (ICD10-R06.02)  ANNUAL EXAM (ICD-V72.31) (ICD10-Z00.00)  DEPRESSION (ICD-311) (ICD10-F32.9)      ANXIETY (ICD-300.00) (ICD10-F41.9)  ABNORMAL PAP SMEAR 12/11 LSIL; COLPO 12/11; BX CIN I; 6/12 ASCUS PAP; 4/13 PAP NEG; HR HPV POS, NEG COLPO; 5/14 PAP/HR HPV NEG (ICD-795.00) (ICD10-R87.619)  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)  PATELLAR DISLOCATION-MEDIAL RETINACULAR TEAR; RIGHT KNEE (ICD-836.3) (ICD10-S83.006)  OBESITY (ICD-278.00) (ICD10-E66.9)  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)  SEXUALLY TRANSMITTED DISEASE, EXPOSURE TO-H/O GC, TRICH, HPV, CHLAMYDIA (ICD-V01.6) (ICD10-Z20.2)  S/P CHOLECYSTECTOMY (ICD-V45.79) (ICD10-Z90.49)    Allergies:   PENICILLIN V POTASSIUM (PENICILLIN V POTASSIUM) (Severe)    Meds (prior to this call):   LANTUS 100 UNIT/ML SUBCUTANEOUS SOLUTION (INSULIN GLARGINE) 45 units sq daily  HUMALOG 100 UNITS/ML KWIKPEN (INSULIN LISPRO) USE 3 UNITS BEFORE EACH MEAL  HYDROCHLOROTHIAZIDE 25 MG ORAL TABLET (HYDROCHLOROTHIAZIDE) Take one tablet by mouth once daily; Route: ORAL  ATORVASTATIN CALCIUM 40 MG ORAL TABLET (ATORVASTATIN CALCIUM) Take one tablet by mouth once daily; Route: ORAL      * ACCUCHECK GLUCOMETER check sugars three times daily Dx: Diabetes type II with retinopathy E11.319      RELION ALL-IN-ONE DEVICE (BLOOD GLUC METER DISP-STRIPS) Use three times daily (whichever brand covered/with meter)      LANCETS (LANCETS) Use as directed.      INSULIN SYRINGE 30G X 1/2" 0.5 ML (INSULIN  SYRINGE-NEEDLE U-100) use to inject insulin daily under skin      BD DISP NEEDLES 30G X 1/2" (NEEDLE (DISP)) use small needle to inject insulin      ACCU-CHEK AVIVA IN VITRO STRIP (GLUCOSE BLOOD) use 1-3 times a day   dx E11 .319; Route: IN VITRO  FREESTYLE LIBRE 14 DAY SENSOR (CONTINUOUS BLOOD GLUC SENSOR) Use as directed to monitor blood sugar .  Change every 14 days,  E 11.9  FREESTYLE LIBRE 14 DAY READER DEVICE (CONTINUOUS BLOOD GLUC RECEIVER) Use device as directed to test blood sugars. E11.9            Created By Neta Ehlers PharmD on 07/21/2019 at 11:22 AM    Electronically Signed By Neta Ehlers PharmD on 07/22/2019 at 09:18 AM

## 2019-07-23 ENCOUNTER — Ambulatory Visit

## 2019-07-23 NOTE — Telephone Encounter (Signed)
----   Converted from flag ----  ---- 07/22/2019 9:19 AM, Neta Ehlers, PharmD wrote:  I gave her a call, no answer. I'll try again in a few days!    ---- 07/17/2019 1:09 PM, [name], MD wrote:  she no showed :(   maybe you can call her when you are back? hoping  you had great vaca. [name]    ---- 07/11/2019 4:12 PM, Neta Ehlers, PharmD wrote:  [name], I'm not here when she's scheduled to see you next week. She was supposed to be using the [facility] but she hasn't scanned it since 7/31, not sure why she stopped using it after 5 days? I'll email the report to you. If she's open to seeing me again, coordinators/Kayla Mccarthy should be able to book her!  Thanks, Steph  ------------------------------    Created By [name] MD on 07/23/2019 at 09:08 PM    Electronically Signed By [name] MD on 07/23/2019 at 09:08 PM

## 2019-07-30 ENCOUNTER — Ambulatory Visit

## 2019-07-30 NOTE — Telephone Encounter (Signed)
 RESPONSE/ORDERS:    Phone went straight to VM. Called to reschedule missed appts with Dr. [name] and I. 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)  HYPERTENSION (ICD-401.9) (ICD10-I10)  ANEMIA (ICD-285.9) (ICD10-D64.9)  HIDRADENITIS (ICD-705.83) (ICD10-L73.2)  SHORTNESS OF BREATH (ICD-786.05) (ICD10-R06.02)  ANNUAL EXAM (ICD-V72.31) (ICD10-Z00.00)  DEPRESSION (ICD-311) (ICD10-F32.9)      ANXIETY (ICD-300.00) (ICD10-F41.9)  ABNORMAL PAP SMEAR 12/11 LSIL; COLPO 12/11; BX CIN I; 6/12 ASCUS PAP; 4/13 PAP NEG; HR HPV POS, NEG COLPO; 5/14 PAP/HR HPV NEG (ICD-795.00) (ICD10-R87.619)  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)  PATELLAR DISLOCATION-MEDIAL RETINACULAR TEAR; RIGHT KNEE (ICD-836.3) (ICD10-S83.006)  OBESITY (ICD-278.00) (ICD10-E66.9)  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)  SEXUALLY TRANSMITTED DISEASE, EXPOSURE TO-H/O GC, TRICH, HPV, CHLAMYDIA (ICD-V01.6) (ICD10-Z20.2)  S/P CHOLECYSTECTOMY (ICD-V45.79) (ICD10-Z90.49)    Allergies:   PENICILLIN V POTASSIUM (PENICILLIN V POTASSIUM) (Severe)    Meds (prior to this call):   LANTUS 100 UNIT/ML SUBCUTANEOUS SOLUTION (INSULIN GLARGINE) 45 units sq daily  HUMALOG 100 UNITS/ML KWIKPEN (INSULIN LISPRO) USE 3 UNITS BEFORE EACH MEAL  HYDROCHLOROTHIAZIDE 25 MG ORAL TABLET (HYDROCHLOROTHIAZIDE) Take one tablet by mouth once daily; Route: ORAL  ATORVASTATIN CALCIUM 40 MG ORAL TABLET (ATORVASTATIN CALCIUM) Take one tablet by mouth once daily; Route: ORAL      * ACCUCHECK GLUCOMETER check sugars three times daily Dx: Diabetes type II with retinopathy E11.319      RELION ALL-IN-ONE DEVICE (BLOOD GLUC METER DISP-STRIPS) Use three times daily (whichever brand covered/with meter)      LANCETS (LANCETS) Use as directed.      INSULIN SYRINGE 30G X 1/2" 0.5 ML (INSULIN  SYRINGE-NEEDLE U-100) use to inject insulin daily under skin      BD DISP NEEDLES 30G X 1/2" (NEEDLE (DISP)) use small needle to inject insulin      ACCU-CHEK AVIVA IN VITRO STRIP (GLUCOSE BLOOD) use 1-3 times a day   dx E11 .319; Route: IN VITRO  FREESTYLE LIBRE 14 DAY SENSOR (CONTINUOUS BLOOD GLUC SENSOR) Use as directed to monitor blood sugar .  Change every 14 days,  E 11.9  FREESTYLE LIBRE 14 DAY READER DEVICE (CONTINUOUS BLOOD GLUC RECEIVER) Use device as directed to test blood sugars. E11.9            Created By Neta Ehlers PharmD on 07/30/2019 at 11:26 AM    Electronically Signed By Neta Ehlers PharmD on 07/30/2019 at 11:26 AM

## 2019-08-07 ENCOUNTER — Ambulatory Visit

## 2019-08-07 NOTE — Telephone Encounter (Signed)
 Call Details:   phone went straight to voice mail. 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)  HYPERTENSION (ICD-401.9) (ICD10-I10)  ANEMIA (ICD-285.9) (ICD10-D64.9)  HIDRADENITIS (ICD-705.83) (ICD10-L73.2)  SHORTNESS OF BREATH (ICD-786.05) (ICD10-R06.02)  ANNUAL EXAM (ICD-V72.31) (ICD10-Z00.00)  DEPRESSION (ICD-311) (ICD10-F32.9)      ANXIETY (ICD-300.00) (ICD10-F41.9)  ABNORMAL PAP SMEAR 12/11 LSIL; COLPO 12/11; BX CIN I; 6/12 ASCUS PAP; 4/13 PAP NEG; HR HPV POS, NEG COLPO; 5/14 PAP/HR HPV NEG (ICD-795.00) (ICD10-R87.619)  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)  PATELLAR DISLOCATION-MEDIAL RETINACULAR TEAR; RIGHT KNEE (ICD-836.3) (ICD10-S83.006)  OBESITY (ICD-278.00) (ICD10-E66.9)  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)  SEXUALLY TRANSMITTED DISEASE, EXPOSURE TO-H/O GC, TRICH, HPV, CHLAMYDIA (ICD-V01.6) (ICD10-Z20.2)  S/P CHOLECYSTECTOMY (ICD-V45.79) (ICD10-Z90.49)    Allergies:   PENICILLIN V POTASSIUM (PENICILLIN V POTASSIUM) (Severe)    Meds (prior to this call):   LANTUS 100 UNIT/ML SUBCUTANEOUS SOLUTION (INSULIN GLARGINE) 45 units sq daily  HUMALOG 100 UNITS/ML KWIKPEN (INSULIN LISPRO) USE 3 UNITS BEFORE EACH MEAL  HYDROCHLOROTHIAZIDE 25 MG ORAL TABLET (HYDROCHLOROTHIAZIDE) Take one tablet by mouth once daily; Route: ORAL  ATORVASTATIN CALCIUM 40 MG ORAL TABLET (ATORVASTATIN CALCIUM) Take one tablet by mouth once daily; Route: ORAL      * ACCUCHECK GLUCOMETER check sugars three times daily Dx: Diabetes type II with retinopathy E11.319      RELION ALL-IN-ONE DEVICE (BLOOD GLUC METER DISP-STRIPS) Use three times daily (whichever brand covered/with meter)      LANCETS (LANCETS) Use as directed.      INSULIN SYRINGE 30G X 1/2" 0.5 ML (INSULIN SYRINGE-NEEDLE U-100) use to inject insulin daily under  skin      BD DISP NEEDLES 30G X 1/2" (NEEDLE (DISP)) use small needle to inject insulin      ACCU-CHEK AVIVA IN VITRO STRIP (GLUCOSE BLOOD) use 1-3 times a day   dx E11 .319; Route: IN VITRO  FREESTYLE LIBRE 14 DAY SENSOR (CONTINUOUS BLOOD GLUC SENSOR) Use as directed to monitor blood sugar .  Change every 14 days,  E 11.9  FREESTYLE LIBRE 14 DAY READER DEVICE (CONTINUOUS BLOOD GLUC RECEIVER) Use device as directed to test blood sugars. E11.9            Created By [name] MD on 08/07/2019 at 08:56 AM    Electronically Signed By [name] MD on 08/07/2019 at 08:57 AM

## 2019-08-07 NOTE — Progress Notes (Signed)
 [facility] August 07, 2019  [address]  [place] [address]  Main: [phone]  Fax:   Patient Portal: [email or web address]            [name]  [address]  [place], [place]  [address]        Dear  [name],    Taking control of your diabetes can help you feel better and stay healthy. [facility] is dedicated to helping you control your diabetes. The last time you had your hemoglobin A1C checked, it is was over 9 which means your average sugars is well over 200.      The A1C reflects your average blood glucose level over the prior three months. It is the best way to know your overall blood glucose control. The recommended value for an A1C is under 7. People with diabetes in control (with A1C's under 7) are less likely to have problems with complications in their eyes and kidneys from diabetes.      Because your A1C was high, we are recommending you come in to see your doctor, or someone on your doctor's team soon.  Please call [phone] to make an appointment. For your convenience, we are now open evenings until 8 Mondays-Thursdays and Saturdays 8-12.      Together we can work together to bring your diabetes under control.    Anarely, your phone goes directly to voice mail. Let's work together--I am leaving a slip for you to do bloods ANYTIME and then we could have a phone visit even. Help me understand how to have you take care of yourself--We have folks here to help with other things too if you are feeling overwhelmed.         Sincerely,   Your Care Team at [facility]  on behalf of [name], MD    (MRN: [id number])              Created By [name] MD on 08/07/2019 at 08:58 AM    Electronically Signed By [name] MD on 08/07/2019 at 08:58 AM

## 2019-08-11 ENCOUNTER — Ambulatory Visit

## 2019-08-11 NOTE — Telephone Encounter (Signed)
 Call Details:   called to outreach for DM, lmom .......................................[name], MD  August 11, 2019 11:41 AM      texted for video visit. 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)  HYPERTENSION (ICD-401.9) (ICD10-I10)  ANEMIA (ICD-285.9) (ICD10-D64.9)  HIDRADENITIS (ICD-705.83) (ICD10-L73.2)  SHORTNESS OF BREATH (ICD-786.05) (ICD10-R06.02)  ANNUAL EXAM (ICD-V72.31) (ICD10-Z00.00)  DEPRESSION (ICD-311) (ICD10-F32.9)      ANXIETY (ICD-300.00) (ICD10-F41.9)  ABNORMAL PAP SMEAR 12/11 LSIL; COLPO 12/11; BX CIN I; 6/12 ASCUS PAP; 4/13 PAP NEG; HR HPV POS, NEG COLPO; 5/14 PAP/HR HPV NEG (ICD-795.00) (ICD10-R87.619)  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)  PATELLAR DISLOCATION-MEDIAL RETINACULAR TEAR; RIGHT KNEE (ICD-836.3) (ICD10-S83.006)  OBESITY (ICD-278.00) (ICD10-E66.9)  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)  SEXUALLY TRANSMITTED DISEASE, EXPOSURE TO-H/O GC, TRICH, HPV, CHLAMYDIA (ICD-V01.6) (ICD10-Z20.2)  S/P CHOLECYSTECTOMY (ICD-V45.79) (ICD10-Z90.49)    Allergies:   PENICILLIN V POTASSIUM (PENICILLIN V POTASSIUM) (Severe)    Meds (prior to this call):   LANTUS 100 UNIT/ML SUBCUTANEOUS SOLUTION (INSULIN GLARGINE) 45 units sq daily  HUMALOG 100 UNITS/ML KWIKPEN (INSULIN LISPRO) USE 3 UNITS BEFORE EACH MEAL  HYDROCHLOROTHIAZIDE 25 MG ORAL TABLET (HYDROCHLOROTHIAZIDE) Take one tablet by mouth once daily; Route: ORAL  ATORVASTATIN CALCIUM 40 MG ORAL TABLET (ATORVASTATIN CALCIUM) Take one tablet by mouth once daily; Route: ORAL      * ACCUCHECK GLUCOMETER check sugars three times daily Dx: Diabetes type II with retinopathy E11.319      RELION ALL-IN-ONE DEVICE (BLOOD GLUC METER DISP-STRIPS) Use three times daily (whichever brand covered/with meter)      LANCETS (LANCETS) Use as  directed.      INSULIN SYRINGE 30G X 1/2" 0.5 ML (INSULIN SYRINGE-NEEDLE U-100) use to inject insulin daily under skin      BD DISP NEEDLES 30G X 1/2" (NEEDLE (DISP)) use small needle to inject insulin      ACCU-CHEK AVIVA IN VITRO STRIP (GLUCOSE BLOOD) use 1-3 times a day   dx E11 .319; Route: IN VITRO  FREESTYLE LIBRE 14 DAY SENSOR (CONTINUOUS BLOOD GLUC SENSOR) Use as directed to monitor blood sugar .  Change every 14 days,  E 11.9  FREESTYLE LIBRE 14 DAY READER DEVICE (CONTINUOUS BLOOD GLUC RECEIVER) Use device as directed to test blood sugars. E11.9            Created By [name] MD on 08/11/2019 at 11:40 AM    Electronically Signed By [name] MD on 08/11/2019 at 11:41 AM

## 2019-08-21 ENCOUNTER — Ambulatory Visit

## 2019-08-21 NOTE — Telephone Encounter (Signed)
----   Converted from flag ----  ---- 08/09/2019 11:55 PM, [name] wrote:  Hi Dr. Willa Frater,  I will call this patient and help reschedule appointment  [name] [facility] ) [phone]    ---- 08/07/2019 8:59 AM, [name], MD wrote:  Dear [name], I can not get this pt in--could you try? I will overbook her/leave labs for her etc. What are her barriers? Last time I saw her she was feeling overwhelmed (single parent , daughter was having surgery) and also copays were high. Lets work together to see if we can get her numbers in and down! [name]  ------------------------------    Created By [name] MD on 08/21/2019 at 03:55 PM    Electronically Signed By [name] MD on 08/21/2019 at 03:55 PM

## 2019-08-25 ENCOUNTER — Ambulatory Visit

## 2019-08-25 NOTE — Telephone Encounter (Signed)
RESPONSE/ORDERS:    Tried to call patient to check in on BG. No answer, LVM. Call back number provided. 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)  HYPERTENSION (ICD-401.9) (ICD10-I10)  ANEMIA (ICD-285.9) (ICD10-D64.9)  HIDRADENITIS (ICD-705.83) (ICD10-L73.2)  SHORTNESS OF BREATH (ICD-786.05) (ICD10-R06.02)  ANNUAL EXAM (ICD-V72.31) (ICD10-Z00.00)  DEPRESSION (ICD-311) (ICD10-F32.9)      ANXIETY (ICD-300.00) (ICD10-F41.9)  ABNORMAL PAP SMEAR 12/11 LSIL; COLPO 12/11; BX CIN I; 6/12 ASCUS PAP; 4/13 PAP NEG; HR HPV POS, NEG COLPO; 5/14 PAP/HR HPV NEG (ICD-795.00) (ICD10-R87.619)  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)  PATELLAR DISLOCATION-MEDIAL RETINACULAR TEAR; RIGHT KNEE (ICD-836.3) (ICD10-S83.006)  OBESITY (ICD-278.00) (ICD10-E66.9)  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)  SEXUALLY TRANSMITTED DISEASE, EXPOSURE TO-H/O GC, TRICH, HPV, CHLAMYDIA (ICD-V01.6) (ICD10-Z20.2)  S/P CHOLECYSTECTOMY (ICD-V45.79) (ICD10-Z90.49)    Allergies:   PENICILLIN V POTASSIUM (PENICILLIN V POTASSIUM) (Severe)    Meds (prior to this call):   LANTUS 100 UNIT/ML SUBCUTANEOUS SOLUTION (INSULIN GLARGINE) 45 units sq daily  HUMALOG 100 UNITS/ML KWIKPEN (INSULIN LISPRO) USE 3 UNITS BEFORE EACH MEAL  HYDROCHLOROTHIAZIDE 25 MG ORAL TABLET (HYDROCHLOROTHIAZIDE) Take one tablet by mouth once daily; Route: ORAL  ATORVASTATIN CALCIUM 40 MG ORAL TABLET (ATORVASTATIN CALCIUM) Take one tablet by mouth once daily; Route: ORAL      * ACCUCHECK GLUCOMETER check sugars three times daily Dx: Diabetes type II with retinopathy E11.319      RELION ALL-IN-ONE DEVICE (BLOOD GLUC METER DISP-STRIPS) Use three times daily (whichever brand covered/with meter)      LANCETS (LANCETS) Use as directed.      INSULIN SYRINGE 30G X 1/2" 0.5 ML (INSULIN SYRINGE-NEEDLE U-100) use to inject  insulin daily under skin      BD DISP NEEDLES 30G X 1/2" (NEEDLE (DISP)) use small needle to inject insulin      ACCU-CHEK AVIVA IN VITRO STRIP (GLUCOSE BLOOD) use 1-3 times a day   dx E11 .319; Route: IN VITRO  FREESTYLE LIBRE 14 DAY SENSOR (CONTINUOUS BLOOD GLUC SENSOR) Use as directed to monitor blood sugar .  Change every 14 days,  E 11.9  FREESTYLE LIBRE 14 DAY READER DEVICE (CONTINUOUS BLOOD GLUC RECEIVER) Use device as directed to test blood sugars. E11.9            Created By Neta Ehlers PharmD on 08/25/2019 at 12:31 PM    Electronically Signed By Neta Ehlers PharmD on 08/25/2019 at 12:32 PM

## 2019-09-08 ENCOUNTER — Ambulatory Visit

## 2019-09-08 NOTE — Telephone Encounter (Signed)
 RESPONSE/ORDERS:    Called to check in on BG. No answer, LVM. 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)  HYPERTENSION (ICD-401.9) (ICD10-I10)  ANEMIA (ICD-285.9) (ICD10-D64.9)  HIDRADENITIS (ICD-705.83) (ICD10-L73.2)  SHORTNESS OF BREATH (ICD-786.05) (ICD10-R06.02)  ANNUAL EXAM (ICD-V72.31) (ICD10-Z00.00)  DEPRESSION (ICD-311) (ICD10-F32.9)      ANXIETY (ICD-300.00) (ICD10-F41.9)  ABNORMAL PAP SMEAR 12/11 LSIL; COLPO 12/11; BX CIN I; 6/12 ASCUS PAP; 4/13 PAP NEG; HR HPV POS, NEG COLPO; 5/14 PAP/HR HPV NEG (ICD-795.00) (ICD10-R87.619)  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)  PATELLAR DISLOCATION-MEDIAL RETINACULAR TEAR; RIGHT KNEE (ICD-836.3) (ICD10-S83.006)  OBESITY (ICD-278.00) (ICD10-E66.9)  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)  SEXUALLY TRANSMITTED DISEASE, EXPOSURE TO-H/O GC, TRICH, HPV, CHLAMYDIA (ICD-V01.6) (ICD10-Z20.2)  S/P CHOLECYSTECTOMY (ICD-V45.79) (ICD10-Z90.49)    Allergies:   PENICILLIN V POTASSIUM (PENICILLIN V POTASSIUM) (Severe)    Meds (prior to this call):   LANTUS 100 UNIT/ML SUBCUTANEOUS SOLUTION (INSULIN GLARGINE) 45 units sq daily  HUMALOG 100 UNITS/ML KWIKPEN (INSULIN LISPRO) USE 3 UNITS BEFORE EACH MEAL  HYDROCHLOROTHIAZIDE 25 MG ORAL TABLET (HYDROCHLOROTHIAZIDE) Take one tablet by mouth once daily; Route: ORAL  ATORVASTATIN CALCIUM 40 MG ORAL TABLET (ATORVASTATIN CALCIUM) Take one tablet by mouth once daily; Route: ORAL      * ACCUCHECK GLUCOMETER check sugars three times daily Dx: Diabetes type II with retinopathy E11.319      RELION ALL-IN-ONE DEVICE (BLOOD GLUC METER DISP-STRIPS) Use three times daily (whichever brand covered/with meter)      LANCETS (LANCETS) Use as directed.      INSULIN SYRINGE 30G X 1/2" 0.5 ML (INSULIN SYRINGE-NEEDLE U-100) use to inject insulin daily  under skin      BD DISP NEEDLES 30G X 1/2" (NEEDLE (DISP)) use small needle to inject insulin      ACCU-CHEK AVIVA IN VITRO STRIP (GLUCOSE BLOOD) use 1-3 times a day   dx E11 .319; Route: IN VITRO  FREESTYLE LIBRE 14 DAY SENSOR (CONTINUOUS BLOOD GLUC SENSOR) Use as directed to monitor blood sugar .  Change every 14 days,  E 11.9  FREESTYLE LIBRE 14 DAY READER DEVICE (CONTINUOUS BLOOD GLUC RECEIVER) Use device as directed to test blood sugars. E11.9            Created By Neta Ehlers PharmD on 09/08/2019 at 11:13 AM    Electronically Signed By Neta Ehlers PharmD on 09/08/2019 at 11:13 AM

## 2019-09-29 ENCOUNTER — Ambulatory Visit

## 2019-09-29 NOTE — Telephone Encounter (Signed)
 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)  HYPERTENSION (ICD-401.9) (ICD10-I10)  ANEMIA (ICD-285.9) (ICD10-D64.9)  HIDRADENITIS (ICD-705.83) (ICD10-L73.2)  SHORTNESS OF BREATH (ICD-786.05) (ICD10-R06.02)  ANNUAL EXAM (ICD-V72.31) (ICD10-Z00.00)  DEPRESSION (ICD-311) (ICD10-F32.9)      ANXIETY (ICD-300.00) (ICD10-F41.9)  ABNORMAL PAP SMEAR 12/11 LSIL; COLPO 12/11; BX CIN I; 6/12 ASCUS PAP; 4/13 PAP NEG; HR HPV POS, NEG COLPO; 5/14 PAP/HR HPV NEG (ICD-795.00) (ICD10-R87.619)  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)  PATELLAR DISLOCATION-MEDIAL RETINACULAR TEAR; RIGHT KNEE (ICD-836.3) (ICD10-S83.006)  OBESITY (ICD-278.00) (ICD10-E66.9)  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)  SEXUALLY TRANSMITTED DISEASE, EXPOSURE TO-H/O GC, TRICH, HPV, CHLAMYDIA (ICD-V01.6) (ICD10-Z20.2)  S/P CHOLECYSTECTOMY (ICD-V45.79) (ICD10-Z90.49)    Allergies:   PENICILLIN V POTASSIUM (PENICILLIN V POTASSIUM) (Severe)    Meds (prior to this call):   LANTUS 100 UNIT/ML SUBCUTANEOUS SOLUTION (INSULIN GLARGINE) 45 units sq daily  HUMALOG 100 UNITS/ML KWIKPEN (INSULIN LISPRO) USE 3 UNITS BEFORE EACH MEAL  HYDROCHLOROTHIAZIDE 25 MG ORAL TABLET (HYDROCHLOROTHIAZIDE) Take one tablet by mouth once daily; Route: ORAL  ATORVASTATIN CALCIUM 40 MG ORAL TABLET (ATORVASTATIN CALCIUM) Take one tablet by mouth once daily; Route: ORAL      * ACCUCHECK GLUCOMETER check sugars three times daily Dx: Diabetes type II with retinopathy E11.319      RELION ALL-IN-ONE DEVICE (BLOOD GLUC METER DISP-STRIPS) Use three times daily (whichever brand covered/with meter)      LANCETS (LANCETS) Use as directed.      INSULIN SYRINGE 30G X 1/2" 0.5 ML (INSULIN SYRINGE-NEEDLE U-100) use to inject insulin daily under skin      BD DISP NEEDLES 30G X 1/2"  (NEEDLE (DISP)) use small needle to inject insulin      ACCU-CHEK AVIVA IN VITRO STRIP (GLUCOSE BLOOD) use 1-3 times a day   dx E11 .319; Route: IN VITRO  FREESTYLE LIBRE 14 DAY SENSOR (CONTINUOUS BLOOD GLUC SENSOR) Use as directed to monitor blood sugar .  Change every 14 days,  E 11.9  FREESTYLE LIBRE 14 DAY READER DEVICE (CONTINUOUS BLOOD GLUC RECEIVER) Use device as directed to test blood sugars. E11.9            Created By [name] MD on 09/29/2019 at 04:27 PM    Electronically Signed By [name] MD on 09/29/2019 at 04:27 PM

## 2019-10-17 ENCOUNTER — Ambulatory Visit

## 2019-10-17 NOTE — Telephone Encounter (Signed)
 RESPONSE/ORDERS:    Called patient to check in on DM. No answer, LVM. 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)  HYPERTENSION (ICD-401.9) (ICD10-I10)  ANEMIA (ICD-285.9) (ICD10-D64.9)  HIDRADENITIS (ICD-705.83) (ICD10-L73.2)  SHORTNESS OF BREATH (ICD-786.05) (ICD10-R06.02)  ANNUAL EXAM (ICD-V72.31) (ICD10-Z00.00)  DEPRESSION (ICD-311) (ICD10-F32.9)      ANXIETY (ICD-300.00) (ICD10-F41.9)  ABNORMAL PAP SMEAR 12/11 LSIL; COLPO 12/11; BX CIN I; 6/12 ASCUS PAP; 4/13 PAP NEG; HR HPV POS, NEG COLPO; 5/14 PAP/HR HPV NEG (ICD-795.00) (ICD10-R87.619)  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)  PATELLAR DISLOCATION-MEDIAL RETINACULAR TEAR; RIGHT KNEE (ICD-836.3) (ICD10-S83.006)  OBESITY (ICD-278.00) (ICD10-E66.9)  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)  SEXUALLY TRANSMITTED DISEASE, EXPOSURE TO-H/O GC, TRICH, HPV, CHLAMYDIA (ICD-V01.6) (ICD10-Z20.2)  S/P CHOLECYSTECTOMY (ICD-V45.79) (ICD10-Z90.49)    Allergies:   PENICILLIN V POTASSIUM (PENICILLIN V POTASSIUM) (Severe)    Meds (prior to this call):   LANTUS 100 UNIT/ML SUBCUTANEOUS SOLUTION (INSULIN GLARGINE) 45 units sq daily  HUMALOG 100 UNITS/ML KWIKPEN (INSULIN LISPRO) USE 3 UNITS BEFORE EACH MEAL  HYDROCHLOROTHIAZIDE 25 MG ORAL TABLET (HYDROCHLOROTHIAZIDE) Take one tablet by mouth once daily; Route: ORAL  ATORVASTATIN CALCIUM 40 MG ORAL TABLET (ATORVASTATIN CALCIUM) Take one tablet by mouth once daily; Route: ORAL      * ACCUCHECK GLUCOMETER check sugars three times daily Dx: Diabetes type II with retinopathy E11.319      RELION ALL-IN-ONE DEVICE (BLOOD GLUC METER DISP-STRIPS) Use three times daily (whichever brand covered/with meter)      LANCETS (LANCETS) Use as directed.      INSULIN SYRINGE 30G X 1/2" 0.5 ML (INSULIN SYRINGE-NEEDLE U-100) use to inject insulin  daily under skin      BD DISP NEEDLES 30G X 1/2" (NEEDLE (DISP)) use small needle to inject insulin      ACCU-CHEK AVIVA IN VITRO STRIP (GLUCOSE BLOOD) use 1-3 times a day   dx E11 .319; Route: IN VITRO  FREESTYLE LIBRE 14 DAY SENSOR (CONTINUOUS BLOOD GLUC SENSOR) Use as directed to monitor blood sugar .  Change every 14 days,  E 11.9  FREESTYLE LIBRE 14 DAY READER DEVICE (CONTINUOUS BLOOD GLUC RECEIVER) Use device as directed to test blood sugars. E11.9            Created By Neta Ehlers PharmD on 10/17/2019 at 09:34 AM    Electronically Signed By Neta Ehlers PharmD on 10/17/2019 at 09:35 AM

## 2019-11-06 ENCOUNTER — Ambulatory Visit

## 2019-11-06 NOTE — Care Plan (Signed)
Outreach to patient for [employer]       Patient [employer]   Spoke with the patient:  NO  Outreach Programs:  LCO, Schedule Visit, and Chronic Care    Unable to speak with patient:  1st voice message  Notes: 2021- Risk Contract. Patient is due for Diabetes A1C Screen needed  2021- Risk Contract. Patient is due for a   Visit needed              Questions for patients; to direct next steps           Patient Screening                 Mandatory In-Person COVID Screening Questions   I am going to ask you a few questions, prior to scheduling your visit.   [If done Pre-visit: Any positive responses, route to the Team RN immediately.]   [If done for Kellie Shropshire Students: Record results and make Phone Note, then route to 'Student, NU'.]   1. Do you have any of these new symptoms in the past ten days?     2. In the past two weeks, were you within 6 ft of someone     3. COVID tested   4. Quarantined   5. Notes or questions from patient (please .sign to complete)             Created By Phillis Knack on 11/06/2019 at 03:12 PM    Electronically Signed By Phillis Knack on 11/06/2019 at 03:13 PM

## 2019-11-10 ENCOUNTER — Ambulatory Visit

## 2019-11-10 MED ORDER — HYDROCHLOROTHIAZIDE: 1 | 90 | 1 refills | 0 days | Status: AC

## 2019-11-27 ENCOUNTER — Ambulatory Visit

## 2019-12-01 ENCOUNTER — Ambulatory Visit

## 2019-12-01 MED ORDER — INSULIN GLARGINE: 1 | 60 | 5 refills | 0 days | Status: AC

## 2019-12-01 MED ORDER — ATORVASTATIN CALCIUM: 1 | 90 | 3 refills | 0 days | Status: AC

## 2019-12-17 ENCOUNTER — Ambulatory Visit

## 2019-12-17 NOTE — Care Plan (Signed)
 Outreach to patient for [employer]       Patient [name]   Spoke with the patient:  NO  Outreach Programs:  LCO, Schedule Visit, and Chronic Care    Unable to speak with patient:  Voicemail box full or not set up  Notes: 2021- Risk Contract. Patient is due for Diabetes A1C Screen needed  2021- Risk Contract. Patient is due for a   Visit needed            Questions for patients; to direct next steps           Patient Screening                 Mandatory In-Person COVID Screening Questions   I am going to ask you a few questions, prior to scheduling your visit.   [If done Pre-visit: Any positive responses, route to the Team RN immediately.]   [If done for Kellie Shropshire Students: Record results and make Phone Note, then route to 'Student, NU'.]   1. Do you have any of these new symptoms in the past ten days?     2. In the past two weeks, were you within 6 ft of someone     3. COVID tested   4. Quarantined   5. Notes or questions from patient (please .sign to complete)             Created By Phillis Knack on 12/17/2019 at 11:28 AM    Electronically Signed By Phillis Knack on 12/17/2019 at 11:28 AM

## 2019-12-23 ENCOUNTER — Ambulatory Visit

## 2019-12-23 NOTE — Progress Notes (Signed)
 [facility] December 23, 2019  [address]  [place] [address]  Main: [phone]  Fax:   Patient Portal: [email or web address]            Lauryl Goughnour         [address]  [place], [place]  [address]           Dear [name],      Regular visits with your doctor and team are important to your overall health. These visits make sure that your medications and prescriptions are up-to-date, verify the need for any screenings or immunizations, and review your health. We have tried to call you but were not able to reach you.  Please call the office as soon as it is convenient so that our staff may schedule an appointment for you and also update our contact information. My name and direct dial are listed below.    If you have moved out of the area or chosen a new PCP (Primary Care Physician), please let us know so that we may remove you from our outreach lists.    We now have a Patient Portal [[email or web address] so you can review your medical records, refill prescriptions and request appointments.       Sincerely,     Phillis Knack   Your Care Team at [facility]  [phone]    ** Please be aware of our new extended hours to better care for you. Hours are: Monday through Thursday from 8 am to 8 pm; Friday from 8 am to 5 pm; Saturday from 8 am to 12 noon.    We do appreciate at least 24 hours notice to cancel an appointment. If this letter is in error, we do apologize.        Ref: 12/17/2019 for [phone]      Created By Phillis Knack on 12/23/2019 at 09:59 AM    Electronically Signed By Phillis Knack on 12/23/2019 at 09:59 AM

## 2020-01-26 ENCOUNTER — Ambulatory Visit

## 2020-01-26 NOTE — Telephone Encounter (Signed)
 RESPONSE/ORDERS:    Called patient to ask if she would like to come in earlier on 4/8 to see me at 930 that am prior to Dr. Sue Lush appt. No answer, LVM. Call back number provided.   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)  HYPERTENSION (ICD-401.9) (ICD10-I10)  ANEMIA (ICD-285.9) (ICD10-D64.9)  HIDRADENITIS (ICD-705.83) (ICD10-L73.2)  SHORTNESS OF BREATH (ICD-786.05) (ICD10-R06.02)  ANNUAL EXAM (ICD-V72.31) (ICD10-Z00.00)  DEPRESSION (ICD-311) (ICD10-F32.9)      ANXIETY (ICD-300.00) (ICD10-F41.9)  ABNORMAL PAP SMEAR 12/11 LSIL; COLPO 12/11; BX CIN I; 6/12 ASCUS PAP; 4/13 PAP NEG; HR HPV POS, NEG COLPO; 5/14 PAP/HR HPV NEG (ICD-795.00) (ICD10-R87.619)  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)  PATELLAR DISLOCATION-MEDIAL RETINACULAR TEAR; RIGHT KNEE (ICD-836.3) (ICD10-S83.006)  OBESITY (ICD-278.00) (ICD10-E66.9)  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)  SEXUALLY TRANSMITTED DISEASE, EXPOSURE TO-H/O GC, TRICH, HPV, CHLAMYDIA (ICD-V01.6) (ICD10-Z20.2)  S/P CHOLECYSTECTOMY (ICD-V45.79) (ICD10-Z90.49)    Allergies:   PENICILLIN V POTASSIUM (PENICILLIN V POTASSIUM) (Severe)    Meds (prior to this call):   LANTUS 100 UNIT/ML SUBCUTANEOUS SOLUTION (INSULIN GLARGINE) 45 units sq daily  HUMALOG 100 UNITS/ML KWIKPEN (INSULIN LISPRO) USE 3 UNITS BEFORE EACH MEAL  HYDROCHLOROTHIAZIDE 25 MG TAB (HYDROCHLOROTHIAZIDE) TAKE 1 TABLET BY MOUTH EVERY DAY  ATORVASTATIN 40 MG TABLET (ATORVASTATIN CALCIUM) TAKE 1 TABLET BY MOUTH EVERY DAY      * ACCUCHECK GLUCOMETER check sugars three times daily Dx: Diabetes type II with retinopathy E11.319      RELION ALL-IN-ONE DEVICE (BLOOD GLUC METER DISP-STRIPS) Use three times daily (whichever brand covered/with meter)      LANCETS (LANCETS) Use as directed.      INSULIN SYRINGE 30G X 1/2" 0.5 ML (INSULIN  SYRINGE-NEEDLE U-100) use to inject insulin daily under skin      BD DISP NEEDLES 30G X 1/2" (NEEDLE (DISP)) use small needle to inject insulin      ACCU-CHEK AVIVA IN VITRO STRIP (GLUCOSE BLOOD) use 1-3 times a day   dx E11 .319; Route: IN VITRO  FREESTYLE LIBRE 14 DAY SENSOR (CONTINUOUS BLOOD GLUC SENSOR) Use as directed to monitor blood sugar .  Change every 14 days,  E 11.9  FREESTYLE LIBRE 14 DAY READER DEVICE (CONTINUOUS BLOOD GLUC RECEIVER) Use device as directed to test blood sugars. E11.9            Created By Neta Ehlers PharmD on 01/26/2020 at 04:16 PM    Electronically Signed By Neta Ehlers PharmD on 01/29/2020 at 01:18 PM

## 2020-02-05 ENCOUNTER — Ambulatory Visit

## 2020-02-05 MED ORDER — CONTINUOUS BLOOD GLUC SENSOR: 6 | 3 refills | 0 days | Status: AC

## 2020-02-12 ENCOUNTER — Ambulatory Visit

## 2020-02-12 ENCOUNTER — Ambulatory Visit: Admitting: Internal Medicine

## 2020-02-12 ENCOUNTER — Ambulatory Visit: Admit: 2020-02-12 | Payer: HMO

## 2020-02-12 LAB — HX IMMUNOLOGY
HX HEPATITIS C ANTIBODY: NONREACTIVE
HX HIV 1-2: NONREACTIVE
HX TREPONEMAL ANTIBODY: NONREACTIVE

## 2020-02-12 LAB — HX BF-CHEM/URINE
HX ALBUMIN RANDOM URINE: <0.5 mg/dL
HX CREATININE, RANDOM URINE: 70.44 mg/dL

## 2020-02-12 LAB — HX HEM-ROUTINE
HX BASO #: 0 10*3/uL (ref 0.0–0.2)
HX BASO: 1 %
HX EOSIN #: 0.3 10*3/uL (ref 0.0–0.5)
HX EOSIN: 4 %
HX HCT: 37 % (ref 32.0–45.0)
HX HGB: 11.7 g/dL (ref 11.0–15.0)
HX IMMATURE GRANULOCYTE#: 0 10*3/uL (ref 0.0–0.1)
HX IMMATURE GRANULOCYTE: 0 %
HX LYMPH #: 3.1 10*3/uL (ref 1.0–4.0)
HX LYMPH: 40 %
HX MCH: 29 pg (ref 26.0–34.0)
HX MCHC: 31.6 g/dL — ABNORMAL LOW (ref 32.0–36.0)
HX MCV: 91.8 fL (ref 80.0–98.0)
HX MONO #: 0.4 10*3/uL (ref 0.2–0.8)
HX MONO: 5 %
HX MPV: 9.1 fL (ref 9.1–11.7)
HX NEUT #: 4 10*3/uL (ref 1.5–7.5)
HX NRBC #: 0 10*3/uL
HX NUCLEATED RBC: 0 %
HX PLT: 340 10*3/uL (ref 150–400)
HX RBC BLOOD COUNT: 4.03 M/uL (ref 3.70–5.00)
HX RDW: 15.8 % — ABNORMAL HIGH (ref 11.5–14.5)
HX SEG NEUT: 50 %
HX WBC: 7.9 10*3/uL (ref 4.0–11.0)

## 2020-02-12 LAB — HX CHEM-LFT
HX ALANINE AMINOTRANSFERASE (ALT/SGPT): 13 IU/L (ref 0–54)
HX ALKALINE PHOSPHATASE (ALK): 126 IU/L (ref 40–130)
HX ASPARTATE AMINOTRANFERASE (AST/SGOT): 16 IU/L (ref 10–42)
HX BILIRUBIN, TOTAL: 0.2 mg/dL (ref 0.2–1.1)

## 2020-02-12 LAB — HX POINT OF CARE: HX HGB A1C, POC: 8.5 % — ABNORMAL HIGH

## 2020-02-12 LAB — HX CHEM-PANELS
HX ANION GAP: 7 (ref 3–14)
HX BLOOD UREA NITROGEN: 11 mg/dL (ref 6–24)
HX CHLORIDE (CL): 107 meq/L (ref 98–110)
HX CO2: 29 meq/L (ref 20–30)
HX CREATININE (CR): 0.93 mg/dL (ref 0.57–1.30)
HX GFR, AFRICAN AMERICAN: 86 mL/min/{1.73_m2}
HX GFR, NON-AFRICAN AMERICAN: 74 mL/min/{1.73_m2}
HX POTASSIUM (K): 4.5 meq/L (ref 3.6–5.1)
HX SODIUM (NA): 143 meq/L (ref 135–145)

## 2020-02-12 LAB — HX HIV TESTING: HX HIV 1-2: NONREACTIVE

## 2020-02-12 LAB — HX CHEM-OTHER
HX % IRON SATURATION: 12 % — ABNORMAL LOW (ref 15–40)
HX FERRITIN: 16 ng/mL (ref 10–240)
HX IRON: 50 ug/dL (ref 37–170)
HX TOTAL IRON BINDING CAPACITY: 423 ug/dL (ref 253–463)
HX TRANSFERRIN: 302 mg/dL (ref 181–331)

## 2020-02-12 LAB — HX OTHER TESTS: HX HEPATITIS C ANTIBODY: NONREACTIVE

## 2020-02-12 LAB — HX CHEM-LIPIDS
HX CHOL-HDL RATIO: 3.4
HX CHOLESTEROL: 143 mg/dL (ref 110–199)
HX HIGH DENSITY LIPOPROTEIN CHOL (HDL): 42 mg/dL (ref 35–75)
HX HOURS FAST: 3 h
HX LDL: 89 mg/dL (ref 0–129)
HX TRIGLYCERIDES: 62 mg/dL (ref 40–250)

## 2020-02-12 LAB — HX CHOLESTEROL
HX CHOLESTEROL: 143 mg/dL (ref 110–199)
HX HIGH DENSITY LIPOPROTEIN CHOL (HDL): 42 mg/dL (ref 35–75)
HX LDL: 89 mg/dL (ref 0–129)
HX TRIGLYCERIDES: 62 mg/dL (ref 40–250)

## 2020-02-12 LAB — HX DIABETES: HX ALBUMIN RANDOM URINE: <0.5 mg/dL

## 2020-02-12 MED ORDER — LISINOPRIL: 1 | 90 | 0 refills | 0 days | Status: AC

## 2020-02-12 MED ORDER — INSULIN GLARGINE: 1 | 3 | 5 refills | 0 days | Status: AC

## 2020-02-12 MED ORDER — FLUOXETINE HCL: 1 | 90 | 1 refills | 0 days | Status: AC

## 2020-02-12 NOTE — Progress Notes (Signed)
 [facility] February 12, 2020  [address]   [place]  [place] [address]  Main: [phone]  Fax: [phone]  Patient Portal: [email or web address]            Authorization to [employer] Information  Please complete this form to have your records sent to your primary care doctor to use for your care.    From: ___[facility]/OBGYN   To: [name], MD    Provider Ph #:    [facility], Primary Care - [facility] Provider Fax #: [phone] ____   [address], [address]   [place], [place] [address]    Please indicate below the information you authorize to be released to [facility]  Patient Information:  Name: [name] Date of Birth: Feb 17, 1976 [facility] Medical Record # [id number]  Address: [address]  [place], [place]  [address]  Area Code/Telephone #: [phone]  Alternate #: __________________    Purpose of the Requested Use or Disclosure:  Medical Treatment or Transfer: _____    Legal: _____   Insurance: _____   Personal: _____  Other (please specify): ____________________     Continuity and coordination of care ____    Specific Description of Information (must include date(s)):  Date(s) of Treatment: _________________________________  ____ Complete Record ____ ER Record  ____ Discharge Summary  ____ Pathology Reports ____ Operative Report  ____ X-Ray/MRI/CT Scan Reports  ____ Lab Reports  ____ Clinic Visit Note  ____ Therapy (Physical/Occupational)  ____ Other: (please specify) _______________________________________________________  ____ Abstract of Record: (e.g. History & Physical, Operative & Discharge Reports, Consults, Lab Reports, ER Reports ? specify elements to be released) ___________________________________    Release of Specifically Protected Health Information  If the information described above includes information in any category below, I specifically authorize the use or disclosure of such information. Please  indicate the specific information to be used or disclosed and sign where indicated:   ____ HIV/AIDS testing/test results (patient authorization required for each release request)    Specify date: __________________     _________________________________________  February 12, 2020     Signature of Patient/Legal Representative  Date     Relationship to Patient or Authority to Act on Patients? behalf: ______________   ____ Genetic testing/test results    Specify date: ____________________   Specify type of test: ________________________________________________________     _________________________________________  February 12, 2020     Signature of Patient/Legal Representative  Date     Relationship to Patient or Authority to Act on Patients? behalf: ______________    Information identified in any category below:  Alcohol and drug abuse records   Specify dates: ____________________  Mental health treatment/psychotherapy  Specify dates: ____________________  Sexual assault counseling   Specify dates: ____________________  Social service counseling/therapy  Specify dates: ____________________  Venereal diseases/sexually-transmitted diseases Specify dates: ____________________     _________________________________________   February 12, 2020   Signature of Patient/Legal Representative   Date   Relationship to Patient or Authority to Act on Patients? behalf: _____________________    To Whom Information Will Be Disclosed. I authorize the above mentioned institution/provider to disclose copies of my protected health information as described above to: [facility], Primary Care - [place], Attention: [name], MD; [address], [place], [place] [address]    Expiration. This authorization will expire automatically in 6 months or on the following date or event that relates to me or  the purpose of the use or disclosure: _______________________________    Specific Understandings  I understand that I may revoke this authorization  by notifying the above mentioned institution/provider at any time in writing, but if I do it won?t have any affect on actions taken by above mentioned institution/provider before they received the revocation.    I may refuse to sign this authorization. My health care, the payment for my health care, and my health care benefits will not be affected if I do not sign this form (except if health care services are provided to me solely for the purpose of creating protected health information for disclosure to a third party). I have a right to receive a copy of this form after I have signed it.    By signing this authorization form, I authorize the use or disclosure of my protected health information as described above. I understand that information used or disclosed pursuant to this authorization may be disclosed by the recipient and may no longer be protected by federal or state law.    I have read this form and all of my questions about this form have been answered. By signing below, I acknowledge that I have read and accept all of the above.    _________________________________________   February 12, 2020  Signature of Patient/Legal Representative     Date  Relationship to Patient or Authority to Act on Patient?s Behalf: _____________________      Created By Roswell Miners on 02/12/2020 at 12:34 PM    Electronically Signed By [name] MD on 02/12/2020 at 01:43 PM

## 2020-02-12 NOTE — Progress Notes (Signed)
GAD-7: Anxiety Screening Tool   Over the last 2 weeks how often has the patient been bothered by the follow  ing problems:  1. Feel nervous, anxious or on edge:    1  2. Not being able to stop or control worrying:  1  3. Worrying too much about different things:    1  4. Trouble relaxing:    1  5. Being so restless that it is hard to sit still:  1  6. Becoming easily annoyed or irritable:    1  7. Feeling afraid as if something awful might happen:   1  Score: 7     Interpretation:  Mild  .  Previous Score: 6 (10/31/2018 8:15:54 AM)  Previous Interpretation: Mild (10/31/2018 8:15:54 AM)  .  Patient Health Questionnaire (PHQ-9)  1.  Over the last 2 weeks how often have you been bothered by any of the fo  llowing problems?            Not at All (0) -  Several Days (1) -  More Than Half the Days (2)   -  Nearly Every Day (3)  a. Little interest or pleasure in doing things...[name]. 1  b. Feeling down, depressed, or hopeless...[name].  1  c. Trouble falling/staying asleep, sleeping too much..... 1  d. Feeling tired or having little energy..... 2  e. Poor appetite or overeating..... 2  f. Feeling bad about yourself - or that you are a failure or have let yours  elf or your family down...[name]. 1  g. Trouble concentrating on things such as reading the newspaper or watchin  g television...[name]. 1  h. Moving or speaking so slowly that other people could have noticed.   Or   the opposite - being so fidgety or restless that you have been moving aroun  d a lot more than usual...[name]. 1  i. Thoughts that you would be better off dead or of hurting yourself in som  e way..... 0  Follow-Up PHQ-9: 10  .  Patient Health Questionnaire - [phone] Screening: 10 scored  General Medicine Visit  .  [name]  DIAB EYE EX, MICROALB/CRE, LDL, HGBA1C or HGBA1C%POC, PAP SMEAR.    .  * [facility]: [facility])  .  Patient Details and Vitals  Patient reviewed in/by:  Office  Patient Email Address [email or web address]  .  BMI: 39.11  .  BP: 157/ 87  Ht (inches): 64.5  Weight:  230.6  BMI: 39.11  Temp: 99.4  Pulse: 95  O2 Sat: 98  .  Call/Visit Details:   Medication List given.  HGBA1c- done in office  .  Med List: PRINTED by [place] for patient   hd_medl: printed  .  [name]  Patient Medical History   Travel outside of the [place] in past 28 days:: No  .  In the past year, have you ... Had no falls  Difficulty with balance? NO  Use a Cane NO  Use a Walker NO  Need assistance with ambulation while here? NO  .  [name]  Tobacco use? never smoker  Does patient experience chronic pain ? NO  .  Patient Screening   .  SDOH: Housing situation: I have housing  SDOH: Run out or ability to purchase food: Never true  SDOH: Utility shutoff: No  SDOH: Miss appt lack transportation: No  SDOH: unemployed or looking for work: No  SDOH: Socially withdrawn: Sometimes  .  .  .  PHQ2 (Q1) Little Interest or  pleasure in doing things: 1  PHQ2 (Q2) Feeling down, depressed or hopeless: 2  PHQ2 Score: 3  .  Abuse/Neglect (Q1) Feel unsafe in relationship: NO  Abuse/Neglect (Q2) Hurt in past year: NO  .  .  .  .  .  Data to be shared to Telemed/Office Visits   ......................................Marland KitchenNafisa Sheikh  February 12, 2020 11:11   AM  .  .  Patient Prep Updates   .  .  .  .  .  .  .  .  Chief Complaint:   44 year old for f/u mmp   History of Present Illness:   In the office 2-3 days/week; has regular hours   .  Anxiety/mild depression--worsening, would like treatment  is safe   PHQ 9 is 10  GAD is 7  gets overwhelmed/ would like medication   .  .  .  DM: with retinopathy  due eye care  high sugars after meals  called Jeanann Lewandowsky and did download CGM; in range 85% of the time  mostly taking 3 untis humalog with meals and most highs are after meals  .  .  .  .  .  .  .  .  Social History:   [place]; dad lived with them off and on; lived with mom, br  other;there was a time dad was cheating;   .  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  Diet:diabetic ; has lost 13 lbs; "could be a little better" still snacking/  /emotional eating  Exercise: could do more; now and then  Car safety-seatbelts/texting:discussed  .  Partner: FOB ; using condoms 90%//currently not SA  DV:no  STI history:    gc, chlamydia, hpv, herpes                    HIV testing:2016 tested  Contraception: condoms, gyn at [facility] upcoming to discuss reinsert iud   Abnormal pap history: 9/10 ascus; colpo neg; 12/11 colpo lsil; 6/12 ascus,   hpv not done; 4/13 pap neg, hr hpv pos, colpo neg; 5/14 pap/hpv neg;  pap M  ay 2015-neg pap, did not receive hpv results; thinks she had one 2019, will   try to get   .  Last CE: 5/15; 1/17; 4/18 ; 12/19  Pap: 5/15 pap neg (HPV sent but I didn't get result); 2019, need records fr  om [facility]  Mammo:ni; 8/17 on mammo van; order  Colo:ni  BMD:ni  Eye care:due 4/20  Dental: utd  Choleseterol:11/15 LDL over 100, rec'd statin  Risk Score Cholesterol: on high intensity statin  Tdap: 2012  MMR: vax 1978, 1994  Varicella:kid  Hep B:3 vax  Pvax:2011  Flu 2015, 2016;2019  Zoster:ni  HPV:ni  covid vax x 2  .  .  .  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  [name]  Medications (prior to today's visit):  LANTUS 100 UNIT/ML SUBCUTANEOUS SOLUTION (INSULIN GLARGINE) 45 units sq dai  ly  HUMALOG 100 UNITS/ML KWIKPEN (INSULIN LISPRO) USE 3 UNITS BEFORE EACH MEAL  HYDROCHLOROTHIAZIDE 25 MG TAB (HYDROCHLOROTHIAZIDE) TAKE 1 TABLET BY MOUTH   EVERY DAY  ATORVASTATIN 40 MG TABLET (ATORVASTATIN CALCIUM) TAKE 1 TABLET BY MOUTH EVE  RY DAY      * ACCUCHECK GLUCOMETER check sugars three times daily Dx: Diabetes type   II with retinopathy E11.319      RELION ALL-IN-ONE DEVICE (BLOOD GLUC METER DISP-STRIPS) Use three times   daily (whichever brand covered/with meter)      LANCETS (LANCETS) Use as directed.      INSULIN SYRINGE 30G X 1/2" 0.5 ML (INSULIN SYRINGE-NEEDLE U-100) use to   inject insulin daily under skin      BD DISP NEEDLES 30G X 1/2" (NEEDLE (DISP)) use small needle to inject i  nsulin      ACCU-CHEK AVIVA IN VITRO STRIP (GLUCOSE BLOOD) use 1-3 times a day   dx   E11 .319; Route: IN VITRO      FREESTYLE LIBRE 14 DAY SENSOR (CONTINUOUS BLOOD GLUC SENSOR) USE AS DIR  ECTED TO MONITOR BLOOD SUGAR . CHANGE EVERY 14 DAYS, E 11.9      FREESTYLE LIBRE 14 DAY READER DEVICE (CONTINUOUS BLOOD GLUC RECEIVER) U  se device as directed to test blood sugars. E11.9  .  Medications (after today's visit):  LANTUS 100 UNIT/ML SUBCUTANEOUS SOLUTION (INSULIN GLARGINE) 45 units sq dai  ly  HUMALOG 100 UNITS/ML KWIKPEN (INSULIN LISPRO) USE 3 UNITS BEFORE EACH MEAL  HYDROCHLOROTHIAZIDE 25 MG TAB (HYDROCHLOROTHIAZIDE) TAKE 1 TABLET BY MOUTH   EVERY DAY  ATORVASTATIN 40 MG TABLET (ATORVASTATIN CALCIUM) TAKE 1 TABLET BY MOUTH EVE  RY DAY      * ACCUCHECK GLUCOMETER check sugars  three times daily Dx: Diabetes type   II with retinopathy E11.319      RELION ALL-IN-ONE DEVICE (BLOOD GLUC METER DISP-STRIPS) Use three times   daily (whichever brand covered/with meter)      LANCETS (LANCETS) Use as directed.      INSULIN SYRINGE 30G X 1/2" 0.5 ML (INSULIN SYRINGE-NEEDLE U-100) use to   inject insulin daily under skin      BD DISP NEEDLES 30G X 1/2" (NEEDLE (DISP)) use small needle to inject i  nsulin      ACCU-CHEK AVIVA IN VITRO STRIP (GLUCOSE BLOOD) use 1-3 times a day   dx   E11 .319; Route: IN VITRO      FREESTYLE LIBRE 14 DAY SENSOR (CONTINUOUS BLOOD GLUC SENSOR) USE AS DIR  ECTED TO MONITOR BLOOD SUGAR . CHANGE EVERY 14 DAYS, E 11.9      FREESTYLE LIBRE 14 DAY READER DEVICE (CONTINUOUS BLOOD GLUC RECEIVER) U  se device as directed to test blood sugars. E11.9  FLUOXETINE HCL 20 MG ORAL CAPSULE (FLUOXETINE HCL) Take one capsule by mout  h once daily.; Route: ORAL  LISINOPRIL 10 MG ORAL  TABLET (LISINOPRIL) Take one tablet by mouth once dai  ly; Route: ORAL  .  [name]  .  Allergies (prior to today's visit):  PENICILLIN V POTASSIUM (PENICILLIN V POTASSIUM) (Severe)  .  .  .  .  .  .  Vitals:   Ht: 64.5 in.  Wt: 230.6 lbs.  BMI (in-lb) 39.11  Temp: 99.4deg F.     BP (Initial Bibb Screening): 157 / 87     BP (Rechecked, Actionable): 142 / 8  7 mmHg   Pulse Rate: 95 bpm O2 Sat: 98 %  .  [name]  Additional PE:   General: Well appearing  Head: NCAT  .  Eyes-anicteric  Neck: supple  Psych: affect and mood appropriate  .  [name]  Assessment /T/ Plan:   39 year ol dhrer for f/u MMP   .  [name]  DM uncontrolled with retinopathy     HGBA1C = 8.5 today, improved!   working on diet; advised 4-6 units humalog with meals  .  .           on  atorvastatin 40 mg ; adherence issue; recheck LDL            12/19 microalb neg ; check            refer eye   HTN  , bp not controlled   advissed ACE inhibitor   advised risk of pregnancy; she is getting IUD at GYN (currenlty not SA)  .  [name]  HLD, ldl 127 but poor adherence; recheck  .   Horner  syndrome-Chest CT negative, 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 Monitoring with   eye care   .   depression/reactivity/irritability  - restart fluoxetine, refer for counse  lling   .  poor sleep--consider sleep study   .  .  .  .  Likely PCOS (advised weight loss)  Likely hidradenitis-no active boils now; use antibacterial and soaks; consi  der cleocin gel if continued  pvax in future   pap done at [facility] 2019--will get results; ; discussed getting IUD with gyn   at [facility]     covid vax x 2 got    .  [name]  Orders (this visit):  Albumin, Random Urine  [CPT-82043]  Request for Medical Records [MSC-00000]  Other OB/GYN [Ref-OB]  [facility] [Ref-Eye]  RTC in 3 months [RTC-090]  CBC  [CPT-85027]  LYTES [SODIUM] [CPT-82495]  BUN [BUN] [CPT-84520]  Creatinine (CR) [CREATININE] [CPT-82565]  SGOT (AST) [SGOT (AST)] [CPT-84450]  SGPT (ALT) [SGPT (ALT)] [CPT-84460]  Alkaline Phosphatase (ALK) [ALK PHOS] [CPT-84075]  Bilirubin, Total [BILI TOTAL] [CPT-82247]  Lipid Profile-Chol, HDL, Trig, LDL(calc) [CHOLESTEROL] [CPT-80061]  Chlamydia [CPT-86631]  GC test [CPT-87591]  RPR [TREPONEMA AB] [CPT-86592]  HIV antibody (verbal consent) [ELISA HIV] [CPT-86703]  Hep C Ab (HEPC) [HEP C AB] [CPT-86803]  Iron (FE) [IRON] [CPT-83540]  Iron/Tibc [IRON] [CPT-83550]  Ferritin [FERRITIN] [CPT-82728]  Est Level 4 (MDM or 30-39 min) [GGY-69485]  .  Vital Signs   Height: 64.5 inches  Weight: 230.6 pounds  Temperature: 99.4 degrees  F   Pulse rate: 95  O2 Sat: 98  Blood Pressure #1: 142/87 mm Hg  Blood Pressure #2: 157/87 mm Hg  Body Mass Index: 39.11  .  Medications:  LANTUS 100 UNIT/ML SUBCUTANEOUS SOLUTION (INSULIN GLARGINE) 45 units sq dai  ly  #3[Bottle] x 5      Entered and Authorized by:  [name], MD      Signed by:  [name], MD on 02/12/2020      Method used:    Electronically to               [facility] (retail)              [address]              [address]              [place], [place]  [address]              Ph:  [phone]              Fax: [phone]      Note to Pharmacy: SAVINGS FOR NON-COVERED MEDICATIONS-For claims: BIN:0  03585 LOV:[id number] Group:[id number] JJ:[id number]; Questions:                     MedImpact [id number]       RxID:   [id number]  LISINOPRIL 10 MG ORAL TABLET (LISINOPRIL) Take one tablet by mouth once dai  ly  #90[Tablet] x 0      Route:ORAL      Entered and Authorized by:  [name], MD      Signed by:  [name], MD on 02/12/2020      Method used:    Electronically to               [facility] (retail)              [address]              [address]              [place], [place]  [address]              Ph: [phone]              Fax: [phone]      Note to Pharmacy: Route: ORAL;       RxID:   [id number]  FLUOXETINE HCL 20 MG ORAL CAPSULE (FLUOXETINE HCL) Take one capsule by mout  h once daily.  #90[Capsule] x 1      Route:ORAL      Entered and Authorized by:  [name], MD      Signed by:  [name], MD on 02/12/2020      Method used:    Electronically to               [facility] ([facility]ail[facility]              [address]              [address]              [place], [place]  [address]              Ph: [phone]              Fax: [phone]      Note to Pharmacy: Route: ORAL;SAVINGS FOR NON-COVERED MEDICATIONS-For c  laims: DPO:[id number] IRW:[id number] Group:[id number] QQ:[id number];                     Questions: MedImpact [phone]       RxID:   [id number]  .  [name]  Patient Care Plan  .  [name]  Immunization Worksheet 2019 (rev 02/11/2020)   .  .  .  .  .  .  .  .  .  .  .  .  [name]  Follow-up With:   .  .  [name]  Electronically Signed by [name], MD on 02/15/2020 at 12:46 PM  ________________________________________________________________________

## 2020-02-12 NOTE — Progress Notes (Signed)
Miscellaneous Note    Pt came in to see Dr.Tishler today and I downloaded a libre report for review.    Her time in range is 81 % which I praised her for. has some post prandial highs and some nighttime lows.  The lows she identifies with not eating which we discussed and the highs with mealtime.  We talked about the lows, she can identify when it is going low and knows how to correct.  See previous MD note for insulin adjustment with mealtime highs.  advised i have sent her an email so she can upload the [facility] for remote monitoring   A1c today is down to 8.5.  will continue to follow and check in with her ......................................Marland KitchenJeanann Lewandowsky RN  February 12, 2020 5:25 PM                ALLERGIES:  PENICILLIN V POTASSIUM (PENICILLIN V POTASSIUM) (Severe)      Plan   Medication List:   LANTUS 100 UNIT/ML SUBCUTANEOUS SOLUTION (INSULIN GLARGINE) 45 units sq daily  HUMALOG 100 UNITS/ML KWIKPEN (INSULIN LISPRO) USE 3 UNITS BEFORE EACH MEAL  HYDROCHLOROTHIAZIDE 25 MG TAB (HYDROCHLOROTHIAZIDE) TAKE 1 TABLET BY MOUTH EVERY DAY  ATORVASTATIN 40 MG TABLET (ATORVASTATIN CALCIUM) TAKE 1 TABLET BY MOUTH EVERY DAY      * ACCUCHECK GLUCOMETER check sugars three times daily Dx: Diabetes type II with retinopathy E11.319      RELION ALL-IN-ONE DEVICE (BLOOD GLUC METER DISP-STRIPS) Use three times daily (whichever brand covered/with meter)      LANCETS (LANCETS) Use as directed.      INSULIN SYRINGE 30G X 1/2" 0.5 ML (INSULIN SYRINGE-NEEDLE U-100) use to inject insulin daily under skin      BD DISP NEEDLES 30G X 1/2" (NEEDLE (DISP)) use small needle to inject insulin      ACCU-CHEK AVIVA IN VITRO STRIP (GLUCOSE BLOOD) use 1-3 times a day   dx E11 .319; Route: IN VITRO      FREESTYLE LIBRE 14 DAY SENSOR (CONTINUOUS BLOOD GLUC SENSOR) USE AS DIRECTED TO MONITOR BLOOD SUGAR . CHANGE EVERY 14 DAYS, E 11.9      FREESTYLE LIBRE 14 DAY READER DEVICE (CONTINUOUS BLOOD GLUC RECEIVER) Use device as directed to test blood sugars.  E11.9  FLUOXETINE HCL 20 MG ORAL CAPSULE (FLUOXETINE HCL) Take one capsule by mouth once daily.; Route: ORAL  LISINOPRIL 10 MG ORAL TABLET (LISINOPRIL) Take one tablet by mouth once daily; Route: ORAL                  Created By Jeanann Lewandowsky RN on 02/12/2020 at 12:28 PM    Electronically Signed By Jeanann Lewandowsky RN on 02/12/2020 at 05:29 PM

## 2020-02-13 LAB — HX MOLECULAR BIO
HX CHLAMYDIA DNA PROBE: NEGATIVE
HX CHLAMYDIA DNA PROBE: NEGATIVE
HX GC DNA PROBE: NEGATIVE
HX GC DNA PROBE: NEGATIVE

## 2020-02-13 LAB — HX CHLAMYDIA: HX CHLAMYDIA DNA PROBE: NEGATIVE

## 2020-02-28 ENCOUNTER — Ambulatory Visit

## 2020-02-28 NOTE — Progress Notes (Signed)
 [facility]  [address]  [place] [address]  Main: [phone]  Fax: [phone]  Patient Portal: [email or web address]      February 28, 2020            MRN: [id number]            DOB: 1976-02-14   [name]   [address]   [place] [address]      The results of your recent tests are as follows:      Your iron is still mildly low. I would recommend taking iron every other day or a multivitamin with iron to build your stores up. We can discuss at your next visit further work up .      Cholesterol Tests:      Total Cholesterol   143  Normal 110-199    02/12/2020    HDL (good cholesterol)  42  Normal >40     02/12/2020    Triglyceride    62  Normal 40-250    02/12/2020    LDL (bad cholesterol)   89  Normal <160    02/12/2020     Diabetes Tests:      HgbA1c:    8.5  Normal 4.3 - 5.6    02/12/2020    HgbA1c POC:    8.5 %  Normal 4.3 - 5.6    02/12/2020    Microalb/Cre    Not Calculated mg/g  Normal 0 - 30    02/12/2020     Blood Count Tests:      Hematocrit:    37.0  Normal 32-45 women, 37-47 men  02/12/2020    Hemoglobin:    11.7  Normal 13.5-16 female, 62-15 female 02/12/2020    White Blood Cells:   7.9  Normal 4 - 11    02/12/2020    Platelets:    340  Normal 150-400    02/12/2020    MCV:     91.8  Normal 80-96    02/12/2020    Ferritin:    16  Normal 10-240 women, 22-277 men 02/12/2020    Iron:     50  Normal 37-170 women 49-181 men 02/12/2020    TIBC:     423  Normal 253-463    02/12/2020     Liver Tests:      ALT:     13  Normal 0 - 54    02/12/2020    AST:     16  Normal 10 - 42    02/12/2020    Total Bili:    0.2  Normal 0.2 - 1.1    02/12/2020    Alk Phos:    126  Normal 40 - 130    02/12/2020     Hepatitis Tests:      Hepatitis C antibody:   Non Reactive  Normal=Nonreactive  02/12/2020     Kidney Tests:      BUN:     11  Normal 6 - 24    02/12/2020    Creatinine:    0.93  Normal 0.57 - 1.30   02/12/2020    GFR (Non  African-American):  74  Normal >= 60    02/12/2020    GFR (African-American):  86  Normal >= 60    02/12/2020    Microalb/Cre    Not Calculated mg/g  Normal 0 - 30    02/12/2020     Electrolyte  Tests:      Sodium:    143 MEQ/L  Normal 135-145   02/12/2020    Potassium:    4.5 MEQ/L  Normal 3.6-5.1   02/12/2020    Chloride:    107 MEQ/L  Normal 98-110   02/12/2020    Bicarbonate:    29 MEQ/L  Normal 20 - 30   02/12/2020    Anion Gap:    7   Normal 5 - 18    02/12/2020     ID Tests:      HIV     Non Reactive  Normal=Nonreactive  02/12/2020    Syphilis:    Non Reactive  Normal=Nonreactive  02/12/2020    Gonorrhea (GC):   Negative  Normal=Negative  02/12/2020    Chlamydia (Chl):   Negative  Normal=Negative  02/12/2020       Sincerely,       [name], MD  [facility]  [phone]          Created By [name] MD on 02/28/2020 at 06:48 AM    Electronically Signed By [name] MD on 02/28/2020 at 06:49 AM

## 2020-06-07 ENCOUNTER — Ambulatory Visit

## 2020-06-07 NOTE — Telephone Encounter (Signed)
 Call Details:   Patient PCP = [name] MD  [name] (Patient) called on June 07, 2020 9:34 AM.  Message taken by: Sunday Shams  Primary call-back number: [phone]    Secondary call-back number: () -    Call Reason(s): Message/Call-Back      ** MESSAGE / CALL-BACK.  Regarding: pt requesting callback would like to know if her apt on 08/05 could be changed to a TV apt     ---------- ---------- ---------- ---------- ---------- ----------       RESPONSE/ORDERS:    ask her if I can do TV with her today at 11:30 am .and also does she have a blood pressure cuff to take her blood pressure? ....................................[name] MD  June 07, 2020 9:59 AM    Pt states she can do today at 11:30am. Pt states she does not have a BP cuff. ........................................Marland KitchenScott [facility] Hospital [place]  June 07, 2020 11:12 AM  She isn't picking up. i realived 8/5 appt with Stephanie--Stephanie, can you make it a TV on 8/5? I'd love to get a repeat BP on her though .......................................[name] MD  June 07, 2020 11:36 AM    Unfortunatley BP kit isn't covered by her insurance. She would need to buy over the counter.  We can do it as a TL - Shawntae can you change it to a P9 and let her know? Thanks! ......................................Marland KitchenNeta Ehlers, PharmD  June 07, 2020 12:33 PM    Appt has been changed and pt is aware. 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) (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)  HYPERTENSION (ICD-401.9) (ICD10-I10)  ANEMIA (ICD-285.9) (ICD10-D64.9)  HIDRADENITIS (ICD-705.83) (ICD10-L73.2)  SHORTNESS OF BREATH (ICD-786.05) (ICD10-R06.02)  ANNUAL EXAM (ICD-V72.31) (ICD10-Z00.00)  DEPRESSION (ICD-311) (ICD10-F32.9)      ANXIETY (ICD-300.00) (ICD10-F41.9)  ABNORMAL  PAP SMEAR 12/11 LSIL; COLPO 12/11; BX CIN I; 6/12 ASCUS PAP; 4/13 PAP NEG; HR HPV POS, NEG COLPO; 5/14 PAP/HR HPV NEG (ICD-795.00) (ICD10-R87.619)  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)  PATELLAR DISLOCATION-MEDIAL RETINACULAR TEAR; RIGHT KNEE (ICD-836.3) (ICD10-S83.006)  OBESITY (ICD-278.00) (ICD10-E66.9)  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)  SEXUALLY TRANSMITTED DISEASE, EXPOSURE TO-H/O GC, TRICH, HPV, CHLAMYDIA (ICD-V01.6) (ICD10-Z20.2)  S/P CHOLECYSTECTOMY (ICD-V45.79) (ICD10-Z90.49)    Allergies:   PENICILLIN V POTASSIUM (PENICILLIN V POTASSIUM) (Severe)    Meds (prior to this call):   LANTUS 100 UNIT/ML SUBCUTANEOUS SOLUTION (INSULIN GLARGINE) 45 units sq daily  HUMALOG 100 UNITS/ML KWIKPEN (INSULIN LISPRO) USE 3 UNITS BEFORE EACH MEAL  HYDROCHLOROTHIAZIDE 25 MG TAB (HYDROCHLOROTHIAZIDE) TAKE 1 TABLET BY MOUTH EVERY DAY  ATORVASTATIN 40 MG TABLET (ATORVASTATIN CALCIUM) TAKE 1 TABLET BY MOUTH EVERY DAY      * ACCUCHECK GLUCOMETER check sugars three times daily Dx: Diabetes type II with retinopathy E11.319      RELION ALL-IN-ONE DEVICE (BLOOD GLUC METER DISP-STRIPS) Use three times daily (whichever brand covered/with meter)      LANCETS (LANCETS) Use as directed.      INSULIN SYRINGE 30G X 1/2" 0.5 ML (INSULIN SYRINGE-NEEDLE U-100) use to inject insulin daily under skin      BD DISP NEEDLES 30G X 1/2" (NEEDLE (DISP)) use small needle to inject insulin      ACCU-CHEK AVIVA IN VITRO STRIP (GLUCOSE BLOOD) use 1-3 times a day   dx E11 .319; Route: IN VITRO  FREESTYLE LIBRE 14 DAY SENSOR (CONTINUOUS BLOOD GLUC SENSOR) USE AS DIRECTED TO MONITOR BLOOD SUGAR . CHANGE EVERY 14 DAYS, E 11.9      FREESTYLE LIBRE 14 DAY READER DEVICE (CONTINUOUS BLOOD GLUC RECEIVER) Use device as directed to test blood sugars. E11.9  FLUOXETINE HCL 20 MG ORAL CAPSULE (FLUOXETINE HCL) Take one capsule by mouth once daily.; Route: ORAL  LISINOPRIL 10 MG ORAL TABLET (LISINOPRIL) Take one tablet by mouth once daily; Route:  ORAL            Created By Sunday Shams on 06/07/2020 at 09:34 AM    Electronically Signed By [name] MD on 06/09/2020 at 11:14 AM

## 2020-06-10 ENCOUNTER — Ambulatory Visit

## 2020-06-10 ENCOUNTER — Ambulatory Visit: Admitting: Ambulatory Care

## 2020-06-10 ENCOUNTER — Ambulatory Visit: Admit: 2020-06-10 | Payer: HMO

## 2020-06-10 MED ORDER — INSULIN GLARGINE: 1 | 15 | 3 refills | 0 days | Status: AC

## 2020-06-10 MED ORDER — SEMAGLUTIDE: 1 | 1 | 0 refills | 0 days | Status: AC

## 2020-06-10 MED ORDER — INSULIN LISPRO: 1 | 5 | 11 refills | 0 days | Status: AC

## 2020-06-10 MED ORDER — LISINOPRIL: 1 | 90 | 0 refills | 0 days | Status: AC

## 2020-06-10 MED ORDER — HYDROCHLOROTHIAZIDE: 1 | 90 | 1 refills | 0 days | Status: AC

## 2020-06-10 MED ORDER — INSULIN PEN NEEDLE: 100 | 11 refills | 0 days | Status: AC

## 2020-06-10 NOTE — Progress Notes (Signed)
 Pharmacist Visit  .  Hi Dr. [name], planning on starting Ozempic today. She feelsmotivated to   getting some better control in her BG and hoping this will allow [name] to elim  inate her Humalog dosing.   .  Can you also determine if we should refill lisinopril, HCTZ, fluoxetine? Se  e note for details. ......................................Marland KitchenNeta Ehlers,   PharmD  June 10, 2020 2:43 PM  .  will keep on lisinopril  would be good to get bp check/machine  will restart hctz--d/w pt, pt had had extra pills   will hold off on fluoxetine unless she wants  (pt states didn't help) .....  ..................................[name] MD  June 10, 2020 3:50 PM  .  Medications:  LISINOPRIL 10 MG ORAL TABLET (LISINOPRIL) Take one tablet by mouth once dai  ly  #90[Tablet] x 0      Route:ORAL      Entered and Authorized by:  [name] MD      Signed by:  [name] MD on 06/10/2020      Method used:    Electronically to               [facility]  [address]  [address]  [place], [place]  [address]  Ph: [phone]  Fax: [phone]      Note to Pharmacy: Route: ORAL;       RxID:   [id number]  OZEMPIC (0.25 OR 0.5 MG/DOSE) 2 MG/1.5ML SUBCUTANEOUS SOLUTION PEN-INJECTO   (SEMAGLUTIDE) Inject 0.25 mg sc weekly  #1[Prefilled Pen Syrnge] x 0      Route:SUBCUTANEOUS      Entered by: Neta Ehlers, PharmD      Authorized by:  [name] MD      Signed by:  [name] MD on 06/10/2020      Method used:    [facility]  [address] [place], [place]  [address]  Ph: [phone]  Fax: [phone]      Note to Pharmacy: Route: SUBCUTANEOUS;       RxID:   [id number]  HUMALOG 100 UNITS/ML KWIKPEN (INSULIN LISPRO) USE 6 UNITS BEFORE EACH MEAL    #5[Prefilled Pen Syrnge] x 11      Entered by: Neta Ehlers, PharmD      Authorized by:  [name] MD      Signed by:  [name] MD on 06/10/2020      Method used:    Electronically to               [facility]  [address] [place], [place]  [address]  Ph: [phone]  Fax: [phone]      RxID:   [id number]  PEN NEEDLES 32G X 4 MM (INSULIN PEN NEEDLE) Use as directed to administer m  edication from pen 4x/day  #100[Pen Needle] x 11      Entered by: Neta Ehlers, PharmD      Authorized by:  [name] MD      Signed by:  [name] MD on 06/10/2020      Method used:    Electronically to               [facility]  [address] [place], [place]  [address]  Ph: [phone]  Fax: [phone]  RxID:   [id number]  LANTUS SOLOSTAR 100 UNIT/ML SUBCUTANEOUS SOLUTION PEN-INJECTOR (INSULIN GLA  RGINE) Inject 45 units sc daily  #15[Prefilled Pen Syrnge] x 3      Route:SUBCUTANEOUS      Entered by: Neta Ehlers, PharmD      Authorized by:  [name] MD      Signed by:  [name] MD on 06/10/2020      Method used:    Electronically to               [facility]  [address] [place], [place]  [address]  Ph: [phone]  Fax: [phone]      Note to Pharmacy: Route: SUBCUTANEOUS;       RxID:   [id number]  .  MicroAlb/Cre = Not Calculated mg/g     (02/12/2020)     (Normal = 0-30)  .  DIAB EYE EX, HGBA1C or HGBA1C%POC, BP DIASTOLIC, PAP SMEAR.    .  * [place]: Pharmacist  .  Patient Details and Vitals  Patient reviewed in/by:  TeleMedicine  Visit Type: Video Visit  Patient Email Address [email or web address]  .  .  .  .  .  Patient Medical History   .  .  .  COVID Patient Symptom Screening   Note any symptoms in the prior 10 days:   In past 14 days, exposure to anyone with above symptoms or positive test?   In past 14 days, told by public health authoirty or healthcare professional   to quarantine?   COVID Vaccine; remind availability for walk-in vaccines. If NO, patient mus  t wear mask..   .  Patient Screening   .  .  .  .  .  .  .  .  .  .  Data to be shared to Telemed/Office Visits   .  [name]  Patient Prep Updates   .  .  .  .  .  .  .  .  Chief Complaint:   Patient presents to re- establish clinical  pharmacy services for for collab  orative management of diabetes. Pt is a 44 year old [name] with PMH signifi  cant for: diabetes, hypertension, obesity  History of Present Illness:   A lot going on lately but wants to get diabetes under better control. Feels   frustrated with her care here and the refill management..   .  - Last visit, lisinopril was started. No side effects  .  DIABETES  DM Hx  - Duration:  since /R/45 years old  - Endocrinologist: only when pregnant  .  Current meds: Lantus 45 units daily, Humalog 6 before meals, 3 units with s  nacks -   Previously tried: metformin 10+ years ago, upset stomach  .  HGBA1C = 8.5     (02/12/2020)     (Normal = 4.3-5.8)   A1c 13.3 (10/2018)   .  Hypoglycemia: no s/sx  Meter: Livongo; Freestyle Libre (just put on this AM)  Testing BG:   restarted today  Home BG readings:   - Fasting today 200;   .  DM Complications  Eyes:  Last Ophtho visit: DUE; Retinopathy: yes  Renal: Cr/eGFR: ; MicroAlb/Cre = Not Calculated mg/g     (02/12/2020)     (  Normal = 0-30)    ASA: No  BP: started on ACEi last visit, plans to pick up BP cuff  Lipids: atorva 40, non-adherent  LIFESTYLE  Diet: binge eating, sleeping 3-4 hrs/night  .  MED MANAGEMENT   Logician, Dr. Tiajuana Amass, and [facility] med lists reviewed. Patient is a good histori  an of medications and indications. The following discrepancies were discove  red:    - stopped taking fluoxetine after 2 months, felt it made her feel numb and   not wanting to do anything, didn't think it helped her anxiety  - Lisinopril ran out last month, no refills   - Out of HCTZ (last filled 08/05/2020 for 90 day supply)   - Atorvastatin last filled 08/22/2020 90 day supply  .  .  .  .  .  .  .  .  .  .  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) (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)  HYPERTENSION (ICD-401.9) (ICD10-I10)  ANEMIA (ICD-285.9)  (ICD10-D64.9)  HIDRADENITIS (ICD-705.83) (ICD10-L73.2)  SHORTNESS OF BREATH (ICD-786.05) (ICD10-R06.02)  ANNUAL EXAM (ICD-V72.31) (ICD10-Z00.00)  DEPRESSION (ICD-311) (ICD10-F32.9)      ANXIETY (ICD-300.00) (ICD10-F41.9)  ABNORMAL PAP SMEAR 12/11 LSIL; COLPO 12/11; BX CIN I; 6/12 ASCUS PAP; 4/13   PAP NEG; HR HPV POS, NEG COLPO; 5/14 PAP/HR HPV NEG (ICD-795.00) (ICD10-R87  .619)  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  .  .  Medications (prior to today's visit):  LANTUS 100 UNIT/ML SUBCUTANEOUS SOLUTION (INSULIN GLARGINE) 45 units sq dai  ly  HUMALOG 100 UNITS/ML KWIKPEN (INSULIN LISPRO) USE 3 UNITS BEFORE EACH MEAL  HYDROCHLOROTHIAZIDE 25 MG TAB (HYDROCHLOROTHIAZIDE) TAKE 1 TABLET BY MOUTH   EVERY DAY  ATORVASTATIN 40 MG TABLET (ATORVASTATIN CALCIUM) TAKE 1 TABLET BY MOUTH EVE  RY DAY      * ACCUCHECK GLUCOMETER check sugars three times daily Dx: Diabetes type   II with retinopathy E11.319      RELION ALL-IN-ONE DEVICE (BLOOD GLUC METER DISP-STRIPS) Use three times   daily (whichever brand covered/with meter)      LANCETS (LANCETS) Use as directed.      INSULIN SYRINGE 30G X 1/2" 0.5 ML (INSULIN SYRINGE-NEEDLE U-100) use to   inject insulin daily under skin      BD DISP NEEDLES 30G X 1/2" (NEEDLE (DISP)) use small needle to inject i  nsulin      ACCU-CHEK AVIVA IN VITRO STRIP (GLUCOSE BLOOD) use 1-3 times a day   dx   E11 .319; Route: IN VITRO      FREESTYLE LIBRE 14 DAY SENSOR (CONTINUOUS BLOOD GLUC SENSOR) USE AS DIR  ECTED TO MONITOR BLOOD SUGAR . CHANGE EVERY 14 DAYS, E 11.9      FREESTYLE LIBRE 14 DAY READER DEVICE (CONTINUOUS BLOOD GLUC RECEIVER) U  se device as directed to test blood  sugars. E11.9  FLUOXETINE HCL 20 MG ORAL CAPSULE (FLUOXETINE HCL) Take one capsule by mout  h once daily.; Route: ORAL  LISINOPRIL 10 MG ORAL TABLET (LISINOPRIL) Take one tablet by mouth once dai  ly; Route: ORAL  .  Medications (after today's visit):  LANTUS SOLOSTAR 100 UNIT/ML SUBCUTANEOUS SOLUTION PEN-INJECTOR (INSULIN GLA  RGINE) Inject 45 units sc daily; Route: SUBCUTANEOUS  HUMALOG 100 UNITS/ML KWIKPEN (INSULIN LISPRO) USE 6 UNITS BEFORE EACH MEAL  HYDROCHLOROTHIAZIDE 25 MG TAB (HYDROCHLOROTHIAZIDE) TAKE 1 TABLET BY MOUTH  EVERY DAY  ATORVASTATIN 40 MG TABLET (ATORVASTATIN CALCIUM) TAKE 1 TABLET BY MOUTH EVE  RY DAY      * ACCUCHECK GLUCOMETER check sugars three times daily Dx: Diabetes type   II with retinopathy E11.319      RELION ALL-IN-ONE DEVICE (BLOOD GLUC METER DISP-STRIPS) Use three times   daily (whichever brand covered/with meter)      LANCETS (LANCETS) Use as directed.      PEN NEEDLES 32G X 4 MM (INSULIN PEN NEEDLE) Use as directed to administ  er medication from pen 4x/day      ACCU-CHEK AVIVA IN VITRO STRIP (GLUCOSE BLOOD) use 1-3 times a day   dx   E11 .319; Route: IN VITRO      FREESTYLE LIBRE 14 DAY SENSOR (CONTINUOUS BLOOD GLUC SENSOR) USE AS DIR  ECTED TO MONITOR BLOOD SUGAR . CHANGE EVERY 14 DAYS, E 11.9      FREESTYLE LIBRE 14 DAY READER DEVICE (CONTINUOUS BLOOD GLUC RECEIVER) U  se device as directed to test blood sugars. E11.9  FLUOXETINE HCL 20 MG ORAL CAPSULE (FLUOXETINE HCL) Take one capsule by mout  h once daily.; Route: ORAL  LISINOPRIL 10 MG ORAL TABLET (LISINOPRIL) Take one tablet by mouth once dai  ly; Route: ORAL  OZEMPIC (0.25 OR 0.5 MG/DOSE) 2 MG/1.5ML SUBCUTANEOUS SOLUTION PEN-INJECTO   (SEMAGLUTIDE) Inject 0.25 mg sc weekly; Route: SUBCUTANEOUS  .  Medications Reviewed:  Done  .  [name]  Allergies (prior to today's visit):  PENICILLIN V POTASSIUM (PENICILLIN V POTASSIUM) (Severe)  .  .  .  .  .  .  Vitals:   Ht: 64.5 in.    .  .  .  .  .  Assessment /T/ Plan:   DIABETES  A1c of  8.5% 02/2020 not at goal of A1c < 7% per ADA guidelines, improved fro  m 13.3 (10/2018).  Interested in focusing on managing diabetes again, suppo  rt provided and goal of helping make it easier for her to manage longterm.   Many stressors in her life make it difficult. Discussed starting Ozempic fo  r CV, weight loss, and potentially eliminating Humalog injection benefits,   which she is interested in. Patient education on Ozempic dosing, indication  , storage, side effects, and administration provided. While we have minimal   readings, can still start Ozempic dose has 0.25mg  is solely for tolerabili  ty and has minimal impact on glycemic control. Will also switch Lantus vial   to pen for easier administration.   .  Consider restarting metformin at future visit. Patient agreed to see if she   could link her phone with LibreView. She knows what she needs to do with d  iet, will start to work on it but did not focus on this today.  .  Plan:   1. Start Ozempic 0.25 mg weekly, copay card instructions provided.  2. Continue Lantus 45 units daily, Humalog 6 units with meals and 3 units w  ith snacks,   3. Encouraged her to check blood sugars fasting and before each meal, and q  8hrs.  .  MED MANAGEMENT/ADHERENCE  Patient has fair adherence until medications run out. Will clarify if fluxo  etine should be changed to an alternate medicine or resumed, will discuss w  ith PCP.  Will confirm with PCP before sending refills as lisinopril was ad  ded (likely not taking HCTZ at that time based on refill history). Tolerate  d lisinopril with no  complaints of SE, has not checked BP but agrees to pur  chase new BP monitor.    .  1. Change Lantus to pen formulation  2. F/u with PCP re lisinopril/HCTZ/Fluoxetine refills.  .  PharmD f/u:  8/31 at 10 am [facility]  Dr [name] appt 9/28  .  [name]  Orders (this visit):  No billing charge [MSC-[id number]]  .  .  .  Electronically Signed by Neta Ehlers, PharmD on 06/10/2020 at 3:58  PM  ________________________________________________________________________

## 2020-06-30 ENCOUNTER — Ambulatory Visit

## 2020-06-30 NOTE — Telephone Encounter (Signed)
 Portal Medical Question, (GMA) [facility] [place], [name], MD  Send a Message to My Provider/Team  Provider  [facility]) [facility] [place] - [name], MD   Do not use this form for Emergencies. If you are having an emergency, call 911 now.     Subject:  Other   Details:  I HAVE BEEN UNABLE TO OBTAIN AN REFERRAL FOR OUTSIDE OF [place] AND HAVE NOT BEEN ABLE TO SPEAK OR RECEIVE ANY UPDATE REGARDING MY REQUEST. UNFORTUNATELY, I WILL HAVE TO CHANGE MY CARE PROVIDER SO THAT THIS DOES NOT REMAIN AN ONGOING ISSUE/CONCERN WHEN I REQUIRE A REFERRAL.   Disclaimer     If this is a medical emergency please call 911. Messages submitted will be processed by our staff within 48 business hours.   If you need immediate assistance please call the practice.      Note: These are specially coded messages to be directed to your Care Team, and will not be visible in the Sent folder of your Inbox.           Created By  LinkLogic on 06/30/2020 at 11:49 AM    Electronically Signed By [name] MD on 07/04/2020 at 04:25 PM

## 2020-07-04 ENCOUNTER — Ambulatory Visit

## 2020-07-04 NOTE — Telephone Encounter (Signed)
 Call Details:   Other  I HAVE BEEN UNABLE TO OBTAIN AN REFERRAL FOR OUTSIDE OF [place] AND HAVE NOT BEEN ABLE TO SPEAK OR RECEIVE ANY UPDATE REGARDING MY REQUEST. UNFORTUNATELY, I WILL HAVE TO CHANGE MY CARE PROVIDER SO THAT THIS DOES NOT REMAIN AN ONGOING ISSUE/CONCERN WHEN I REQUIRE A REFERRAL.    This came in on portal. Can you address? .......................................[name] MD  July 04, 2020 4:24 PM        RESPONSE/ORDERS:  Called and lmom for patient to leave the all the referral information that she has left on the referral line (assuming she has left the information on the referral line)....she can leave the information me and i'll send the request to the referral department on-line for her.............    PHONE TEAM if patient calls back .[name].. 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) (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)  HYPERTENSION (ICD-401.9) (ICD10-I10)  ANEMIA (ICD-285.9) (ICD10-D64.9)  HIDRADENITIS (ICD-705.83) (ICD10-L73.2)  SHORTNESS OF BREATH (ICD-786.05) (ICD10-R06.02)  ANNUAL EXAM (ICD-V72.31) (ICD10-Z00.00)  DEPRESSION (ICD-311) (ICD10-F32.9)      ANXIETY (ICD-300.00) (ICD10-F41.9)  ABNORMAL PAP SMEAR 12/11 LSIL; COLPO 12/11; BX CIN I; 6/12 ASCUS PAP; 4/13 PAP NEG; HR HPV POS, NEG COLPO; 5/14 PAP/HR HPV NEG (ICD-795.00) (ICD10-R87.619)  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)  PATELLAR DISLOCATION-MEDIAL RETINACULAR TEAR; RIGHT KNEE (ICD-836.3) (ICD10-S83.006)  OBESITY (ICD-278.00) (ICD10-E66.9)  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)  SEXUALLY TRANSMITTED DISEASE, EXPOSURE TO-H/O GC, TRICH, HPV, CHLAMYDIA (ICD-V01.6) (ICD10-Z20.2)  S/P CHOLECYSTECTOMY (ICD-V45.79)  (ICD10-Z90.49)    Allergies:   PENICILLIN V POTASSIUM (PENICILLIN V POTASSIUM) (Severe)    Meds (prior to this call):   OZEMPIC (0.25 OR 0.5 MG/DOSE) 2 MG/1.5ML SUBCUTANEOUS SOLUTION PEN-INJECTO (SEMAGLUTIDE) Inject 0.25 mg sc weekly; Route: SUBCUTANEOUS  LANTUS SOLOSTAR 100 UNIT/ML SUBCUTANEOUS SOLUTION PEN-INJECTOR (INSULIN GLARGINE) Inject 45 units sc daily; Route: SUBCUTANEOUS  HUMALOG 100 UNITS/ML KWIKPEN (INSULIN LISPRO) USE 6 UNITS BEFORE EACH MEAL  HYDROCHLOROTHIAZIDE 25 MG TAB (HYDROCHLOROTHIAZIDE) TAKE 1 TABLET BY MOUTH EVERY DAY  ATORVASTATIN 40 MG TABLET (ATORVASTATIN CALCIUM) TAKE 1 TABLET BY MOUTH EVERY DAY      * ACCUCHECK GLUCOMETER check sugars three times daily Dx: Diabetes type II with retinopathy E11.319      RELION ALL-IN-ONE DEVICE (BLOOD GLUC METER DISP-STRIPS) Use three times daily (whichever brand covered/with meter)      LANCETS (LANCETS) Use as directed.      PEN NEEDLES 32G X 4 MM (INSULIN PEN NEEDLE) Use as directed to administer medication from pen 4x/day      ACCU-CHEK AVIVA IN VITRO STRIP (GLUCOSE BLOOD) use 1-3 times a day   dx E11 .319; Route: IN VITRO      FREESTYLE LIBRE 14 DAY SENSOR (CONTINUOUS BLOOD GLUC SENSOR) USE AS DIRECTED TO MONITOR BLOOD SUGAR . CHANGE EVERY 14 DAYS, E 11.9      FREESTYLE LIBRE 14 DAY READER DEVICE (CONTINUOUS BLOOD GLUC RECEIVER) Use device as directed to test blood sugars. E11.9  FLUOXETINE HCL 20 MG ORAL CAPSULE (FLUOXETINE HCL) Take one capsule by mouth once daily.; Route: ORAL  LISINOPRIL 10 MG ORAL TABLET (LISINOPRIL) Take one tablet  by mouth once daily; Route: ORAL            Created By [name] MD on 07/04/2020 at 04:24 PM    Electronically Signed By [name] MD on 07/13/2020 at 01:59 PM

## 2020-07-06 ENCOUNTER — Ambulatory Visit

## 2020-07-06 ENCOUNTER — Ambulatory Visit: Admitting: Ambulatory Care

## 2020-07-06 ENCOUNTER — Ambulatory Visit: Admit: 2020-07-06 | Payer: HMO

## 2020-07-06 NOTE — Progress Notes (Signed)
 Pharmacist Visit  .  [name]  DIAB EYE EX, HGBA1C or HGBA1C%POC, BP DIASTOLIC, PAP SMEAR,PHQ-9 SCORE.    .  * [place]: Pharmacist  .  Patient Details and Vitals  Patient reviewed in/by:  TeleMedicine  Visit Type: Video Visit  Patient Email Address [email or web address]  .  .  .  .  .  Patient Medical History   .  .  .  COVID Patient Symptom Screening   Note any symptoms in the prior 10 days:   In past 14 days, exposure to anyone with above symptoms or positive test?   In past 14 days, told by public health authoirty or healthcare professional   to quarantine?   COVID Vaccine; remind availability for walk-in vaccines. If NO, patient mus  t wear mask..   .  Patient Screening   .  .  .  .  .  .  .  .  .  .  Data to be shared to Telemed/Office Visits   .  [name]  Patient Prep Updates   .  .  .  .  .  .  .  .  Chief Complaint:   Patient presents to clinic for follow up for collaborative management of di  abetes with Dr [name].  Pt is a 44 year old female with PMH significant fo  r: uncontrolled type 2 diabetes with retinopathy, hypertension, anxiety/dep  ression   History of Present Illness:   Feeling frustrated regarding her [place] referrals, has not heard back yet   .  Since last visit, started Ozempic x 2, stopped Humalog by accident. Has som  e diarrhea and nausea, resolves in a few days. Ordered wrist BP cuff, on it  s way.  [name]  DIABETES  DM Hx  - Duration:  since /R/44 years old  - Endocrinologist: only when pregnant  .  Current meds: Lantus 45 units daily, Humalog 6 before meals, 3 units with s  nacks, Ozempic 0.25 mg weekly on Tues (started 8/24)  Previously tried: metformin 10+ years ago, upset stomach  .  HGBA1C = 8.5     (02/12/2020)     (Normal = 4.3-5.8)   A1c 13.3 (10/2018)   .  Hypoglycemia: no s/sx  Meter: Livongo; Freestyle Libre with reader  Testing BG:   2x/day  Home BG readings:   - Fasting today 245  Fastings 151, 184, 209, 227, 245   2 hr post lunch 196  .  DM Complications  Eyes:  Last Ophtho visit: DUE - scheduled  for 08/05/2020; Retinopathy: yes  Renal: Cr/eGFR: ; MicroAlb/Cre = Not Calculated mg/g     (02/12/2020)     (  Normal = 0-30)    ASA: No  BP: started on ACEi last visit, plans to pick up BP cuff  Lipids: atorva 40 - reports taking (previously non-adherent)  LIFESTYLE  Diet: binge eating, sleeping 3-4 hrs/night; "trying"  - salad with chicken and rice (portioned)  .  MED MANAGEMENT   Logician, Dr. Tiajuana Amass, and [facility] med lists reviewed. Patient is a good histori  an of medications and indications.   Missed Lantus x 1  The following discrepancies were discovered:    - Atorvastatin last filled 08/22/2020 90 day supply - states has been takin  g lately, previously non-adherent  - not taking fluoxetine, did not like, on hold for now til pcp appt  .  .  .  .  .  .  .  .  .  .  .  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) (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)  HYPERTENSION (ICD-401.9) (ICD10-I10)  ANEMIA (ICD-285.9) (ICD10-D64.9)  HIDRADENITIS (ICD-705.83) (ICD10-L73.2)  SHORTNESS OF BREATH (ICD-786.05) (ICD10-R06.02)  ANNUAL EXAM (ICD-V72.31) (ICD10-Z00.00)  DEPRESSION (ICD-311) (ICD10-F32.9)      ANXIETY (ICD-300.00) (ICD10-F41.9)  ABNORMAL PAP SMEAR 12/11 LSIL; COLPO 12/11; BX CIN I; 6/12 ASCUS PAP; 4/13   PAP NEG; HR HPV POS, NEG COLPO; 5/14 PAP/HR HPV NEG (ICD-795.00) (ICD10-R87  .619)  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  .  .  Medications (prior to today's visit):  OZEMPIC (0.25 OR 0.5 MG/DOSE) 2 MG/1.5ML SUBCUTANEOUS SOLUTION PEN-INJECTO   (SEMAGLUTIDE) Inject 0.25 mg sc weekly; Route:  SUBCUTANEOUS  LANTUS SOLOSTAR 100 UNIT/ML SUBCUTANEOUS SOLUTION PEN-INJECTOR (INSULIN GLA  RGINE) Inject 45 units sc daily; Route: SUBCUTANEOUS  HUMALOG 100 UNITS/ML KWIKPEN (INSULIN LISPRO) USE 6 UNITS BEFORE EACH MEAL  HYDROCHLOROTHIAZIDE 25 MG TAB (HYDROCHLOROTHIAZIDE) TAKE 1 TABLET BY MOUTH   EVERY DAY  ATORVASTATIN 40 MG TABLET (ATORVASTATIN CALCIUM) TAKE 1 TABLET BY MOUTH EVE  RY DAY      * ACCUCHECK GLUCOMETER check sugars three times daily Dx: Diabetes type   II with retinopathy E11.319      RELION ALL-IN-ONE DEVICE (BLOOD GLUC METER DISP-STRIPS) Use three times   daily (whichever brand covered/with meter)      LANCETS (LANCETS) Use as directed.      PEN NEEDLES 32G X 4 MM (INSULIN PEN NEEDLE) Use as directed to administ  er medication from pen 4x/day      ACCU-CHEK AVIVA IN VITRO STRIP (GLUCOSE BLOOD) use 1-3 times a day   dx   E11 .319; Route: IN VITRO      FREESTYLE LIBRE 14 DAY SENSOR (CONTINUOUS BLOOD GLUC SENSOR) USE AS DIR  ECTED TO MONITOR BLOOD SUGAR . CHANGE EVERY 14 DAYS, E 11.9      FREESTYLE LIBRE 14 DAY READER DEVICE (CONTINUOUS BLOOD GLUC RECEIVER) U  se device as directed to test blood sugars. E11.9  FLUOXETINE HCL 20 MG ORAL CAPSULE (FLUOXETINE HCL) Take one capsule by mout  h once daily.; Route: ORAL  LISINOPRIL 10 MG ORAL TABLET (LISINOPRIL) Take one tablet by mouth once dai  ly; Route: ORAL  .  No Changes to Medication List   Medications Reviewed:  Done  .  [name]  Allergies (prior to today's visit):  PENICILLIN V POTASSIUM (PENICILLIN V POTASSIUM) (Severe)  .  .  .  .  .  .  Vitals:   Ht: 64.5 in.    .  .  .  .  .  Assessment /T/ Plan:   DIABETES  A1c of 8.5% 02/2020 not at goal of A1c < 7% per ADA guidelines, improved fro  m 13.3 (10/2018). Patient mistakenly stopped Humalog when she started Ozemp  ic. Blood sugars are higher than previous as expected with not taking Humal  og. Not experiencing any hypoglycemia. Has some nausea and diarrhea with Oz  empic, but is tolerable. Wants to continue  with dosing to see if it improve  s.   .  Consider restarting metformin at future visit. Patient agreed to see if she   could link her phone with LibreView or via  meter connection, invite sent.   She knows what she needs to do with diet, will start to work on it but did   not focus on this today.  .  Plan:   1. Continue Ozempic 0.25 mg weekly,  Lantus 45 units daily  2. Resume Humalog 6 units with meals and 3 units with snacks  3. Encouraged her to check blood sugars fasting and before each meal, and q  8hrs, will set up LibreView account  .  MED MANAGEMENT/ADHERENCE  Patient has fair adherence until medications run out.  BP monitor is ordere  d. Adherence has been improved lately, will monitor atorvastatin and other   med refills going forward.  .  PharmD f/u:  9/14 at 2:30 pm [facility] per patient request  Dr [name] appt 9/28   .  [name]  Orders (this visit):  No billing charge [MSC-[id number]]  .  .  .  Electronically Signed by Neta Ehlers, PharmD on 07/06/2020 at 11:55 AM  ________________________________________________________________________

## 2020-07-06 NOTE — Progress Notes (Signed)
 Pharmacist Visit      DIAB EYE EX, HGBA1C or HGBA1C%POC, BP DIASTOLIC, PAP SMEAR, PHQ-9 SCORE.      * [name]: Pharmacist    Patient Details and Vitals  Patient reviewed in/by:  TeleMedicine  Visit Type: Video Visit  Patient Email Address [email or web address]            Patient Medical History         COVID Patient Symptom Screening   Note any symptoms in the prior 10 days:   In past 14 days, exposure to anyone with above symptoms or positive test?   In past 14 days, told by public health authoirty or healthcare professional to quarantine?   COVID Vaccine; remind availability for walk-in vaccines. If NO, patient must wear mask..     Patient Screening                       Data to be shared to Telemed/Office Visits          Patient Prep Updates                   Chief Complaint:   Patient presents to clinic for follow up for collaborative management of diabetes with Dr [name].  Pt is a 44 year old female with PMH significant for: uncontrolled type 2 diabetes with retinopathy, hypertension, anxiety/depression   History of Present Illness:   Feeling frustrated regarding her [facility] referrals, has not heard back yet     Since last visit, started Ozempic x 2, stopped Humalog by accident. Has some diarrhea and nausea, resolves in a few days. Ordered wrist BP cuff, on its way.    DIABETES  DM Hx  - Duration:  since ~44 years old  - Endocrinologist: only when pregnant    Current meds: Lantus 45 units daily, Humalog 6 before meals, 3 units with snacks, Ozempic 0.25 mg weekly on Tues (started 8/24)  Previously tried: metformin 10+ years ago, upset stomach    HGBA1C = 8.5     (02/12/2020)     (Normal = 4.3-5.8)   A1c 13.3 (10/2018)     Hypoglycemia: no s/sx  Meter: [place]; Freestyle Libre with reader  Testing BG:   2x/day  Home BG readings:   - Fasting today 245  Fastings 151, 184, 209, 227, 245   2 hr post lunch 196    DM Complications  Eyes:  Last Ophtho visit: DUE - scheduled for 08/05/2020; Retinopathy: yes  Renal: Cr/eGFR: ;  MicroAlb/Cre = Not Calculated mg/g     (02/12/2020)     (Normal = 0-30)    ASA: No  BP: started on ACEi last visit, plans to pick up BP cuff  Lipids: atorva 40 - reports taking (previously non-adherent)      LIFESTYLE  Diet: binge eating, sleeping 3-4 hrs/night; "trying"  - salad with chicken and rice (portioned)    MED MANAGEMENT   Logician, Dr. Tiajuana Amass, and [facility] med lists reviewed. Patient is a good historian of medications and indications.   Missed Lantus x 1  The following discrepancies were discovered:    - Atorvastatin last filled 08/22/2020 90 day supply - states has been taking lately, previously non-adherent  - not taking fluoxetine, did not like, on hold for now til pcp appt                          Past Medical History (prior to today's visit):  DEPRESSION, MILD, RECURRENT (ICD-296.31) (ICD10-F33.0)  DIABETES MELLITUS, TYPE II, 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) (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)  HYPERTENSION (ICD-401.9) (ICD10-I10)  ANEMIA (ICD-285.9) (ICD10-D64.9)  HIDRADENITIS (ICD-705.83) (ICD10-L73.2)  SHORTNESS OF BREATH (ICD-786.05) (ICD10-R06.02)  ANNUAL EXAM (ICD-V72.31) (ICD10-Z00.00)  DEPRESSION (ICD-311) (ICD10-F32.9)      ANXIETY (ICD-300.00) (ICD10-F41.9)  ABNORMAL PAP SMEAR 12/11 LSIL; COLPO 12/11; BX CIN I; 6/12 ASCUS PAP; 4/13 PAP NEG; HR HPV POS, NEG COLPO; 5/14 PAP/HR HPV NEG (ICD-795.00) (ICD10-R87.619)  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)  PATELLAR DISLOCATION-MEDIAL RETINACULAR TEAR; RIGHT KNEE (ICD-836.3) (ICD10-S83.006)  OBESITY (ICD-278.00) (ICD10-E66.9)  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)  SEXUALLY TRANSMITTED DISEASE, EXPOSURE TO-H/O GC, TRICH, HPV, CHLAMYDIA (ICD-V01.6) (ICD10-Z20.2)  S/P CHOLECYSTECTOMY (ICD-V45.79) (ICD10-Z90.49)           Medications (prior to today's visit):  OZEMPIC (0.25 OR 0.5 MG/DOSE) 2 MG/1.5ML SUBCUTANEOUS SOLUTION PEN-INJECTO (SEMAGLUTIDE) Inject 0.25 mg sc weekly; Route: SUBCUTANEOUS  LANTUS SOLOSTAR 100 UNIT/ML SUBCUTANEOUS SOLUTION PEN-INJECTOR (INSULIN  GLARGINE) Inject 45 units sc daily; Route: SUBCUTANEOUS  HUMALOG 100 UNITS/ML KWIKPEN (INSULIN LISPRO) USE 6 UNITS BEFORE EACH MEAL  HYDROCHLOROTHIAZIDE 25 MG TAB (HYDROCHLOROTHIAZIDE) TAKE 1 TABLET BY MOUTH EVERY DAY  ATORVASTATIN 40 MG TABLET (ATORVASTATIN CALCIUM) TAKE 1 TABLET BY MOUTH EVERY DAY      * ACCUCHECK GLUCOMETER check sugars three times daily Dx: Diabetes type II with retinopathy E11.319      RELION ALL-IN-ONE DEVICE (BLOOD GLUC METER DISP-STRIPS) Use three times daily (whichever brand covered/with meter)      LANCETS (LANCETS) Use as directed.      PEN NEEDLES 32G X 4 MM (INSULIN PEN NEEDLE) Use as directed to administer medication from pen 4x/day      ACCU-CHEK AVIVA IN VITRO STRIP (GLUCOSE BLOOD) use 1-3 times a day   dx E11 .319; Route: IN VITRO      FREESTYLE LIBRE 14 DAY SENSOR (CONTINUOUS BLOOD GLUC SENSOR) USE AS DIRECTED TO MONITOR BLOOD SUGAR . CHANGE EVERY 14 DAYS, E 11.9      FREESTYLE LIBRE 14 DAY READER DEVICE (CONTINUOUS BLOOD GLUC RECEIVER) Use device as directed to test blood sugars. E11.9  FLUOXETINE HCL 20 MG ORAL CAPSULE (FLUOXETINE HCL) Take one capsule by mouth once daily.; Route: ORAL  LISINOPRIL 10 MG ORAL TABLET (LISINOPRIL) Take one tablet by mouth once daily; Route: ORAL    No Changes to Medication List   Medications Reviewed:  Done      Allergies (prior to today's visit):  PENICILLIN V POTASSIUM (PENICILLIN V POTASSIUM) (Severe)                 Vitals:   Ht: 64.5 in.              Assessment & Plan:   DIABETES  A1c of 8.5% 02/2020 not at goal of A1c < 7% per ADA guidelines, improved from 13.3 (10/2018). Patient mistakenly stopped Humalog when she started Ozempic. Blood sugars are higher than previous as expected with not taking Humalog. Not experiencing any hypoglycemia. Has some nausea and diarrhea with Ozempic, but is tolerable. Wants to continue with dosing to see if it improves.     Consider restarting metformin at future visit. Patient agreed to see if she could link her  phone with LibreView or via meter connection, invite sent. She knows what she needs to do with diet, will start to work on it but did not focus on this today.    Plan:   1. Continue Ozempic 0.25 mg weekly,  Lantus 45 units daily  2. Resume  Humalog 6 units with meals and 3 units with snacks  3. Encouraged her to check blood sugars fasting and before each meal, and q8hrs, will set up LibreView account    MED MANAGEMENT/ADHERENCE  Patient has fair adherence until medications run out.  BP monitor is ordered. Adherence has been improved lately, will monitor atorvastatin and other med refills going forward.    PharmD f/u:  9/14 at 2:30 pm [facility] per patient request  Dr [name] appt 9/28       Orders (this visit):  No billing charge [[id number]]        Created By Neta Ehlers PharmD on 07/06/2020 at 10:02 AM    Electronically Signed By Neta Ehlers PharmD on 07/06/2020 at 11:55 AM

## 2020-07-18 ENCOUNTER — Emergency Department (HOSPITAL_COMMUNITY): Payer: Medicaid Other

## 2020-07-18 ENCOUNTER — Emergency Department (HOSPITAL_COMMUNITY)
Admission: EM | Admit: 2020-07-18 | Discharge: 2020-07-18 | Disposition: A | Discharge status: Home / Self Care | Payer: Medicaid Other | Attending: Emergency Medicine | Admitting: Emergency Medicine

## 2020-07-18 ENCOUNTER — Encounter (HOSPITAL_COMMUNITY): Payer: Self-pay | Admitting: Emergency Medicine

## 2020-07-18 ENCOUNTER — Other Ambulatory Visit: Payer: Self-pay

## 2020-07-18 DIAGNOSIS — Z20822 Contact with and (suspected) exposure to covid-19: Secondary | ICD-10-CM | POA: Diagnosis not present

## 2020-07-18 DIAGNOSIS — R05 Cough: Secondary | ICD-10-CM | POA: Diagnosis not present

## 2020-07-18 DIAGNOSIS — Z5321 Procedure and treatment not carried out due to patient leaving prior to being seen by health care provider: Secondary | ICD-10-CM | POA: Insufficient documentation

## 2020-07-18 DIAGNOSIS — R0602 Shortness of breath: Secondary | ICD-10-CM | POA: Diagnosis present

## 2020-07-18 DIAGNOSIS — R430 Anosmia: Secondary | ICD-10-CM | POA: Insufficient documentation

## 2020-07-18 LAB — CBC WITH DIFFERENTIAL/PLATELET
Abs Immature Granulocytes: 0.01 10*3/uL (ref 0.00–0.07)
Basophils Absolute: 0 10*3/uL (ref 0.0–0.1)
Basophils Relative: 1 %
Eosinophils Absolute: 0.5 10*3/uL (ref 0.0–0.5)
Eosinophils Relative: 10 %
HCT: 25.3 % — ABNORMAL LOW (ref 36.0–46.0)
Hemoglobin: 6.6 g/dL — CL (ref 12.0–15.0)
Immature Granulocytes: 0 %
Lymphocytes Relative: 29 %
Lymphs Abs: 1.6 10*3/uL (ref 0.7–4.0)
MCH: 17.1 pg — ABNORMAL LOW (ref 26.0–34.0)
MCHC: 26.1 g/dL — ABNORMAL LOW (ref 30.0–36.0)
MCV: 65.5 fL — ABNORMAL LOW (ref 80.0–100.0)
Monocytes Absolute: 0.5 10*3/uL (ref 0.1–1.0)
Monocytes Relative: 8 %
Neutro Abs: 2.8 10*3/uL (ref 1.7–7.7)
Neutrophils Relative %: 52 %
Platelets: 302 10*3/uL (ref 150–400)
RBC: 3.86 MIL/uL — ABNORMAL LOW (ref 3.87–5.11)
RDW: 20.7 % — ABNORMAL HIGH (ref 11.5–15.5)
WBC: 5.5 10*3/uL (ref 4.0–10.5)
nRBC: 0 % (ref 0.0–0.2)

## 2020-07-18 LAB — SARS CORONAVIRUS 2 BY RT PCR (HOSPITAL ORDER, PERFORMED IN ~~LOC~~ HOSPITAL LAB): SARS Coronavirus 2: NEGATIVE

## 2020-07-18 LAB — COMPREHENSIVE METABOLIC PANEL
ALT: 13 U/L (ref 0–44)
AST: 20 U/L (ref 15–41)
Albumin: 3.6 g/dL (ref 3.5–5.0)
Alkaline Phosphatase: 38 U/L (ref 38–126)
Anion gap: 8 (ref 5–15)
BUN: 7 mg/dL (ref 6–20)
CO2: 24 mmol/L (ref 22–32)
Calcium: 8.8 mg/dL — ABNORMAL LOW (ref 8.9–10.3)
Chloride: 107 mmol/L (ref 98–111)
Creatinine, Ser: 0.59 mg/dL (ref 0.44–1.00)
GFR calc Af Amer: >60 mL/min (ref >60)
GFR calc non Af Amer: >60 mL/min (ref >60)
Glucose, Bld: 101 mg/dL — ABNORMAL HIGH (ref 70–99)
Potassium: 4.2 mmol/L (ref 3.5–5.1)
Sodium: 139 mmol/L (ref 135–145)
Total Bilirubin: 0.3 mg/dL (ref 0.3–1.2)
Total Protein: 6.7 g/dL (ref 6.5–8.1)

## 2020-07-18 NOTE — ED Triage Notes (Signed)
Patient with shortness of breath, cough that has been going on for 2-3days.  She states that it is getting progressively worse over the time.  She denies any fever, no loss of taste, but she does not have the sense of smell at this time.

## 2020-07-20 ENCOUNTER — Ambulatory Visit

## 2020-07-20 NOTE — Telephone Encounter (Signed)
 RESPONSE/ORDERS:    Patient no showed for [facility] appt. Called patient No answer, LVM. Call back number provided. Also reminded her to call back with referall information. Tammy Sours, can you please reschedule her? Thanks! ......................................Marland KitchenNeta Ehlers, PharmD  July 20, 2020 2:52 PM   This patient has not been answering the phone. I tried calling several time but no answer. i left a message for pt to call us back and i sent pt a letter at home.  ......................................Marland KitchenThurmon Fair  July 20, 2020 4:28 PM    Thanks! ......................................Marland KitchenNeta Ehlers, PharmD  July 20, 2020 5:00 PM             ORDERS/PROBS/MEDS/ALL     Problems:   DEPRESSION, MILD, RECURRENT (ICD-296.31) (ICD10-F33.0)  DIABETES MELLITUS, TYPE II, 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) (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)  HYPERTENSION (ICD-401.9) (ICD10-I10)  ANEMIA (ICD-285.9) (ICD10-D64.9)  HIDRADENITIS (ICD-705.83) (ICD10-L73.2)  SHORTNESS OF BREATH (ICD-786.05) (ICD10-R06.02)  ANNUAL EXAM (ICD-V72.31) (ICD10-Z00.00)  DEPRESSION (ICD-311) (ICD10-F32.9)      ANXIETY (ICD-300.00) (ICD10-F41.9)  ABNORMAL PAP SMEAR 12/11 LSIL; COLPO 12/11; BX CIN I; 6/12 ASCUS PAP; 4/13 PAP NEG; HR HPV POS, NEG COLPO; 5/14 PAP/HR HPV NEG (ICD-795.00) (ICD10-R87.619)  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)  PATELLAR DISLOCATION-MEDIAL RETINACULAR TEAR; RIGHT KNEE (ICD-836.3) (ICD10-S83.006)  OBESITY (ICD-278.00) (ICD10-E66.9)  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)  SEXUALLY TRANSMITTED DISEASE, EXPOSURE TO-H/O GC, TRICH, HPV, CHLAMYDIA (ICD-V01.6) (ICD10-Z20.2)  S/P CHOLECYSTECTOMY (ICD-V45.79) (ICD10-Z90.49)    Allergies:   PENICILLIN V POTASSIUM (PENICILLIN V POTASSIUM) (Severe)    Meds (prior to this call):   OZEMPIC (0.25 OR 0.5 MG/DOSE) 2 MG/1.5ML SUBCUTANEOUS SOLUTION PEN-INJECTO (SEMAGLUTIDE) Inject 0.25 mg sc weekly; Route: SUBCUTANEOUS  LANTUS SOLOSTAR 100 UNIT/ML SUBCUTANEOUS SOLUTION PEN-INJECTOR  (INSULIN GLARGINE) Inject 45 units sc daily; Route: SUBCUTANEOUS  HUMALOG 100 UNITS/ML KWIKPEN (INSULIN LISPRO) USE 6 UNITS BEFORE EACH MEAL  HYDROCHLOROTHIAZIDE 25 MG TAB (HYDROCHLOROTHIAZIDE) TAKE 1 TABLET BY MOUTH EVERY DAY  ATORVASTATIN 40 MG TABLET (ATORVASTATIN CALCIUM) TAKE 1 TABLET BY MOUTH EVERY DAY      * ACCUCHECK GLUCOMETER check sugars three times daily Dx: Diabetes type II with retinopathy E11.319      RELION ALL-IN-ONE DEVICE (BLOOD GLUC METER DISP-STRIPS) Use three times daily (whichever brand covered/with meter)      LANCETS (LANCETS) Use as directed.      PEN NEEDLES 32G X 4 MM (INSULIN PEN NEEDLE) Use as directed to administer medication from pen 4x/day      ACCU-CHEK AVIVA IN VITRO STRIP (GLUCOSE BLOOD) use 1-3 times a day   dx E11 .319; Route: IN VITRO      FREESTYLE LIBRE 14 DAY SENSOR (CONTINUOUS BLOOD GLUC SENSOR) USE AS DIRECTED TO MONITOR BLOOD SUGAR . CHANGE EVERY 14 DAYS, E 11.9      FREESTYLE LIBRE 14 DAY READER DEVICE (CONTINUOUS BLOOD GLUC RECEIVER) Use device as directed to test blood sugars. E11.9  FLUOXETINE HCL 20 MG ORAL CAPSULE (FLUOXETINE HCL) Take one capsule by mouth once daily.; Route: ORAL  LISINOPRIL 10 MG ORAL TABLET (LISINOPRIL) Take one tablet by mouth once daily; Route: ORAL            Created By Neta Ehlers PharmD on 07/20/2020 at 02:51 PM    Electronically Signed By Neta Ehlers PharmD on 07/20/2020 at 05:00 PM

## 2020-07-26 ENCOUNTER — Ambulatory Visit

## 2020-08-03 ENCOUNTER — Ambulatory Visit: Admitting: Internal Medicine

## 2020-08-03 ENCOUNTER — Ambulatory Visit

## 2020-08-03 ENCOUNTER — Ambulatory Visit: Admit: 2020-08-03 | Payer: HMO

## 2020-08-03 LAB — HX POINT OF CARE: HX HGB A1C, POC: 9 % — ABNORMAL HIGH

## 2020-08-03 MED ORDER — CONTINUOUS BLOOD GLUC SENSOR: 8 | 3 refills | 0 days | Status: AC

## 2020-08-03 MED ORDER — INSULIN GLARGINE: 1 | 15 | 3 refills | 0 days | Status: AC

## 2020-08-03 MED ORDER — SEMAGLUTIDE: 0.5 | 3 | 3 refills | 0 days | Status: AC

## 2020-08-03 MED ORDER — FLUOXETINE HCL: 1 | 30 | 3 refills | 0 days | Status: AC

## 2020-08-03 NOTE — Progress Notes (Signed)
 General Medicine Visit  .  [name]  DIAB EYE EX, HGBA1C or HGBA1C%POC, HGBA1C or HGBA1C%POC, PAP SMEAR.    .  * [place]: [place] ([place])  .  Patient Details and Vitals  Patient reviewed in/by:  Office  Patient Email Address [email or web address]  .  BMI: 38.37  .  BP: 138/ 78  Ht (inches): 64.5  with shoes  Weight: 226.2  BMI: 38.37  Temp: 98.2  Pulse: 87  O2 Sat: 100  .  Med List: PRINTED by [place] for patient   hd_medl: printed  .  [name]  Patient Medical History   Travel outside of the [place] in past 28 days:: No  .  In the past year, have you ... Had no falls  Difficulty with balance? NO  Use a Cane NO  Use a Walker NO  Need assistance with ambulation while here? NO  .  [name]  COVID Patient Symptom Screening   Note any symptoms in the prior 10 days:   In past 14 days, exposure to anyone with above symptoms or positive test?   COVID-19 Exposure: No  In past 14 days, told by public health authoirty or healthcare professional   to quarantine?   COVID-19 Quarantine: No  COVID Vaccine; remind availability for walk-in vaccines. If NO, patient mus  t wear mask..   Been vaccinated for COVID-19: Yes  Tobacco use? never smoker  Does patient experience chronic pain ? NO  .  Patient Screening   .  SDOH: Housing situation: I have housing  SDOH: Run out or ability to purchase food: Never true  SDOH: Utility shutoff: No  SDOH: Miss appt lack transportation: No  SDOH: unemployed or looking for work: No  SDOH: Socially withdrawn: Never  .  .  .  PHQ2 (Q1) Little Interest or pleasure in doing things: 3  PHQ2 (Q2) Feeling down, depressed or hopeless: 2  PHQ2 Score: 5  .  Abuse/Neglect (Q1) Feel unsafe in relationship: NO  Abuse/Neglect (Q2) Hurt in past year: NO  .  .  .  .  .  Data to be shared to Telemed/Office Visits   August 03, 2020 3:40 PM  Screening [place]: Fatima Blank [facility])  PHQ2 Score: 5  .  ---------- ---------- ----------   ......................................Heidi KitchenDaleen Snook  August 03, 2020 3:  40 PM  .  .  .  .  Patient Prep Updates   [place]  Pre-Visit Notes:  August 03, 2020 3:40 PM  Screening [place]: Fatima Blank [facility])  PHQ2 Score: 5  .  ---------- ---------- ----------   ......................................Heidi KitchenDaleen Snook  August 03, 2020 3:  40 PM  .  .  .  .  .  .  .  .  .  Chief Complaint:   44 year old for annual with uncontrolled diabetes and fatigue   History of Present Illness:   Is having a hard time getting referrals and is very frustrated. Still wants   to be seen over at [facility] for GYN. Called Verlon Au supervisor today   during visit to get this taken care of for her  .  Going through some stuff, does feel down sometimes. is safe. just hard to a  lways be an adult.   .  controlled DM: A1c today is 9.0  now on ozempic .25 mg; had  more nausea but  now subsided  using lantus 45 units  back in the office/not always eating correctly/not alwasy giving humalog    FS have been high, would  ilke CGM again (was limited by eczema overall)  after lunchc 110 today   no lows   has [facility] app[facility] tomorrow at [facility]   .  fatigue  not sleeping well  snoring  AM headache  poor concentration and irritability  falls asleep when [name] naps and tired at work   .  Current Problems:   DEPRESSION, MILD, RECURRENT  stressed, going through things; "try " to be a  n adult about these things and it is so hard for her; never started or trie  d the fluoxetine   .  HYPERTENSION on HCTZ  ANEMIA   HIDRADENITIS   .  [name]  HORNER'S SYNDROME   ABNORMAL PAP SMEAR  .  PATELLAR DISLOCATION-MEDIAL RETINACULAR TEAR; RIGHT KNEE  OBESITY  HSV-RARE BREAKOUTS   SEXUALLY TRANSMITTED DISEASE, EXPOSURE TO-H/O GC, TRICH, HPV, CHLAMYDIA  S/P CHOLECYSTECTOMY   .  .  .  .  .  .  .  Family History:   father-deceased, dm, chf, prostate cancer,   mother-alive-dm,   brother-no med problems  one daughter   .  Social History:   [place] the [place]; dad lived with them off and on; lived with mom, br  other;there was a time dad was cheating;   .  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  Diet:diabetic ; going out at work/eating what isn't supposed to   Exercise: could do more; now and then  Car safety-seatbelts/texting:discussed  .  Partner: FOB ; using condoms 90%//currently not SA  DV:no  STI history:    gc, chlamydia, hpv, herpes                    HIV testing:2016 tested  Contraception: condoms, gyn at [facility] upcoming to discuss reinsert iud   Abnormal pap history: 9/10 ascus; colpo neg; 12/11 colpo lsil; 6/12 ascus,   hpv not done; 4/13 pap neg, hr hpv pos, colpo neg; 5/14 pap/hpv neg;  pap M  ay 2015-neg pap, did not receive hpv results; thinks she had one 2019, will   try to get   .  Last CE: 5/15; 1/17; 4/18 ; 12/19; 9/21   Pap: 5/15 pap neg (HPV sent but I didn't get result); 2019,overdue for pap   Mammo:ni; 8/17 on mammo van; order  Colo:ni  BMD:ni  Eye care:due 4/20; has appt 9/21  Dental: utd  Choleseterol:11/15 LDL over 100, rec'd statin  Risk Score Cholesterol: on high intensity statin  Tdap: 2012  MMR: vax 1978, 1994  Varicella:kid  Hep B:3 vax  Pvax:2011  Flu 2015, 2016;2019  Zoster:ni  HPV:ni  covid vax x 2  .  .  ROS:  General: Denies fevers, chills, sweats, weight loss,   Eyes: Denies vision loss.   Ears/Nose/Throat: Denies earache, tinnitus, sore throat, dysphagia.   Cardiovascular: Denies chest pains, palpitations, dyspnea on exertion, orth  opnea, PND, peripheral edema.   Respiratory: Denies cough, dyspnea, hemoptysis, wheezing.   Gastrointestinal: Denies nausea, vomiting, diarrhea, constipation, change i  n bowel habits, abdominal pain, hematochezia, heartburn.  Gyn/GU-negative   Musculoskeletal: Denies back pain, joint pain, joint swelling.   Skin: Denies rash  Neurologic: Denies weakness or memory issues  Psychiatric: see above   Endocrine: Denies cold  intolerance, heat intolerance,  polydipsia, polyphagi  a, polyuria.   Heme/Lymphatic: Denies abnormal bruising. Sleep: snores, some fatigue, only   sleeping 4-5 hours/night; disrupted sleep.   .  .  .  Past Medical History (prior to today's visit):  DEPRESSION, MILD, RECURRENT (ICD-296.31) (ICD10-F33.0)  DIABETES MELLITUS, TYPE II, 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) (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)  HYPERTENSION (ICD-401.9) (ICD10-I10)  ANEMIA (ICD-285.9) (ICD10-D64.9)  HIDRADENITIS (ICD-705.83) (ICD10-L73.2)  SHORTNESS OF BREATH (ICD-786.05) (ICD10-R06.02)  ANXIETY (ICD-300.00) (ICD10-F41.9)  DEPRESSION (ICD-311) (ICD10-F32.9)  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)  ABNORMAL PAP SMEAR 12/11 LSIL; COLPO 12/11; BX CIN I; 6/12 ASCUS PAP; 4/13   PAP NEG; HR HPV POS, NEG COLPO; 5/14 PAP/HR HPV NEG (ICD-795.00) (ICD10-R87  .619)  ANNUAL EXAM (ICD-V72.31) (ICD10-Z00.00)  PATELLAR DISLOCATION-MEDIAL RETINACULAR TEAR; RIGHT KNEE (ICD-836.3) (ICD10  -S83.006)  OBESITY (ICD-278.00) (ICD10-E66.9)  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  Past Medical History (changes today):  Removed problem of DEPRESSION (ICD-311) (ICD10-F32.9)  Added new problem of FATIGUE (ICD-780.79) (ICD10-R53.83)  .  [name]  Medications (prior to today's visit):  OZEMPIC (0.25 OR 0.5 MG/DOSE) 2 MG/1.5ML SUBCUTANEOUS SOLUTION PEN-INJECTO   (SEMAGLUTIDE) Inject 0.25 mg sc weekly; Route: SUBCUTANEOUS  LANTUS SOLOSTAR 100 UNIT/ML SUBCUTANEOUS SOLUTION PEN-INJECTOR (INSULIN GLA  RGINE) Inject 45 units sc daily; Route: SUBCUTANEOUS  HUMALOG 100 UNITS/ML KWIKPEN (INSULIN LISPRO) USE 6 UNITS BEFORE EACH MEAL  HYDROCHLOROTHIAZIDE 25 MG TAB (HYDROCHLOROTHIAZIDE) TAKE 1 TABLET BY MOUTH   EVERY DAY  ATORVASTATIN 40 MG TABLET (ATORVASTATIN CALCIUM) TAKE 1 TABLET BY MOUTH EVE  RY DAY  LISINOPRIL 10 MG ORAL TABLET (LISINOPRIL) Take one tablet by mouth once dai  ly;  Route: ORAL  FLUOXETINE HCL 20 MG ORAL CAPSULE (FLUOXETINE HCL) Take one capsule by mout  h once daily.; Route: ORAL      * ACCUCHECK GLUCOMETER check sugars three times daily Dx: Diabetes type   II with retinopathy E11.319      RELION ALL-IN-ONE DEVICE (BLOOD GLUC METER DISP-STRIPS) Use three times   daily (whichever brand covered/with meter)      LANCETS (LANCETS) Use as directed.      PEN NEEDLES 32G X 4 MM (INSULIN PEN NEEDLE) Use as directed to administ  er medication from pen 4x/day      ACCU-CHEK AVIVA IN VITRO STRIP (GLUCOSE BLOOD) use 1-3 times a day   dx   E11 .319; Route: IN VITRO      FREESTYLE LIBRE 14 DAY SENSOR (CONTINUOUS BLOOD GLUC SENSOR) USE AS DIR  ECTED TO MONITOR BLOOD SUGAR . CHANGE EVERY 14 DAYS, E 11.9      FREESTYLE LIBRE 14 DAY READER DEVICE (CONTINUOUS BLOOD GLUC RECEIVER) U  se device as directed to test blood sugars. E11.9  .  Medications (after today's visit):  OZEMPIC (0.25 OR 0.5 MG/DOSE) 2 MG/1.5ML SUBCUTANEOUS SOLUTION PEN-INJECTO   (SEMAGLUTIDE) Inject 0.5  mg sc weekly; Route: SUBCUTANEOUS  LANTUS SOLOSTAR 100 UNIT/ML SUBCUTANEOUS SOLUTION PEN-INJECTOR (INSULIN GLA  RGINE) Inject 45 units sc daily; Route: SUBCUTANEOUS  HUMALOG 100 UNITS/ML KWIKPEN (INSULIN LISPRO) USE 6 UNITS BEFORE EACH MEAL  HYDROCHLOROTHIAZIDE 25 MG TAB (HYDROCHLOROTHIAZIDE) TAKE 1 TABLET BY MOUTH   EVERY DAY  ATORVASTATIN 40 MG TABLET (ATORVASTATIN CALCIUM) TAKE 1 TABLET BY MOUTH EVE  RY DAY  LISINOPRIL 10 MG ORAL TABLET (LISINOPRIL) Take one tablet by mouth once dai  ly; Route: ORAL  FLUOXETINE HCL 20 MG ORAL CAPSULE (FLUOXETINE  HCL) Take one capsule by mout  h once daily.; Route: ORAL      * ACCUCHECK GLUCOMETER check sugars three times daily Dx: Diabetes type   II with retinopathy E11.319      RELION ALL-IN-ONE DEVICE (BLOOD GLUC METER DISP-STRIPS) Use three times   daily (whichever brand covered/with meter)      LANCETS (LANCETS) Use as directed.      PEN NEEDLES 32G X 4 MM (INSULIN PEN NEEDLE) Use as  directed to administ  er medication from pen 4x/day      ACCU-CHEK AVIVA IN VITRO STRIP (GLUCOSE BLOOD) use 1-3 times a day   dx   E11 .319; Route: IN VITRO      FREESTYLE LIBRE 14 DAY SENSOR (CONTINUOUS BLOOD GLUC SENSOR) USE AS DIR  ECTED TO MONITOR BLOOD SUGAR . CHANGE EVERY 14 DAYS, E 11.9      FREESTYLE LIBRE 14 DAY READER DEVICE (CONTINUOUS BLOOD GLUC RECEIVER) U  se device as directed to test blood sugars. E11.9  .  Medications Reviewed:  Done  .  [name]  Allergies (prior to today's visit):  PENICILLIN V POTASSIUM (PENICILLIN V POTASSIUM) (Severe)  .  Allergies Reviewed:  Done  .  .  .  .  .  Vitals:   Ht: 64.5 in.  Wt: 226.2 lbs.  BMI (in-lb) 38.37  Temp: 98.2deg F.     BP (Initial [place] Screening): 138 / 78     BP (Rechecked, Actionable): 138 / 7  8 mmHg   Pulse Rate: 87 bpm O2 Sat: 100 %  .  [name]  Additional PE:   Gen: Alert, NAD, well hydrated, well developed  Head: NCAT  Ears: no external deformities, canals clear, TMs pearly gray, gross hearing   intact  Eyes: EOMI, PERRL, no scleral icterus, no conjunctival injection, vision   grossly normal  Nose: no bleeding  Mouth: Good dentition, no lesions, tongue midline, no exudate/erythema; mal  ampati 4 o/p   Neck: Supple, no ant/post cervical or supraclavicular lymphadenopathy,   no carotid bruits, thyroid not palpable  CV: RRR, no M/R/G, no edema, DP 2+ b/l  .  Pulm: CTA b/l, resp effort wnl  Abd: soft, nontender, nondistended, no abdominal bruits, no   hepatosplenomegaly,  Musc: station and gait wnl, moves all extremities without obvious   limitations  Skin: No rashes, no obvious bruising, no suspicious lesions  Neuro: CN 2-12 intact, DTR symmetric throughout, hand grip 5/5 b/l, no   sensory deficits  Psych: Mood and affect appropriate  .  Declines breast and pelvic exam today   .  Assessment /T/ Plan:   58 year ol dhrer for annual with uncontrolled DM and fatiue   .  .  .  poor sleep and fatigue, Malampati 3/4, obesity with poor concentration, irr  itability, easy to  fall asleep and AM headache, snoring; refer for sleep st  udy; she prefers labs next time (Thyroid Test  TSH = 0.88     (02/13/2017)     (Normal = 0.35-5.5)  HCT = 37.0     (02/12/2020)     (Normal = 32-45)  .  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 Monitoring with   eye care   .  .   depression/reactivity/irritability  - restart fluoxetine, declines counsel  ing   .  .  .  .  .  .  MJ use -advised against daily   .  .  .  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  .  .  Orders (this visit):  Mammo Screening [CPT-77057]  RTC in 4 weeks [RTC-028]  RTC in 3 months [RTC-090]  Est Preventive 40-64yo [CPT-99396]  Est Level 3 (MDM or 20-29 min) [ZOX-09604]  Medications:  FLUOXETINE HCL 20 MG ORAL CAPSULE (FLUOXETINE HCL) Take one capsule by mout  h once daily.  #30[Capsule] x 3      Route:ORAL      Entered and Authorized by:  [name] MD      Signed by:  [name] MD on 08/03/2020      Method used:    Electronically to               [facility]  [address]  [address]  [place], [place]  [address]  Ph: [phone]  Fax: [phone]      Note  to Pharmacy: Route: ORAL;SAVINGS FOR NON-COVERED MEDICATIONS-For c  laims: [id number]:[id number] UXL:[id number] Group:[id number] VO:[id number];                     Questions: MedImpact [phone]       RxID:   [id number]  FREESTYLE LIBRE 14 DAY SENSOR (CONTINUOUS BLOOD GLUC SENSOR) USE AS DIRECTE  D TO MONITOR BLOOD SUGAR . CHANGE EVERY 14 DAYS, E 11.9  #8[Unspecified] x 3      Entered and Authorized by:  [name] MD      Signed by:  [name] MD on 08/03/2020      Method used:    Electronically to               [facility]  [address] [place], [place]  [address]  Ph: [phone]  Fax: [phone]      Note to Pharmacy: SAVINGS FOR NON-COVERED MEDICATIONS-For claims: BIN:[id number]  [id number] CWC:[id number] Group:[id number] DV:[id number]; Questions:                     MedImpact [phone]       RxID:   [id number]  LANTUS SOLOSTAR 100 UNIT/ML SUBCUTANEOUS SOLUTION PEN-INJECTOR (INSULIN GLA  RGINE) Inject 45 units sc daily  #15[Prefilled Pen Syrnge] x 3      Route:SUBCUTANEOUS      Entered and Authorized by:  [name] MD      Signed by:  [name] MD on 08/03/2020  Method used:    Electronically to               [facility]  [address] [place], [place]  [address]  Ph: [phone]  Fax: [phone]      Note to Pharmacy: Route: SC;SAVINGS FOR NON-COVERED MEDICATIONS-For cla  ims: VHQ:[id number] BMW:[id number] Group:[id number] NU:[id number];                     Questions: MedImpact [phone]       RxID:   [id number]  OZEMPIC (0.25 OR 0.5 MG/DOSE) 2 MG/1.5ML SUBCUTANEOUS SOLUTION PEN-INJECTO   (SEMAGLUTIDE) Inject 0.5  mg sc weekly  #3[Prefilled Pen Syrnge] x 3      Route:SUBCUTANEOUS      Entered and Authorized by:  [name] MD      Signed by:  [name] MD on 08/03/2020      Method used:    [facility]               [facility]  [address] [place], [place]  [address]  Ph: [phone]  Fax: [phone]      Note to Pharmacy: Route: SC;CRX: /F/Eligible patients may qualify for  s  avings. Direct patients                     to [email or web address] Sponsored by [employer]./F/       RxID:   [id number]  .  [name]  Patient Care Plan  .  [name]  Immunization Worksheet 2019 (rev 02/11/2020)   .  .  .  .  .  .  .  .  .  .  .  .  .  Follow-up With:   .  Patient Health Questionnaire (PHQ-9)  1.  Over the last 2 weeks how often have you been bothered by any of the fo  llowing problems?            Not at All (0) -  Several Days (1) -  More Than Half the Days (2)   -  Nearly Every Day (3)  a. Little interest or pleasure in doing things..... 2  b. Feeling down, depressed, or hopeless.....  2  c. Trouble falling/staying asleep, sleeping too much..... 1  d. Feeling tired or having little energy.Heidi KitchenMarland KitchenMarland [name]. 1  e. Poor appetite or overeating.Heidi KitchenMarland KitchenMarland [name]. 1  f. Feeling bad about yourself - or that you are a failure or have let yours  elf or your family down..... 0  g. Trouble concentrating on things such as reading the newspaper or watchin  g television...[name]. 1  h. Moving or speaking so slowly that other people could have noticed.   Or   the opposite - being so fidgety or restless that you have been moving aroun  d a lot more than usual..... 0  i. Thoughts that you would be better off dead or of hurting yourself in som  e way..... 0  Follow-Up PHQ-9: 8  .  Patient Health Questionnaire - [phone] Screening: 8 scored  .  ......................................Heidi KitchenDaleen Snook  August 03, 2020 3:  41 PM  .  .  .  Electronically Signed by [name] MD on 08/04/2020 at 10:40 AM  ________________________________________________________________________

## 2020-08-03 NOTE — Progress Notes (Signed)
 [facility] August 03, 2020  [address]  [place] [address]  Main: [phone]  Fax:   Patient Portal: [email or web address]            [facility]   Phone: [phone] . Fax: [phone]    Patient Name: [name]  DOB: [birth year] MR#: [id number]  Primary Care Physician: [name] MD  Phone #: [phone]  Primary Language: English  Insurance: [id number] HMO [facility] PCP   ID#: [id number]  Pt Address: [address]  [place], [place]  [address]  Home Phone: [phone]   Work Phone: [phone]  **********************************************************************************************************************************************  NOTE: Recent supporting office notes will be required with this request.  **********************************************************************************************************************************************  Ht: 64.5 in     Wt: 226.2 lbs     BMI: 38.37     Epworth Scale Total (required, see below): __13____  Renie Ora Sleepiness Scale (score each of following 8 questions from 0 to 3, record total above):    0 = Would never doze/sleep; 1 = Slight chance of doze/sleep; 2 = Mod chance of doze/sleep; 3 = High chance of doze/sleep  __3_ Sitting and reading        _3__ Watching TV _0__ Sitting inactive in public place __3_ Lying down in afternoon       _1__ Sitting quiet after lunch   _0__ Sitting and talking to someone        __1_ Stopped for few minutes in traffic while driving ___2 As passenger in motor vehicle for > 1 hr    Special Needs? _______________________________________________________  Primary Suspected Diagnosis: _____OSA_______________________________________  Has pt had previous sleep testing? ___ Yes _x__ No (If yes, please submit Sleep Reports if available)  Schedule follow-up with Sleep Specialist? _x__ Yes ___  No  **********************************************************************************************************************************************  Clinical Info (Select at least 3 for Adult only, to qualify for Home Study Testing):   _x__ Excessive daytime sleepiness _x__ Disruptive snoring  __x_ Disturbed or restless sleep ___ Non-restorative sleep  __x_ Hypertension  ___ Witnessed Apnea  __x_ Irritability   ___ Hallucinations  _x__ Decrease concentration ___ Memory loss   ___ Wake up gasping/choking  __x_ Morning headaches  ___ Night Terrors  ___ Sleep walking/talking  ___ Bruxism   ___ Leg/Arm jerks  ___ Decrease libido  ___ Injury during sleep  ___ Nocturia   ___ Inability to fall/remain asleep  ___ Irreg. breathing/pauses during sleep ___ Enlarged Tonsils/abnormalities compromising respiration  ___ Other: ________________________    Medical Hx (Check all that apply): ___ Stroke (date__________) ___ Transient Ischemic Attack  ___ Coronary Artery Disease ___ Pulmonary Hypertension ___ Mod/Sev COPD, Lung disease  ___ Mod/Sev CHF  ___ Suspected Nocturnal Seizures ___ Suspected Narcolepsy  ___ Polycythemia  ___ Documented Obesity Hypoventilation Syndrome  ___ Arrhythmias: ___________ ___ H/o Central OR Mixed Sleep Apnea (must be in prior sleep report)  ___ Neurodegenerative Disorders / Cognitive Impairment preventing HST  ___ Sleep behaviors not recalled by pt, but suspicious of REM behavior disorder  ___ Neuromuscular Weakness affecting respiratory function or impairing activities (specify): ___________     Other (Please check all that apply): ___ Oxygen dependent for any reason ___ Recent T/A, UPP ___ Change in BMI > 5  ___ Neuromuscular Impairment; needs assistance with ADLs  ___ New Symptoms/Other: ______________    Physician signature: __________Julie [name] MD____________________________________ Date: 08/03/2020    Coordinator Checklist: Initial to notate that each area is complete  ___ [place]worth  Sleepiness  ___ Clinical Info (must  select 3 for home study)  ___ Medical History (Office Notes)    Problems:  DEPRESSION, MILD, RECURRENT (ICD-296.31) (ICD10-F33.0)  DIABETES MELLITUS, TYPE II, 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) (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)  HYPERTENSION (ICD-401.9) (ICD10-I10)  ANEMIA (ICD-285.9) (ICD10-D64.9)  HIDRADENITIS (ICD-705.83) (ICD10-L73.2)  SHORTNESS OF BREATH (ICD-786.05) (ICD10-R06.02)  ANXIETY (ICD-300.00) (ICD10-F41.9)  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)  ABNORMAL PAP SMEAR 12/11 LSIL; COLPO 12/11; BX CIN I; 6/12 ASCUS PAP; 4/13 PAP NEG; HR HPV POS, NEG COLPO; 5/14 PAP/HR HPV NEG (ICD-795.00) (ICD10-R87.619)  ANNUAL EXAM (ICD-V72.31) (ICD10-Z00.00)  PATELLAR DISLOCATION-MEDIAL RETINACULAR TEAR; RIGHT KNEE (ICD-836.3) (ICD10-S83.006)  OBESITY (ICD-278.00) (ICD10-E66.9)  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)  SEXUALLY TRANSMITTED DISEASE, EXPOSURE TO-H/O GC, TRICH, HPV, CHLAMYDIA (ICD-V01.6) (ICD10-Z20.2)  S/P CHOLECYSTECTOMY (ICD-V45.79) (ICD10-Z90.49)      Medications:   OZEMPIC (0.25 OR 0.5 MG/DOSE) 2 MG/1.5ML SUBCUTANEOUS SOLUTION PEN-INJECTO Inject 0.5  mg sc weekly; Route: SUBCUTANEOUS, LANTUS SOLOSTAR 100 UNIT/ML SUBCUTANEOUS SOLUTION PEN-INJECTOR Inject 45 units sc daily; Route: SUBCUTANEOUS, HUMALOG 100 UNITS/ML KWIKPEN USE 6 UNITS BEFORE EACH MEAL, HYDROCHLOROTHIAZIDE 25 MG TAB TAKE 1 TABLET BY MOUTH EVERY DAY, ATORVASTATIN 40 MG TABLET TAKE 1 TABLET BY MOUTH EVERY DAY, LISINOPRIL 10 MG ORAL TABLET Take one tablet by mouth once daily; Route: ORAL, FLUOXETINE HCL 20 MG ORAL CAPSULE Take one capsule by mouth once daily.; Route: ORAL, ACCUCHECK GLUCOMETER check sugars three times daily Dx: Diabetes type II with retinopathy E11.319, RELION ALL-IN-ONE DEVICE Use three times daily (whichever brand covered/with meter), LANCETS Use as directed., PEN NEEDLES 32G X 4 MM Use as directed to administer medication from pen 4x/day, ACCU-CHEK AVIVA IN VITRO STRIP use 1-3 times a day   dx E11  .319; Route: IN VITRO, FREESTYLE LIBRE 14 DAY SENSOR USE AS DIRECTED TO MONITOR BLOOD SUGAR . CHANGE EVERY 14 DAYS, E 11.9, FREESTYLE LIBRE 14 DAY READER DEVICE Use device as directed to test blood sugars. E11.9.      Allergies:   PENICILLIN V POTASSIUM (PENICILLIN V POTASSIUM) (Severe)          Created By [name] MD on 08/03/2020 at 04:58 PM    Electronically Signed By [name] MD on 08/03/2020 at 04:58 PM

## 2020-08-04 ENCOUNTER — Ambulatory Visit

## 2020-08-04 NOTE — Telephone Encounter (Signed)
 Call Details:   I forgot to order sleep study yesterday but I did the letter, ordering now. thank you! .......................................[name] MD  August 04, 2020 10:41 AM        RESPONSE/ORDERS:    Will fax over. 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)  HYPERTENSION (ICD-401.9) (ICD10-I10)  ANEMIA (ICD-285.9) (ICD10-D64.9)  HIDRADENITIS (ICD-705.83) (ICD10-L73.2)  SHORTNESS OF BREATH (ICD-786.05) (ICD10-R06.02)  ANXIETY (ICD-300.00) (ICD10-F41.9)  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)  ABNORMAL PAP SMEAR 12/11 LSIL; COLPO 12/11; BX CIN I; 6/12 ASCUS PAP; 4/13 PAP NEG; HR HPV POS, NEG COLPO; 5/14 PAP/HR HPV NEG (ICD-795.00) (ICD10-R87.619)  ANNUAL EXAM (ICD-V72.31) (ICD10-Z00.00)  PATELLAR DISLOCATION-MEDIAL RETINACULAR TEAR; RIGHT KNEE (ICD-836.3) (ICD10-S83.006)  OBESITY (ICD-278.00) (ICD10-E66.9)  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)  SEXUALLY TRANSMITTED DISEASE, EXPOSURE TO-H/O GC, TRICH, HPV, CHLAMYDIA (ICD-V01.6) (ICD10-Z20.2)  S/P CHOLECYSTECTOMY (ICD-V45.79) (ICD10-Z90.49)    Allergies:   PENICILLIN V POTASSIUM (PENICILLIN V POTASSIUM) (Severe)    Meds (prior to this call):   OZEMPIC (0.25 OR 0.5 MG/DOSE) 2 MG/1.5ML SUBCUTANEOUS SOLUTION PEN-INJECTO (SEMAGLUTIDE) Inject 0.5  mg sc weekly; Route: SUBCUTANEOUS  LANTUS SOLOSTAR 100 UNIT/ML SUBCUTANEOUS SOLUTION PEN-INJECTOR (INSULIN GLARGINE) Inject 45 units sc daily; Route: SUBCUTANEOUS  HUMALOG 100 UNITS/ML KWIKPEN (INSULIN LISPRO) USE 6 UNITS BEFORE EACH MEAL  HYDROCHLOROTHIAZIDE 25 MG TAB (HYDROCHLOROTHIAZIDE) TAKE 1 TABLET BY MOUTH EVERY DAY  ATORVASTATIN 40 MG TABLET (ATORVASTATIN CALCIUM) TAKE 1 TABLET BY  MOUTH EVERY DAY  LISINOPRIL 10 MG ORAL TABLET (LISINOPRIL) Take one tablet by mouth once daily; Route: ORAL  FLUOXETINE HCL 20 MG ORAL CAPSULE (FLUOXETINE HCL) Take one capsule by mouth once daily.; Route: ORAL      * ACCUCHECK GLUCOMETER check sugars three times daily Dx: Diabetes type II with retinopathy E11.319      RELION ALL-IN-ONE DEVICE (BLOOD GLUC METER DISP-STRIPS) Use three times daily (whichever brand covered/with meter)      LANCETS (LANCETS) Use as directed.      PEN NEEDLES 32G X 4 MM (INSULIN PEN NEEDLE) Use as directed to administer medication from pen 4x/day      ACCU-CHEK AVIVA IN VITRO STRIP (GLUCOSE BLOOD) use 1-3 times a day   dx E11 .319; Route: IN VITRO      FREESTYLE LIBRE 14 DAY SENSOR (CONTINUOUS BLOOD GLUC SENSOR) USE AS DIRECTED TO MONITOR BLOOD SUGAR . CHANGE EVERY 14 DAYS, E 11.9      FREESTYLE LIBRE 14 DAY READER DEVICE (CONTINUOUS BLOOD GLUC RECEIVER) Use device as directed to test blood sugars. E11.9            Created By [name] MD on 08/04/2020 at 10:40 AM    Electronically Signed By [name] MD on 08/04/2020 at 11:57 AM

## 2020-08-06 ENCOUNTER — Ambulatory Visit

## 2020-08-06 NOTE — Telephone Encounter (Signed)
 Portal Medical Question, (GMA) [facility] [place], [name], MD  Send a Message to My Provider/Team  Provider  [facility]) [facility] [place] - [name], MD   Do not use this form for Emergencies. If you are having an emergency, call 911 now.     Subject:  Other   Details:  Referral request for [facility] - GYN  Provider Duwayne Heck [facility]  [facility] - [id number]  FAX [phone]  PHONE [phone]    Thank you!   Disclaimer     If this is a medical emergency please call 911. Messages submitted will be processed by our staff within 48 business hours.   If you need immediate assistance please call the practice.      Note: These are specially coded messages to be directed to your Care Team, and will not be visible in the Sent folder of your Inbox.           Created By  LinkLogic on 08/06/2020 at 11:57 AM    Electronically Signed By Verlon Au on 08/09/2020 at 10:46 AM

## 2020-08-09 ENCOUNTER — Ambulatory Visit

## 2020-08-09 NOTE — Progress Notes (Signed)
 [facility]  [address]  [place] [address]  Main: [phone]  Fax: [phone]  Patient Portal: [email or web address]        August 09, 2020            MRN: [id number]            DOB: [birth year]   [name]   [address]   [place] [address]      I am writing to let you know that the following appointments have been made for you.       Primary Care  [address]  Your appointment is scheduled for 11/25/2020 at 9 am  You are scheduled to see Dr. [name]  If you need to make any changes, please call [phone]    Mammogram  [address]  Your appointment is scheduled for 11/25/2020 at 11:45 am  If you need to make any changes, please call [phone]            Sincerely,         Cyndia Skeeters  [facility]  [phone]        Created By Cyndia Skeeters on 08/09/2020 at 10:00 AM    Electronically Signed By [name] MD on 08/09/2020 at 10:11 AM

## 2020-09-06 ENCOUNTER — Ambulatory Visit

## 2020-09-20 ENCOUNTER — Ambulatory Visit

## 2020-09-20 MED ORDER — LISINOPRIL: 1 | 90 | 0 refills | 0 days | Status: AC

## 2020-09-21 ENCOUNTER — Ambulatory Visit

## 2020-09-21 NOTE — Telephone Encounter (Signed)
 RESPONSE/ORDERS:    Patient no showed doximity DM appt - Called patient, phone went straight to VM.  LVM. Call back number provided. Heidi, please call to reschedule. Thanks!......................................Marland KitchenNeta Ehlers PharmD  September 21, 2020 9:10 AM    Will forwd to [place] to schedule with Ardis Hughs on 6B  .......................................[name]  September 21, 2020 9:23 AM    Sorry should have clarified - Cherith will be staying with me as an exception per pt/pcp request. Thanks! ......................................Marland KitchenNeta Ehlers PharmD  September 21, 2020 9:26 AM    LMV to call back..  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) (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)  HYPERTENSION (ICD-401.9) (ICD10-I10)  ANEMIA (ICD-285.9) (ICD10-D64.9)  HIDRADENITIS (ICD-705.83) (ICD10-L73.2)  SHORTNESS OF BREATH (ICD-786.05) (ICD10-R06.02)  ANXIETY (ICD-300.00) (ICD10-F41.9)  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)  ABNORMAL PAP SMEAR 12/11 LSIL; COLPO 12/11; BX CIN I; 6/12 ASCUS PAP; 4/13 PAP NEG; HR HPV POS, NEG COLPO; 5/14 PAP/HR HPV NEG (ICD-795.00) (ICD10-R87.619)  ANNUAL EXAM (ICD-V72.31) (ICD10-Z00.00)  PATELLAR DISLOCATION-MEDIAL RETINACULAR TEAR; RIGHT KNEE (ICD-836.3) (ICD10-S83.006)  OBESITY (ICD-278.00) (ICD10-E66.9)  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)  SEXUALLY TRANSMITTED DISEASE, EXPOSURE TO-H/O GC, TRICH, HPV, CHLAMYDIA (ICD-V01.6) (ICD10-Z20.2)  S/P CHOLECYSTECTOMY (ICD-V45.79) (ICD10-Z90.49)    Allergies:   PENICILLIN V POTASSIUM (PENICILLIN V POTASSIUM) (Severe)    Meds (prior to this call):   OZEMPIC (0.25 OR 0.5 MG/DOSE) 2 MG/1.5ML SUBCUTANEOUS SOLUTION PEN-INJECTO (SEMAGLUTIDE) Inject 0.5  mg sc weekly; Route: SUBCUTANEOUS  LANTUS SOLOSTAR 100  UNIT/ML SUBCUTANEOUS SOLUTION PEN-INJECTOR (INSULIN GLARGINE) Inject 45 units sc daily; Route: SUBCUTANEOUS  HUMALOG 100 UNITS/ML KWIKPEN (INSULIN LISPRO) USE 6 UNITS BEFORE EACH MEAL  HYDROCHLOROTHIAZIDE 25 MG TAB (HYDROCHLOROTHIAZIDE) TAKE 1 TABLET BY MOUTH EVERY DAY  ATORVASTATIN 40 MG TABLET (ATORVASTATIN CALCIUM) TAKE 1 TABLET BY MOUTH EVERY DAY  LISINOPRIL 10 MG TABLET (LISINOPRIL) TAKE 1 TABLET BY MOUTH EVERY DAY  FLUOXETINE HCL 20 MG ORAL CAPSULE (FLUOXETINE HCL) Take one capsule by mouth once daily.; Route: ORAL      * ACCUCHECK GLUCOMETER check sugars three times daily Dx: Diabetes type II with retinopathy E11.319      RELION ALL-IN-ONE DEVICE (BLOOD GLUC METER DISP-STRIPS) Use three times daily (whichever brand covered/with meter)      LANCETS (LANCETS) Use as directed.      PEN NEEDLES 32G X 4 MM (INSULIN PEN NEEDLE) Use as directed to administer medication from pen 4x/day      ACCU-CHEK AVIVA IN VITRO STRIP (GLUCOSE BLOOD) use 1-3 times a day   dx E11 .319; Route: IN VITRO      FREESTYLE LIBRE 14 DAY SENSOR (CONTINUOUS BLOOD GLUC SENSOR) USE AS DIRECTED TO MONITOR BLOOD SUGAR . CHANGE EVERY 14 DAYS, E 11.9      FREESTYLE LIBRE 14 DAY READER DEVICE (CONTINUOUS BLOOD GLUC RECEIVER) Use device as directed to test blood sugars. E11.9            Created By Neta Ehlers PharmD on 09/21/2020 at 09:10 AM    Electronically Signed By Neta Ehlers PharmD on 09/21/2020 at 04:27 PM

## 2020-11-01 ENCOUNTER — Ambulatory Visit

## 2020-11-01 ENCOUNTER — Ambulatory Visit: Admitting: Hospitalist

## 2020-11-09 ENCOUNTER — Ambulatory Visit

## 2020-11-09 NOTE — Progress Notes (Signed)
 Lantus is Not Covered by the insurance. Insurance will cover Basaglar Insulin Pen as an alternative with a copay of $30. Please review and advise   .......................................Kandice Hams  November 09, 2020 1:16 PM  Loraine Leriche, is this a 1:1 switch? .......................................[name] MD  November 09, 2020 4:10 PM    Yes, Lantus and [name] can be substituted on an equivalent unit-per-unit basis  ......................................Marland KitchenHulda Humphrey PharmD  November 09, 2020 4:20 PM  sending [name], can you let her know? .......................................[name] MD  November 09, 2020 5:34 PM  called pt left a message for pt .  medication switch as above.explain the directions as above , to call if any questions. 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) (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)  HYPERTENSION (ICD-401.9) (ICD10-I10)  ANEMIA (ICD-285.9) (ICD10-D64.9)  HIDRADENITIS (ICD-705.83) (ICD10-L73.2)  SHORTNESS OF BREATH (ICD-786.05) (ICD10-R06.02)  ANXIETY (ICD-300.00) (ICD10-F41.9)  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)  ABNORMAL PAP SMEAR 12/11 LSIL; COLPO 12/11; BX CIN I; 6/12 ASCUS PAP; 4/13 PAP NEG; HR HPV POS, NEG COLPO; 5/14 PAP/HR HPV NEG (ICD-795.00) (ICD10-R87.619)  ANNUAL EXAM (ICD-V72.31) (ICD10-Z00.00)  PATELLAR DISLOCATION-MEDIAL RETINACULAR TEAR; RIGHT KNEE (ICD-836.3) (ICD10-S83.006)  OBESITY (ICD-278.00) (ICD10-E66.9)  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)  SEXUALLY TRANSMITTED DISEASE, EXPOSURE TO-H/O GC, TRICH, HPV, CHLAMYDIA (ICD-V01.6) (ICD10-Z20.2)  S/P CHOLECYSTECTOMY (ICD-V45.79) (ICD10-Z90.49)    Allergies   PENICILLIN V POTASSIUM (PENICILLIN V POTASSIUM) (Severe)      Medications   OZEMPIC (0.25 OR  0.5 MG/DOSE) 2 MG/1.5ML SUBCUTANEOUS SOLUTION PEN-INJECTO (SEMAGLUTIDE) Inject 0.5  mg sc weekly; Route: SUBCUTANEOUS  HUMALOG 100 UNITS/ML KWIKPEN (INSULIN LISPRO) USE 6 UNITS BEFORE EACH MEAL  HYDROCHLOROTHIAZIDE 25 MG TAB (HYDROCHLOROTHIAZIDE) TAKE 1 TABLET BY MOUTH EVERY DAY  ATORVASTATIN 40 MG TABLET (ATORVASTATIN CALCIUM) TAKE 1 TABLET BY MOUTH EVERY DAY  LISINOPRIL 10 MG TABLET (LISINOPRIL) TAKE 1 TABLET BY MOUTH EVERY DAY  FLUOXETINE HCL 20 MG ORAL CAPSULE (FLUOXETINE HCL) Take one capsule by mouth once daily.; Route: ORAL      * ACCUCHECK GLUCOMETER check sugars three times daily Dx: Diabetes type II with retinopathy E11.319      RELION ALL-IN-ONE DEVICE (BLOOD GLUC METER DISP-STRIPS) Use three times daily (whichever brand covered/with meter)      LANCETS (LANCETS) Use as directed.      PEN NEEDLES 32G X 4 MM (INSULIN PEN NEEDLE) Use as directed to administer medication from pen 4x/day      ACCU-CHEK AVIVA IN VITRO STRIP (GLUCOSE BLOOD) use 1-3 times a day   dx E11 .319; Route: IN VITRO      FREESTYLE LIBRE 14 DAY SENSOR (CONTINUOUS BLOOD GLUC SENSOR) USE AS DIRECTED TO MONITOR BLOOD SUGAR . CHANGE EVERY 14 DAYS, E 11.9      FREESTYLE LIBRE 14 DAY READER DEVICE (CONTINUOUS BLOOD GLUC RECEIVER) Use device as directed to test blood sugars. E11.9  Basaglar KwikPen U-100 Insulin 100 unit/mL (3 mL) insulin pen (insulin glargine) 45 unit subcutaneously once a day           Services Due: DIAB EYE EX, HGBA1C or HGBA1C%POC, PAP SMEAR.      Items recorded during this note:          Immunization Worksheet 2019 (rev 02/11/2020)       Chronic  Controlled Substance         HCP materials? Printed                Risk Assessment             MAT Risk Assessment                 COMM Screening   Scale: (0)Never (1)Seldom (2)Sometimes (3)Often (4)Very Often            ORT Screening   Patient sex = Female          Treatment Plan             Created By Jeanann Lewandowsky RN on 11/09/2020 at 12:35 PM    Electronically Signed By [name] MD on 11/10/2020 at 10:31 AM

## 2020-11-10 ENCOUNTER — Ambulatory Visit

## 2020-11-25 ENCOUNTER — Ambulatory Visit

## 2020-11-25 ENCOUNTER — Ambulatory Visit: Admitting: Internal Medicine

## 2020-11-25 ENCOUNTER — Ambulatory Visit: Admit: 2020-11-25 | Payer: HMO

## 2020-11-25 NOTE — Telephone Encounter (Signed)
 Call Details:   please help her reschedule/I will double book her .......................................Heidi Sole MD  November 25, 2020 9:31 AM        RESPONSE/ORDERS:    Jeanene Erb pt. LMOM for pt to call back to schedule appt. 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) (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)  HYPERTENSION (ICD-401.9) (ICD10-I10)  ANEMIA (ICD-285.9) (ICD10-D64.9)  HIDRADENITIS (ICD-705.83) (ICD10-L73.2)  SHORTNESS OF BREATH (ICD-786.05) (ICD10-R06.02)  ANXIETY (ICD-300.00) (ICD10-F41.9)  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)  ABNORMAL PAP SMEAR 12/11 LSIL; COLPO 12/11; BX CIN I; 6/12 ASCUS PAP; 4/13 PAP NEG; HR HPV POS, NEG COLPO; 5/14 PAP/HR HPV NEG (ICD-795.00) (ICD10-R87.619)  ANNUAL EXAM (ICD-V72.31) (ICD10-Z00.00)  PATELLAR DISLOCATION-MEDIAL RETINACULAR TEAR; RIGHT KNEE (ICD-836.3) (ICD10-S83.006)  OBESITY (ICD-278.00) (ICD10-E66.9)  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)  SEXUALLY TRANSMITTED DISEASE, EXPOSURE TO-H/O GC, TRICH, HPV, CHLAMYDIA (ICD-V01.6) (ICD10-Z20.2)  S/P CHOLECYSTECTOMY (ICD-V45.79) (ICD10-Z90.49)    Allergies:   PENICILLIN V POTASSIUM (PENICILLIN V POTASSIUM) (Severe)    Meds (prior to this call):   OZEMPIC (0.25 OR 0.5 MG/DOSE) 2 MG/1.5ML SUBCUTANEOUS SOLUTION PEN-INJECTO (SEMAGLUTIDE) Inject 0.5  mg sc weekly; Route: SUBCUTANEOUS  HUMALOG 100 UNITS/ML KWIKPEN (INSULIN LISPRO) USE 6 UNITS BEFORE EACH MEAL  HYDROCHLOROTHIAZIDE 25 MG TAB (HYDROCHLOROTHIAZIDE) TAKE 1 TABLET BY MOUTH EVERY DAY  ATORVASTATIN 40 MG TABLET (ATORVASTATIN CALCIUM) TAKE 1 TABLET BY MOUTH EVERY DAY  LISINOPRIL 10 MG TABLET (LISINOPRIL) TAKE 1 TABLET BY MOUTH EVERY DAY  FLUOXETINE HCL 20 MG ORAL CAPSULE (FLUOXETINE HCL) Take one capsule by mouth once daily.;  Route: ORAL      * ACCUCHECK GLUCOMETER check sugars three times daily Dx: Diabetes type II with retinopathy E11.319      RELION ALL-IN-ONE DEVICE (BLOOD GLUC METER DISP-STRIPS) Use three times daily (whichever brand covered/with meter)      LANCETS (LANCETS) Use as directed.      PEN NEEDLES 32G X 4 MM (INSULIN PEN NEEDLE) Use as directed to administer medication from pen 4x/day      ACCU-CHEK AVIVA IN VITRO STRIP (GLUCOSE BLOOD) use 1-3 times a day   dx E11 .319; Route: IN VITRO      FREESTYLE LIBRE 14 DAY SENSOR (CONTINUOUS BLOOD GLUC SENSOR) USE AS DIRECTED TO MONITOR BLOOD SUGAR . CHANGE EVERY 14 DAYS, E 11.9      FREESTYLE LIBRE 14 DAY READER DEVICE (CONTINUOUS BLOOD GLUC RECEIVER) Use device as directed to test blood sugars. E11.9  Basaglar KwikPen U-100 Insulin 100 unit/mL (3 mL) insulin pen (insulin glargine) 45 unit subcutaneously once a day             Created By Heidi Sole MD on 11/25/2020 at 09:31 AM    Electronically Signed By Heidi Sole MD on 11/26/2020 at 03:51 PM

## 2020-11-25 NOTE — Telephone Encounter (Signed)
 Portal Medical Question, (GMA) [facility] [place], [name], MD  Send a Message to My Provider/Team  Provider  [facility]) [facility] [place] - [name], MD   Do not use this form for Emergencies. If you are having an emergency, call 911 now.     Subject:  Non urgent medical   Details:  Cancelling appointment for today and will reschedule. Emergency with daughter, she had a fall.   Disclaimer     If this is a medical emergency please call 911. Messages submitted will be processed by our staff within 48 business hours.   If you need immediate assistance please call the practice.      Note: These are specially coded messages to be directed to your Care Team, and will not be visible in the Sent folder of your Inbox.           Created By  LinkLogic on 11/25/2020 at 08:18 AM    Electronically Signed By Verlon Au on 11/25/2020 at 09:15 AM

## 2020-12-15 ENCOUNTER — Ambulatory Visit

## 2020-12-16 ENCOUNTER — Ambulatory Visit

## 2020-12-16 NOTE — Telephone Encounter (Signed)
 RESPONSE/ORDERS:    [name] to check in and schedule f/u - No answer, LVM. Direct call back number provided. 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) (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)  HYPERTENSION (ICD-401.9) (ICD10-I10)  ANEMIA (ICD-285.9) (ICD10-D64.9)  HIDRADENITIS (ICD-705.83) (ICD10-L73.2)  SHORTNESS OF BREATH (ICD-786.05) (ICD10-R06.02)  ANXIETY (ICD-300.00) (ICD10-F41.9)  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)  ABNORMAL PAP SMEAR 12/11 LSIL; COLPO 12/11; BX CIN I; 6/12 ASCUS PAP; 4/13 PAP NEG; HR HPV POS, NEG COLPO; 5/14 PAP/HR HPV NEG (ICD-795.00) (ICD10-R87.619)  ANNUAL EXAM (ICD-V72.31) (ICD10-Z00.00)  PATELLAR DISLOCATION-MEDIAL RETINACULAR TEAR; RIGHT KNEE (ICD-836.3) (ICD10-S83.006)  OBESITY (ICD-278.00) (ICD10-E66.9)  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)  SEXUALLY TRANSMITTED DISEASE, EXPOSURE TO-H/O GC, TRICH, HPV, CHLAMYDIA (ICD-V01.6) (ICD10-Z20.2)  S/P CHOLECYSTECTOMY (ICD-V45.79) (ICD10-Z90.49)    Allergies:   PENICILLIN V POTASSIUM (PENICILLIN V POTASSIUM) (Severe)    Meds (prior to this call):   OZEMPIC (0.25 OR 0.5 MG/DOSE) 2 MG/1.5ML SUBCUTANEOUS SOLUTION PEN-INJECTO (SEMAGLUTIDE) Inject 0.5  mg sc weekly; Route: SUBCUTANEOUS  HUMALOG 100 UNITS/ML KWIKPEN (INSULIN LISPRO) USE 6 UNITS BEFORE EACH MEAL  ATORVASTATIN 40 MG TABLET (ATORVASTATIN CALCIUM) TAKE 1 TABLET BY MOUTH EVERY DAY  LISINOPRIL 10 MG TABLET (LISINOPRIL) TAKE 1 TABLET BY MOUTH EVERY DAY  FLUOXETINE HCL 20 MG ORAL CAPSULE (FLUOXETINE HCL) Take one capsule by mouth once daily.; Route: ORAL      * ACCUCHECK GLUCOMETER check sugars three times daily Dx: Diabetes type II with retinopathy E11.319      RELION ALL-IN-ONE DEVICE (BLOOD GLUC METER DISP-STRIPS) Use three times daily  (whichever brand covered/with meter)      LANCETS (LANCETS) Use as directed.      PEN NEEDLES 32G X 4 MM (INSULIN PEN NEEDLE) Use as directed to administer medication from pen 4x/day      ACCU-CHEK AVIVA IN VITRO STRIP (GLUCOSE BLOOD) use 1-3 times a day   dx E11 .319; Route: IN VITRO      FREESTYLE LIBRE 14 DAY SENSOR (CONTINUOUS BLOOD GLUC SENSOR) USE AS DIRECTED TO MONITOR BLOOD SUGAR . CHANGE EVERY 14 DAYS, E 11.9      FREESTYLE LIBRE 14 DAY READER DEVICE (CONTINUOUS BLOOD GLUC RECEIVER) Use device as directed to test blood sugars. E11.9  Basaglar KwikPen U-100 Insulin 100 unit/mL (3 mL) insulin pen (insulin glargine) 45 unit subcutaneously once a day   hydrochlorothiazide 25 mg tablet (hydrochlorothiazide) TAKE 1 TABLET BY MOUTH EVERY DAY            Created By Neta Ehlers PharmD on 12/16/2020 at 04:21 PM    Electronically Signed By Neta Ehlers PharmD on 12/16/2020 at 04:22 PM

## 2020-12-17 ENCOUNTER — Ambulatory Visit

## 2020-12-17 NOTE — Telephone Encounter (Signed)
 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) (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)  HYPERTENSION (ICD-401.9) (ICD10-I10)  ANEMIA (ICD-285.9) (ICD10-D64.9)  HIDRADENITIS (ICD-705.83) (ICD10-L73.2)  SHORTNESS OF BREATH (ICD-786.05) (ICD10-R06.02)  ANXIETY (ICD-300.00) (ICD10-F41.9)  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)  ABNORMAL PAP SMEAR 12/11 LSIL; COLPO 12/11; BX CIN I; 6/12 ASCUS PAP; 4/13 PAP NEG; HR HPV POS, NEG COLPO; 5/14 PAP/HR HPV NEG (ICD-795.00) (ICD10-R87.619)  ANNUAL EXAM (ICD-V72.31) (ICD10-Z00.00)  PATELLAR DISLOCATION-MEDIAL RETINACULAR TEAR; RIGHT KNEE (ICD-836.3) (ICD10-S83.006)  OBESITY (ICD-278.00) (ICD10-E66.9)  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)  SEXUALLY TRANSMITTED DISEASE, EXPOSURE TO-H/O GC, TRICH, HPV, CHLAMYDIA (ICD-V01.6) (ICD10-Z20.2)  S/P CHOLECYSTECTOMY (ICD-V45.79) (ICD10-Z90.49)    Allergies:   PENICILLIN V POTASSIUM (PENICILLIN V POTASSIUM) (Severe)    Meds (prior to this call):   OZEMPIC (0.25 OR 0.5 MG/DOSE) 2 MG/1.5ML SUBCUTANEOUS SOLUTION PEN-INJECTO (SEMAGLUTIDE) Inject 0.5  mg sc weekly; Route: SUBCUTANEOUS  HUMALOG 100 UNITS/ML KWIKPEN (INSULIN LISPRO) USE 6 UNITS BEFORE EACH MEAL  ATORVASTATIN 40 MG TABLET (ATORVASTATIN CALCIUM) TAKE 1 TABLET BY MOUTH EVERY DAY  LISINOPRIL 10 MG TABLET (LISINOPRIL) TAKE 1 TABLET BY MOUTH EVERY DAY  FLUOXETINE HCL 20 MG ORAL CAPSULE (FLUOXETINE HCL) Take one capsule by mouth once daily.; Route: ORAL      * ACCUCHECK GLUCOMETER check sugars three times daily Dx: Diabetes type II with retinopathy E11.319      RELION ALL-IN-ONE DEVICE (BLOOD GLUC METER DISP-STRIPS) Use three times daily (whichever brand covered/with meter)      LANCETS (LANCETS) Use as directed.      PEN NEEDLES 32G X 4 MM (INSULIN PEN NEEDLE) Use as directed to administer medication from pen 4x/day       ACCU-CHEK AVIVA IN VITRO STRIP (GLUCOSE BLOOD) use 1-3 times a day   dx E11 .319; Route: IN VITRO      FREESTYLE LIBRE 14 DAY SENSOR (CONTINUOUS BLOOD GLUC SENSOR) USE AS DIRECTED TO MONITOR BLOOD SUGAR . CHANGE EVERY 14 DAYS, E 11.9      FREESTYLE LIBRE 14 DAY READER DEVICE (CONTINUOUS BLOOD GLUC RECEIVER) Use device as directed to test blood sugars. E11.9  Basaglar KwikPen U-100 Insulin 100 unit/mL (3 mL) insulin pen (insulin glargine) 45 unit subcutaneously once a day   hydrochlorothiazide 25 mg tablet (hydrochlorothiazide) TAKE 1 TABLET BY MOUTH EVERY DAY    Changes to Meds (this update):   Added new medication of Novolog Flexpen U-100 Insulin 100 unit/mL (3 mL) insulin pen (insulin aspart u-100) Inject 1 pen injector subcutaneously three times a day as directed 6 units with each  meal; Route: SUBCUTANEOUSLY - Signed  Rx of Novolog Flexpen U-100 Insulin 100 unit/mL (3 mL) insulin pen (insulin aspart u-100) Inject 1 pen injector subcutaneously three times a day as directed 6 units with each  meal;  #3 ml x 5;  Signed;  Entered by: [name] MD;  Authorized by: [name] MD;  Method used: Electronically to [facility] [phone] [address], [place], [place] [address], Ph: [phone] Fax: [phone],           Created By [name] MD on 12/17/2020 at 11:27 AM    Electronically Signed By [name] MD on 12/17/2020 at 11:29 AM

## 2020-12-20 ENCOUNTER — Ambulatory Visit

## 2021-02-01 ENCOUNTER — Ambulatory Visit

## 2021-02-02 NOTE — Progress Notes (Signed)
**Progress Notes**    ---    **Patient:** [name], EDGINAccount Number:** [id number] **External MRN:** [id number]   **Provider:** Elnora Morrison, MD     **DOB:** 1975/12/30 **Age:** 71 Y **Sex:** Female   **Date:** 08/21/2017     **Phone:** [phone]   **CHN#:** [id number]     **Address:** [address], [place], [place]     **Pcp:** [name]        * * *         **Subjective:**        ---       **Chief Complaints:**    --- ---         --- ---      **Medical History:** Diabetes , Chronic hypertension, Seizures as a child.        --- ---      **Gyn History:** Hx Abnormal pap smear yes, with biopsy in past but no  treatment. Last pap 2015.. Menstrual History LMP 12/05/2016. STD  HPV/condyloma, HSV/Herpes - outbreaks rare. Pregnancy #1 Date- /2006, Del-  TAB, Comments- No complications.    --- ---      **OB History:** Previous Pregnancies Total Pregnancies: 2, Full Term: 0,  Premature: 0, AB. Induced: 1, AB. Spontaneous: 0, Ectopics: 0, Multiple  Births: 0, Living: 0\.    --- ---      **Medications:** Taking Accu-Chek Aviva-Blood Glucose Test Strips 1 as  directed SQ 8 times per day, Taking Accu-Chek Softclix Lancets 1 Miscellaneous  as directed SQ 6 times per day, Taking Aspirin Low Dose 81 MG Tablet Chewable  1 tablet Orally Once a day, Taking BD Pen Needle Short U/F 31G X 8 MM  Miscellaneous as directed SQ 4 times a day, Taking Colace 100 MG Capsule 1  capsule as needed Orally Once a day, Taking Humalog KwikPen 100 UNIT/ML  Solution Pen-injector 3 units as directed (max 15 units daily) Subcutaneous  with meals, Taking Iron (Ferrous Gluconate) 256 (28 Fe) MG Tablet 1 tablet  Orally Once a day, Taking Lantus 100 UNIT/ML Solution 35 units (max of 45)  Subcutaneous Once a day, Taking NIFEdipine ER 60 MG Tablet Extended Release 24  Hour 1 tablet on an empty stomach Orally Once a day, Taking Prenatal , Taking  Valtrex 500 mg Tablet 1 tablet Orally twice a day, Medication List reviewed  and reconciled  with the patient    --- ---       **Allergies:** Penicillin: rash.    --- ---        **Objective:**        ---       **Vitals:** Pain scale 0/10, Ht-in 66, Wt-lbs 245, BMI 39.544, BP 144/76,  132/77, BP UE 144,132, BP LE 76,77.    --- ---         **Assessment:**        ---       **Assessment:**    1\. Other viral diseases complicating pregnancy, third trimester - O98.513  (Primary)    2\. Elderly multigravida in third trimester - O09.523    3\. Chronic hypertension during pregnancy - O10.919    4\. Pre-existing type 2 diabetes mellitus during pregnancy in third trimester  - O24.113    5\. Obesity (BMI 30.0-34.9) - E66.9    6\. Pre-existing type 2 diabetes mellitus during pregnancy in second trimester  - O24.112    7\. Maternal chronic hypertension in second trimester - O10.912    8\.  Other viral diseases complicating pregnancy, unspecified trimester -  O98.519    --- ---        **Plan:**        ---         **1\. Pre-existing type 2 diabetes mellitus during pregnancy in second  trimester**    Continue Accu-Chek Aviva-Blood Glucose Test Strips, 1, as directed, SQ, 8  times per day ; Continue Accu-Chek Softclix Lancets Miscellaneous, 1, as  directed, SQ, 6 times per day ; Continue BD Pen Needle Short U/F  Miscellaneous, 31G X 8 MM, as directed, SQ, 4 times a day ; Continue Humalog  KwikPen Solution Pen-injector, 100 UNIT/ML, 3 units as directed (max 15 units  daily), Subcutaneous, with meals ; Continue Lantus Solution, 100 UNIT/ML, 35  units (max of 45), Subcutaneous, Once a day .    ---    **2\. Maternal chronic hypertension in second trimester**    Continue Aspirin Low Dose Tablet Chewable, 81 MG, 1 tablet, Orally, Once a day  .    **3\. Other viral diseases complicating pregnancy, unspecified trimester**    Continue Valtrex Tablet, 500 mg, 1 tablet, Orally, twice a day .    **4\. Others**    Continue Colace Capsule, 100 MG, 1 capsule as needed, Orally, Once a day ;  Continue Iron (Ferrous Gluconate) Tablet, 256 (28  Fe) MG, 1 tablet, Orally,  Once a day ; Continue NIFEdipine ER Tablet Extended Release 24 Hour, 60 MG, 1  tablet on an empty stomach, Orally, Once a day ; Continue Prenatal .      **Procedure Codes:** [id number] PRENATAL VISIT    --- ---       **Follow Up:** 1 Week, 2-3 Days, 1 Week, 2-3 Days    --- ---    ---    ---          **Provider:** Elnora Morrison, MD    ---     **Patient:** [name] **DOB:** 27-Nov-1975 **Date:** 08/21/2017    ---    Electronically signed by Marlowe Kays , MD on 09/11/2017 at 05:22 PM EST    Sign off status: Completed

## 2021-02-02 NOTE — Progress Notes (Signed)
* * *        **  [name]**    --- ---    69 Y old Female, DOB: 06/07/1976    [address], [place], [place] [address]    Home: [phone]    Provider: Marlowe Kays D        * * *    Telephone Encounter    ---    Answered by   Curt Jews  Date: 03/21/2017         Time: 06:53 PM    Caller   pt    --- ---            Reason   pt confirmed all appt 5.22.18            Message                      pt will attend [place] on 03/27/17 at 1:00pm, clinic mfm 2:30 pm and nutriction at 3:30 pm                    * * *                ---          * * *          Patient: [name] DOB: July 29, 1976 Provider: Marlowe Kays D  03/21/2017    ---    Note generated by eClinicalWorks EMR/PM Software ([email or web address])

## 2021-02-02 NOTE — Progress Notes (Signed)
* * *        [name]**    --- ---    45 Y old Female, DOB: [birth year], External MRN: [id number]    Account Number: [id number]    [address], [place], [place]    Home: [phone]    Insurance: HMO [employer]    PCP: [name] Referring: [name]    Appointment Facility: [facility]        * * *    03/27/2017  Progress Notes: Marlane Mingle, MS, RD, LDN **CHN#:** [id number]    --- ---    ---        Reason for Appointment    ---      1\. pregestational DM    ---      History of Present Illness    ---     _General Nutrition_ :    SUBJECTIVE: 45 Y/F with T2DM, obesity, hypertension referred from MFM (Dr.  [name] Aris) for diabetes affecting pregnancy. G1 P0; EDD 09/11/2017; [redacted] weeks  gestation today. DM medication: Lantus insulin (40 units/day in morning). "I  am a diabetic and we are trying to maintain my sugar levels. I want the baby  to be as healthy as possible." Doesn't add salt; looks at labels for sodium  now. PREVIOUS MNT/WHEN/WHERE: MNT for diabetes <10 years ago; DM diagnosed age  45. EDUCATION: college. 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  LIVING ARRANGEMENTS: lives alone. WEIGHT HISTORY: 220-225 lbs pre-pregnancy.  CURRENT MEAL PLAN: 3 meals + 3 snacks.    _Pertinent Labs_ :    02/13/17 A1c, POC 8.1 H    12/11/16 A1c, POC 8.6 H    08/15/16 A1c, POC 9.6 H.      Current Medications    ---    Taking     * Aspirin Low Dose 81 MG Tablet Chewable 1 tablet Orally Once a day    ---    * Lantus 42u daily    ---    * NIFEdipine 60mg  daily    ---    * Prenatal     ---      Past Medical History    ---       Diabetes .        ---    Chronic hypertension.        ---    Seizures as a child.        ---      Allergies    ---      Penicillin: rash    ---      Vital Signs    ---    Ht-in 66, Wt-lbs 212, BMI 34.21, Ht-cm 167.64, Wt-kg 96.16    *Wt is from earlier today in MFM.      Assessments    ---    1\. Diabetes mellitus affecting pregnancy in second  trimester - O24.912  (Primary)    ---    2\. Maternal chronic hypertension in second trimester - O10.912    ---      Pre-pregnancy Weight: 220-225 lbs (stated)    Pre-pregnancy BMI: 35.5    Pregnancy weight gain: 4 lbs loss/1 week between 15 and [redacted] weeks gestation!    REE [address] [place]): [phone] kcals/day    Kcals/day for second/third trimester pregnancy: REE x 1.3 = 2189 + 350 =  2539/2189 + 452 = 2641    Protein needs (1.1g/kg mid-pregnancy): 110 g/day  Carbohydrate recommendations: minimum 175 g/day:  45/(15-30)/45/(15-30)/45/(15-30) in 3 meals + 3 snacks    Sodium recommendations: <2000 mg/day    NUTRITION DIAGNOSIS: Food- and nutrition-related knowledge deficit; related to  lack of recent exposure to MNT for T2DD and no previous exposure to MNT for  pregestational DM; as evidenced by pt report and pregnancy.    ---      Treatment    ---       **1\. Others**    Notes: INTERVENTION:    *Provided positive reinforcement for SMBG, reading labels for sodium, 3 meals/day pattern.    *Medical Nutrition Therapy (MNT)/Nutrition Education/Nutrition Counseling details: Explained: rationale for lower sodium in a setting of HTN, lower sodium seasoning suggestions; <2000 mg sodium/day. Explained: T2DM and T2DM during pregnancy (pregestational diabetes); insulin resistance; insulin requirements during pregnancy; relationship between food (especially carbohydrate), insulin, pregnancy hormones, and blood glucose (BG); T2DM management during pregnancy [food choices + exercise + medication (Lantus insulin)] and rationale for recommendations; SMBG goals of <90 fasting and <140 post-prandial (1 hour), <120 (2 hours) timed from first bite of meal (per ADA recommendation), and rationale for lower SMBG goals in pregnancy; effects of carbohydrate-, protein-, and fat-containing foods on BG and food examples of each; importance of consistent carbohydrate intake; importance of including fiber, protein, and fat with carbohydrate  at meals (balance); appropriate meal and snack spacing; carbohydrate counting (carb choices and grams, using diabetes plate method and My Gestational Diabetes Meal Plan education materials); nutrition label reading; rationale for avoiding juice and sugary beverages for T2DM pregnancy; possible avoidance of fruit in the morning (breakfast and mid-morning snack) when insulin resistance is higher (if post-meal BGs remain high); appropriate portions and balance of carbohydrate-, protein-, and fat-containing foods at meals according to the diabetes breakfast equation and diabetes plate method, snack examples according to the healthy snack equation; meal plan with carbohydrate distribution of 45 or 30/(15-30)/45/(15-30)/45/(15-30) (3 meals + 3 snacks). Encouraged snacks to prevent hypoglycemia, especially evening snack to prevent ketones overnight; recommended no more than 10 hours between evening food and breakfast the next morning. Explained method of action of basal insulin.     *Individualized recommendations:    1\. Try Mrs. Dash seasoning blends (sodium-free) instead of seasoned salt.    2\. Look at Nutrition labels for sodium, especially on frozen entrees. 500 mg  for a whole dinner is okay. Goal for day is <2000 mg/day. That's why sausage @  1200 mg/serving is too high.    3\. Aim for 3 carb choices (45 g carbohydrate) per meal within a balanced  meal. You may need to decrease to 2 carb choics at breakfast if after meal  blood glucose is >140. Aim for 1-2 carb choices at each of 3 snacks.    4\. Let your blood sugar be your guide as to how your body handles a  particular meal.    5\. Drink water or seltzer instead of cranberry juice or ginger-ale, as 4 oz  of either would correct a low blood glucose within 10 minutes.    *Educational materials provided: Diabetes Management booklet (2016), Breakfast Equation for Diabetes, Plan Your Plate with Diabetes, Nutrition Guidelines    MONITORING/EVALUATION: SMBG, wt gain  trend    *Next visit: Healthy Snack equation    *Time: 3:45 PM - 5:15 PM.     ---      Follow Up    ---    4 Weeks    Electronically signed by Marlane Mingle , MS  RD LDN on 10/08/2017 at 11:22 AM  EST    Sign off status: Completed        * * *        [facility]    [address]    [place], [place] [address]    Tel: [phone]    Fax: [phone]              * * *          Patient: [name], [name] DOB: 04-15-1976 Progress Note: Marlane Mingle, MS,  RD, LDN 03/27/2017    ---    Note generated by eClinicalWorks EMR/PM Software ([email or web address])

## 2021-02-02 NOTE — Progress Notes (Signed)
**Progress Notes**    ---    **Patient:** [name], MULKERNAccount Number:** [id number] **External MRN:** [id number]   **Provider:** Elnora Morrison, MD     **DOB:** [birth year] **Age:** 17 Y **Sex:** Female   **Date:** 06/19/2017     **Phone:** [phone]   **CHN#:** [id number]     **Address:** [address], [place], [place]     **Pcp:** [name]        * * *         **Subjective:**        ---       **Chief Complaints:**    --- ---         --- ---      **Medical History:** Diabetes , Chronic hypertension, Seizures as a child,  Pregnancy: yes.        --- ---      **Gyn History:** Hx Abnormal pap smear yes, with biopsy in past but no  treatment. Last pap 2015.. Menstrual History LMP 12/05/2016. STD  HPV/condyloma, HSV/Herpes - outbreaks rare. Pregnancy #1 Date- /2006, Del-  TAB, Comments- No complications.    --- ---      **OB History:** Previous Pregnancies Total Pregnancies: 2, Full Term: 0,  Premature: 0, AB. Induced: 1, AB. Spontaneous: 0, Ectopics: 0, Multiple  Births: 0, Living: 0\.    --- ---      **Social History:** Tobacco history: Never smoked.    --- ---      **Medications:** Taking Accu-Chek Aviva-Blood Glucose Test Strips 1 as  directed SQ 8 times per day, Taking Accu-Chek Softclix Lancets 1 Miscellaneous  as directed SQ 6 times per day, Taking Aspirin Low Dose 81 MG Tablet Chewable  1 tablet Orally Once a day, Taking BD Pen Needle Short U/F 31G X 8 MM  Miscellaneous as directed SQ 4 times a day, Taking Colace 100 MG Capsule 1  capsule as needed Orally Once a day, Taking Humalog KwikPen 100 UNIT/ML  Solution Pen-injector 3 units as directed (max 15 units daily) Subcutaneous  with meals, Taking Lantus 100 UNIT/ML Solution 35 units (max of 45)  Subcutaneous Once a day, Taking NIFEdipine ER 60 MG Tablet Extended Release 24  Hour 1 tablet on an empty stomach Orally Once a day, Taking Prenatal ,  Medication List reviewed and reconciled with the patient    --- ---       **Allergies:**  Penicillin: rash.    --- ---        **Objective:**        ---       **Vitals:** Pain scale 0/10, Ht-in 66, Wt-lbs 230, BMI 37.123, BP 130/74,  BP UE 130, BP LE 74.    --- ---         **Assessment:**        ---       **Assessment:**    1\. Maternal chronic hypertension in second trimester - O10.912 (Primary)    2\. Diabetes mellitus affecting pregnancy in second trimester - O24.912    3\. Elderly multigravida in second trimester - O09.522    23\. Obesity (BMI 30.0-34.9) - E66.9    --- ---        **Plan:**        ---         --- ---       **Therapeutic Injections:**    Rhogam : 300 given by Reece Packer , RN on right gluteus    --- ---       **  Labs:**    --- ---    Gerald Stabs: Glucose 1 Hr_    ---    _Lab: CBC/NO DIF (CBCND)_       **Procedure Codes:** [id number] Rhogam, 65784 Rhogam, FULL-DOSE, IM, 69629  Routine Venipuncture, 52841 PRENATAL VISIT    --- ---       **Follow Up:** 2 Weeks    --- ---    ---    ---          **Provider:** Elnora Morrison, MD    ---     **Patient:** [name], [name] **DOB:** 03-28-1976 **Date:** 06/19/2017    ---    Electronically signed by Marlowe Kays , MD on 06/28/2017 at 05:11 PM EDT    Sign off status: Completed

## 2021-02-02 NOTE — Progress Notes (Signed)
**Progress Notes**    ---    **Patient:** TILDA, SAMUDIOAccount Number:** [id number] **External MRN:** [id number]   **Provider:** Domenic Schwab, MD     **DOB:** 1976-08-29 **Age:** 18 Y **Sex:** Female   **Date:** 11/07/2017     **Phone:** [phone]   **CHN#:** [id number]     **Address:** [address], [place], [place]     **Pcp:** [name]        * * *         **Subjective:**        ---       **Chief Complaints:**    --- ---         --- ---      **Medical History:** Diabetes , Chronic hypertension, Seizures as a child.        --- ---      **Gyn History:** Hx Abnormal pap smear yes, with biopsy in past but no  treatment. Last pap 2015.. Menstrual History LMP 12/05/2016. STD  HPV/condyloma, HSV/Herpes - outbreaks rare. Pregnancy #1 Date- /2006, Del-  TAB, Comments- No complications.    --- ---      **OB History:** Previous Pregnancies Total Pregnancies: 2, Full Term: 0,  Premature: 0, AB. Induced: 1, AB. Spontaneous: 0, Ectopics: 0, Multiple  Births: 0, Living: 0\.    --- ---      **Medications:** Taking Accu-Chek Aviva-Blood Glucose Test Strips 1 as  directed SQ 8 times per day, Taking Accu-Chek Softclix Lancets 1 Miscellaneous  as directed SQ 6 times per day, Taking BD Pen Needle Short U/F 31G X 8 MM  Miscellaneous as directed SQ 4 times a day, Taking Colace 100 MG Capsule 1  capsule as needed Orally Once a day, Taking Humalog KwikPen 100 UNIT/ML  Solution Pen-injector 3 units as directed (max 15 units daily) Subcutaneous  with meals, Taking Hydrochlorothiazide 25 mg Tablet 1 tablet in the morning  Orally Once a day, Taking Lantus 100 UNIT/ML Solution 18units (max of 45)  Subcutaneous Once a day, Taking Prenatal , Not-Taking/PRN Aspirin Low Dose 81  MG Tablet Chewable 1 tablet Orally Once a day, Not-Taking/PRN Iron (Ferrous  Gluconate) 256 (28 Fe) MG Tablet 1 tablet Orally Once a day, Not-Taking/PRN  Valtrex 500 mg Tablet 1 tablet Orally twice a day    --- ---       **Allergies:** Penicillin:  rash.    --- ---        **Objective:**        ---       **Vitals:** Pain scale 0/10, Ht-in 66, Wt-lbs 243, BMI 39.22, BP 145/81, HR  77, RR 18, BSA 2.26, Ht-cm 167.64, Wt-kg 110.22, Wt Change -2 lb    Pt to MFM clinic for b/p check and chem 10 order per Dr Vicenta Aly. VSS 145/81,  swelling drastically improved. pt down 2 more pounds. Denies any h/a or  epigastric pain. Pt feeling well. Will monitor chem 10 results and pt to keep  appt scheduled next monday with Dr Vicenta Aly.    --- ---         **Assessment:**        ---         **Plan:**        ---         --- ---    ---    ---          **Provider:** Domenic Schwab, MD    ---     **Patient:**  KIMBERLY, NIELAND **DOB:** 1976/11/05 **Date:** 11/07/2017    ---    Electronically signed by Debbora Dus on 11/07/2017 at 01:33 PM EST    Sign off status: Completed

## 2021-02-02 NOTE — Progress Notes (Signed)
* * *        [name]**    --- ---    74 Y old Female, DOB: 06/14/1976    [address], [place], [place] [address]    Home: [phone]    Provider: [name], MD        * * *    Telephone Encounter    ---    Answered by   Dory Peru  Date: 05/01/2017         Time: 03:53 PM    Reason   Prescription Request    --- ---            Message                      Document attached from fax inbox.                Action Taken   [name] , MD 05/01/2017 5:21:12 PM > , Prescription  was faxed/electronically transferred to pharmacy.            Refills  Continue Humalog KwikPen Solution Pen-injector, 100 UNIT/ML,  Subcutaneous, 1 box, 3 units as directed (max 15 units daily), with meals, 90  days, Refills=4    --- ---          * * *              * * *        ---        Reason for Appointment    ---      1\. Prescription Request    ---      Current Medications    ---    Taking     * Accu-Chek Aviva-Blood Glucose Test Strips 1 as directed SQ 8 times per day    ---    * Accu-Chek Softclix Lancets 1 Miscellaneous as directed SQ 6 times per day    ---    * Aspirin Low Dose 81 MG Tablet Chewable 1 tablet Orally Once a day    ---    * BD Pen Needle Short U/F 31G X 8 MM Miscellaneous as directed SQ 4 times a day    ---    * Colace 100 MG Capsule 1 capsule as needed Orally Once a day    ---    * Humalog KwikPen 100 UNIT/ML Solution Pen-injector 3 units as directed (max 15 units daily) Subcutaneous with meals    ---    * Lantus 100 UNIT/ML Solution 35 units (max of 45) Subcutaneous Once a day    ---    * NIFEdipine ER 60 MG Tablet Extended Release 24 Hour 1 tablet on an empty stomach Orally Once a day    ---    * Prenatal     ---        **Medical History:**   Diabetes .    ---    Chronic hypertension.    ---    Seizures as a child.    ---      Allergies    ---      Penicillin: rash    ---      Assessments    ---    1\. Pre-existing type 2 diabetes mellitus during pregnancy in second trimester  -  O24.112    ---      Treatment    ---       **1\. Pre-existing type 2 diabetes mellitus during  pregnancy in second  trimester**    Continue Humalog KwikPen Solution Pen-injector, 100 UNIT/ML, 3 units as  directed (max 15 units daily), Subcutaneous, with meals, 90 days, 1 box,  Refills 4    ---          * * *          [name], [name] DOB: [birth year] Provider: [name], MD  05/01/2017    ---    Note generated by eClinicalWorks EMR/PM Software ([email or web address])

## 2021-02-02 NOTE — Progress Notes (Signed)
* * *        **  [name]**    --- ---    18 Y old Female, DOB: Jan 23, 1976    [address], [place], [place] [address]    Home: [phone]    Provider: Jimmy Picket        * * *    Web Encounter    ---    Answered by   Carmin Richmond  Date: 08/28/2017         Time: 11:32 AM    Caller   [name]    --- ---            Reason   Cancel Appointment Request            Message                      Cancel Appointment:      Date: 08/28/2017  Time: 3:15 PM       Reason: 1W FU      Provider: Jimmy Picket       Facility[facility]       ------------------------------------------       Reason For Cancellation: UNABLE TO GET THIS OFF FROM WORK. GOING ON MATERNITY LEAVE WILL RESCHEDULE. THANK YOU.                    * * *                ---          * * *          Patient: [name], Heidi Dakins DOB: 19-Dec-1975 Provider: Jimmy Picket  08/28/2017    ---    Note generated by eClinicalWorks EMR/PM Software ([email or web address])

## 2021-02-02 NOTE — Progress Notes (Signed)
**Progress Notes**    ---    **Patient:** [name], FOLDENAccount Number:** [id number] **External MRN:** [id number]   **Provider:** Elnora Morrison, MD     **DOB:** 12-02-1975 **Age:** 43 Y **Sex:** Female   **Date:** 03/27/2017     **Phone:** [phone]   **CHN#:** [id number]     **Address:** [address], [place], [place]     **Pcp:** [name]        * * *         **Subjective:**        ---       **Chief Complaints:**    --- ---         --- ---      **Medical History:** Diabetes , Chronic hypertension, Seizures as a child,  Pregnancy: yes.        --- ---      **Gyn History:** Hx Abnormal pap smear yes, with biopsy in past but no  treatment. Last pap 2015.. Menstrual History LMP 12/05/2016. STD  HPV/condyloma, HSV/Herpes - outbreaks rare. Pregnancy #1 Date- /2006, Del-  TAB, Comments- No complications.    --- ---      **OB History:** Previous Pregnancies Total Pregnancies: 1, Full Term: 0,  Premature: 0, AB. Induced: 0, AB. Spontaneous: 0, Ectopics: 0, Multiple  Births: 0, Living: 0\.    --- ---      **Social History:** Tobacco history: Formerly smoked quits when found out  pregnant. Marital Status  Single. Work/Occupation: employed full-time doind  Therapist, sports. Alcohol  Denies. Illicit drugs: Used marijuana 3-4 x per day  until pregnant then stopped. Lives with: Alone.    --- ---      **Medications:** Taking Aspirin Low Dose 81 MG Tablet Chewable 1 tablet  Orally Once a day, Taking Lantus 40u daily, Taking NIFEdipine 60mg  daily,  Taking Prenatal , Medication List reviewed and reconciled with the patient    --- ---       **Allergies:** Penicillin: rash.    --- ---        **Objective:**        ---       **Vitals:** Pain scale 0/10, Wt-lbs 212, BP UE 136, BP LE 73.    --- ---         **Assessment:**        ---       **Assessment:**    1\. Maternal chronic hypertension in second trimester - O10.912 (Primary)    2\. Other viral diseases complicating pregnancy, unspecified trimester -  O98.519    3\.  Diabetes mellitus affecting pregnancy in second trimester - O24.912    4\. Medication exposure during first trimester of pregnancy - O09.891    5\. Elderly multigravida in second trimester - O09.522    --- ---        **Plan:**        ---         **1\. Maternal chronic hypertension in second trimester**    Continue Aspirin Low Dose Tablet Chewable, 81 MG, 1 tablet, Orally, Once a day  .    ---    **2\. Others**    Continue NIFEdipine, 60mg , daily ; Continue Prenatal ; Continue Lantus, 40u,  daily .      **Procedure Codes:** [id number] PRENATAL VISIT    --- ---       **Follow Up:** 1 Week    --- ---    ---    ---          **  Provider:** Elnora Morrison, MD    ---     **Patient:** [name], [name] **DOB:** 1976/02/02 **Date:** 03/27/2017    ---    Electronically signed by Marlowe Kays , MD on 04/03/2017 at 11:32 AM EDT    Sign off status: Completed

## 2021-02-02 NOTE — Progress Notes (Signed)
**Progress Notes**    ---    **Patient:** [name], AVELLINOAccount Number:** [id number] **External MRN:** [id number]   **Provider:** Elnora Morrison, MD     **DOB:** 03-25-1976 **Age:** 69 Y **Sex:** Female   **Date:** 08/14/2017     **Phone:** [phone]   **CHN#:** [id number]     **Address:** [address], [place], [place]     **Pcp:** [name]        * * *         **Subjective:**        ---       **Chief Complaints:**    --- ---         --- ---      **Medical History:** Diabetes , Chronic hypertension, Seizures as a child,  Pregnancy: yes.        --- ---      **Gyn History:** Hx Abnormal pap smear yes, with biopsy in past but no  treatment. Last pap 2015.. Menstrual History LMP 12/05/2016. STD  HPV/condyloma, HSV/Herpes - outbreaks rare. Pregnancy #1 Date- /2006, Del-  TAB, Comments- No complications.    --- ---      **OB History:** Previous Pregnancies Total Pregnancies: 2, Full Term: 0,  Premature: 0, AB. Induced: 1, AB. Spontaneous: 0, Ectopics: 0, Multiple  Births: 0, Living: 0\.    --- ---      **Surgical History:** lap cholecystectomy 2010.    --- ---      **Social History:** Tobacco history: Never smoked.    --- ---      **Medications:** Taking Accu-Chek Aviva-Blood Glucose Test Strips 1 as  directed SQ 8 times per day, Taking Accu-Chek Softclix Lancets 1 Miscellaneous  as directed SQ 6 times per day, Taking Aspirin Low Dose 81 MG Tablet Chewable  1 tablet Orally Once a day, Taking BD Pen Needle Short U/F 31G X 8 MM  Miscellaneous as directed SQ 4 times a day, Taking Colace 100 MG Capsule 1  capsule as needed Orally Once a day, Taking Humalog KwikPen 100 UNIT/ML  Solution Pen-injector 3 units as directed (max 15 units daily) Subcutaneous  with meals, Taking Iron (Ferrous Gluconate) 256 (28 Fe) MG Tablet 1 tablet  Orally Once a day, Taking Lantus 100 UNIT/ML Solution 35 units (max of 45)  Subcutaneous Once a day, Taking NIFEdipine ER 60 MG Tablet Extended Release 24  Hour 1 tablet on an  empty stomach Orally Once a day, Taking Prenatal ,  Discontinued Tetanus-Diphth-Acell Pertussis 5-2.5-18.5 LF-MCG/0.5 Suspension  as directed Intramuscular , Medication List reviewed and reconciled with the  patient    --- ---       **Allergies:** Penicillin: rash.    --- ---        **Objective:**        ---       **Vitals:** Pain scale 8/10 hands, Ht-in 66, Wt-lbs 241, BMI 38.898, BP  130/77, BP UE 130, BP LE 77.    --- ---         **Assessment:**        ---       **Assessment:**    1\. Pre-existing type 2 diabetes mellitus during pregnancy in third trimester  - O24.113 (Primary)    2\. Herpesviral infection, unspecified - B00.9    3\. Other viral diseases complicating pregnancy, unspecified trimester -  O98.519    4\. Obesity (BMI 30.0-34.9) - E66.9    5\. Chronic hypertension during pregnancy - O10.919  6\. Elderly multigravida in third trimester - O09.523    7\. Pre-existing type 2 diabetes mellitus during pregnancy in second trimester  - O24.112    8\. Maternal chronic hypertension in second trimester - O10.912    --- ---        **Plan:**        ---         **1\. Other viral diseases complicating pregnancy, unspecified  trimester**    Start Valtrex Tablet, 500 mg, 1 tablet, Orally, twice a day, 30, 60, Refills 0  .    ---    **2\. Pre-existing type 2 diabetes mellitus during pregnancy in second  trimester**    Continue Accu-Chek Aviva-Blood Glucose Test Strips, 1, as directed, SQ, 8  times per day ; Continue Accu-Chek Softclix Lancets Miscellaneous, 1, as  directed, SQ, 6 times per day ; Continue BD Pen Needle Short U/F  Miscellaneous, 31G X 8 MM, as directed, SQ, 4 times a day ; Continue Humalog  KwikPen Solution Pen-injector, 100 UNIT/ML, 3 units as directed (max 15 units  daily), Subcutaneous, with meals ; Continue Lantus Solution, 100 UNIT/ML, 35  units (max of 45), Subcutaneous, Once a day .    **3\. Maternal chronic hypertension in second trimester**    Continue Aspirin Low Dose Tablet Chewable, 81 MG,  1 tablet, Orally, Once a day  .    **4\. Others**    Continue Colace Capsule, 100 MG, 1 capsule as needed, Orally, Once a day ;  Continue Iron (Ferrous Gluconate) Tablet, 256 (28 Fe) MG, 1 tablet, Orally,  Once a day ; Continue NIFEdipine ER Tablet Extended Release 24 Hour, 60 MG, 1  tablet on an empty stomach, Orally, Once a day ; Continue Prenatal .      **Immunizations:**    Flu Vaccine : 0.5 mL (Route: Intramuscular) given by Debbora Dus on Left Deltoid    --- ---       **Labs:**    --- ---    Gerald Stabs: Strep Group B Culture_    ---       **Procedure Codes:** 10272 FLU VAC NO PRSV 4 VAL 3 YRS+, 53664 PRENATAL  VISIT    --- ---       **Follow Up:** 1 Week, 2-3 Days, 1 Week    --- ---    ---    ---          **Provider:** Elnora Morrison, MD    ---     **Patient:** [name], [name] **DOB:** 07-28-1976 **Date:** 08/14/2017    ---    Electronically signed by Marlowe Kays , MD on 08/14/2017 at 01:37 PM EDT    Sign off status: Completed

## 2021-02-02 NOTE — Progress Notes (Signed)
* * *        **  [name]**    --- ---    79 Y old Female, DOB: [birth year]    [address], [place], [place] [address]    Home: [phone]    Provider: Vance Peper, MD        * * *    Telephone Encounter    ---    Answered by   Debbora Dus  Date: 11/08/2017         Time: 09:46 AM    Caller   [name]    --- ---            Reason   didn't take insulin            Message                      [name] returned phone call regarding elevated sugar of 303. Pt stated that she did not take her insulin yesterday morning and that she really needs to get back in to the swing of things. This rn explained the importancy and dangers of not taking her insulin. Dierdre Highman agrees. Pt did take her Lantus 30units sq this am and will check her sugars diligently and report sugars >200. Pt to keep her scheduled appt for Monday with Dr Vicenta Aly. Dr Vicenta Aly aware.                Action Taken   [name],[name] 11/08/2017 9:49:06 AM >                * * *                ---          * * *          PatientAPARNA, [name] DOB: February 24, 1976 Provider: Vance Peper, MD 11/08/2017    ---    Note generated by eClinicalWorks EMR/PM Software ([email or web address])

## 2021-02-02 NOTE — Progress Notes (Signed)
**Progress Notes**    ---    **Patient:** [name], DIGNANAccount Number:** [id number] **External MRN:** [id number]   **Provider:** Elnora Morrison, MD     **DOB:** [birth year] **Age:** 38 Y **Sex:** Female   **Date:** 07/31/2017     **Phone:** [phone]   **CHN#:** [id number]     **Address:** [address], [place], [place]     **Pcp:** [name]        * * *         **Subjective:**        ---       **Chief Complaints:**    --- ---         --- ---      **Medical History:** Diabetes , Chronic hypertension, Seizures as a child.        --- ---      **Gyn History:** Hx Abnormal pap smear yes, with biopsy in past but no  treatment. Last pap 2015.. Menstrual History LMP 12/05/2016. STD  HPV/condyloma, HSV/Herpes - outbreaks rare. Pregnancy #1 Date- /2006, Del-  TAB, Comments- No complications.    --- ---      **OB History:** Previous Pregnancies Total Pregnancies: 2, Full Term: 0,  Premature: 0, AB. Induced: 1, AB. Spontaneous: 0, Ectopics: 0, Multiple  Births: 0, Living: 0\.    --- ---      **Social History:** Tobacco history: Never smoked.    --- ---      **Medications:** Taking Accu-Chek Aviva-Blood Glucose Test Strips 1 as  directed SQ 8 times per day, Taking Accu-Chek Softclix Lancets 1 Miscellaneous  as directed SQ 6 times per day, Taking Aspirin Low Dose 81 MG Tablet Chewable  1 tablet Orally Once a day, Taking BD Pen Needle Short U/F 31G X 8 MM  Miscellaneous as directed SQ 4 times a day, Taking Colace 100 MG Capsule 1  capsule as needed Orally Once a day, Taking Humalog KwikPen 100 UNIT/ML  Solution Pen-injector 3 units as directed (max 15 units daily) Subcutaneous  with meals, Taking Lantus 100 UNIT/ML Solution 35 units (max of 45)  Subcutaneous Once a day, Taking NIFEdipine ER 60 MG Tablet Extended Release 24  Hour 1 tablet on an empty stomach Orally Once a day, Taking Prenatal    --- ---      **Allergies:** Penicillin: rash.    --- ---        **Objective:**        ---       **Vitals:** Pain  scale 7/10 hands numbness, Ht-in 66, Wt-lbs 238, BMI  38.414, BP 131/73, BP UE 131, BP LE 73.    --- ---         **Assessment:**        ---       **Assessment:**    1\. Pre-existing type 2 diabetes mellitus during pregnancy in third trimester  - O24.113 (Primary)    2\. Chronic hypertension during pregnancy - O10.919    3\. Elderly multigravida in third trimester - O09.523    6\. Other viral diseases complicating pregnancy, third trimester - O98.513    --- ---        **Plan:**        ---         **1\. Others**    Start Tetanus-Diphth-Acell Pertussis Suspension, 5-2.5-18.5 LF-MCG/0.5, as  directed, Intramuscular ; Start Iron (Ferrous Gluconate) Tablet, 256 (28 Fe)  MG, 1 tablet, Orally, Once a day, 30 day(s), 30 .    ---      **  Immunizations:**    Tdap : 0.5 mL (Route: Intramuscular) given by Debbora Dus on Left Deltoid    --- ---       **Procedure Codes:** [id number] Tdap, [id number] PRENATAL VISIT    --- ---       **Follow Up:** 2 Weeks    --- ---    ---    ---          **Provider:** Elnora Morrison, MD    ---     **Patient:** [name] **DOB:** 09/02/1976 **Date:** 07/31/2017    ---    Electronically signed by Marlowe Kays , MD on 07/31/2017 at 04:34 PM EDT    Sign off status: Completed

## 2021-02-02 NOTE — Progress Notes (Signed)
* * *        [name]**    --- ---    68 Y old Female, DOB: May 09, 1976    [address], [place], [place] [address]    Home: [phone]    Provider: [name], MD        * * *    Web Encounter    ---    Answered by   ENDOCRINE, ADMIN  Date: 05/02/2017         Time: 04:41 PM    Caller   [name]    --- ---            Reason   Rx for Humalog            Message                       Addressed To: [name], Cira Deyoe  <br/> Hi Dr. [name],      I'm still waiting on the Rx for Humalog from my visit on 6/21. [facility]. and they don't have anything on file/any new Rx. A pharmacist was going to reach out to you but I don't believe that has happened. The Rx needs to be a 90 day supply for insurance to cover.      [facility] :       [facility]      [address] [place], [place] [address]      [phone]      Thank you! [name], MRN [id number]                Action Taken   [name] , MD 05/03/2017 12:54:00 PM > , Prescription  was faxed/electronically transferred to pharmacy., Provided feedback to the  patient via portal messaging system.            Refills  Refill Humalog KwikPen Solution Pen-injector, 100 UNIT/ML,  Subcutaneous, 1 box, 3 units as directed (max 15 units daily), with meals, 90  days, Refills=4    --- ---      Refill BD Pen Needle Short U/F Miscellaneous, 31G X 8 MM, SQ, 4 boxes, as  directed, 4 times a day, 90 days, Refills=4          * * *         **eMessages**   From:   [name]    --- ---    Created:   2017-05-03 12:55:56    Sent:   2017-05-03 12:55:58    Subject:   RE:Rx for Humalog    Message:                      [name] , MD 05/03/2017 12:54:00 PM >    [name],    I sent the humalog pen and needles to the pharmacy. Not sure what happened,  but hopefully it will be filled today. Let me know if you need anything.    - Dr. Seth Bake.                      * * *        ---        Reason for Appointment    ---      1\. Rx for Humalog     ---         **Medical History:**   Diabetes .    ---    Chronic hypertension.    ---  Seizures as a child.    ---      Allergies    ---      Penicillin: rash    ---      Assessments    ---    1\. Pre-existing type 2 diabetes mellitus during pregnancy in second trimester  - O24.112    ---      Treatment    ---       **1\. Pre-existing type 2 diabetes mellitus during pregnancy in second  trimester**    Refill Humalog KwikPen Solution Pen-injector, 100 UNIT/ML, 3 units as directed  (max 15 units daily), Subcutaneous, with meals, 90 days, 1 box, Refills 4    Refill BD Pen Needle Short U/F Miscellaneous, 31G X 8 MM, as directed, SQ, 4  times a day, 90 days, 4 boxes, Refills 4    ---          * * *          PatientFAREN, [name] DOB: [birth year] Provider: [name], MD  05/02/2017    ---    Note generated by eClinicalWorks EMR/PM Software ([email or web address])

## 2021-02-02 NOTE — Progress Notes (Signed)
* * *        [name]**    --- ---    89 Y old Female, DOB: 1976/08/06, External MRN: [id number]    Account Number: [id number]    [address], [place], [place]    Home: [phone]    Insurance: HMO [employer] Payer ID: PAPER    PCP: [name] Referring: Elnora Morrison External Visit ID: [id number]    Appointment Facility: [facility]        * * *    08/27/2017  Progress Notes: Vance Peper, MD **CHN#:** [phone]    --- ---    ---        Current Medications    ---    Taking     * Accu-Chek Aviva-Blood Glucose Test Strips 1 as directed SQ 8 times per day    ---    * Accu-Chek Softclix Lancets 1 Miscellaneous as directed SQ 6 times per day    ---    * Aspirin Low Dose 81 MG Tablet Chewable 1 tablet Orally Once a day    ---    * BD Pen Needle Short U/F 31G X 8 MM Miscellaneous as directed SQ 4 times a day    ---    * Colace 100 MG Capsule 1 capsule as needed Orally Once a day    ---    * Humalog KwikPen 100 UNIT/ML Solution Pen-injector 3 units as directed (max 15 units daily) Subcutaneous with meals    ---    * Iron (Ferrous Gluconate) 256 (28 Fe) MG Tablet 1 tablet Orally Once a day    ---    * Lantus 100 UNIT/ML Solution 35 units (max of 45) Subcutaneous Once a day    ---    * NIFEdipine ER 60 MG Tablet Extended Release 24 Hour 1 tablet on an empty stomach Orally Once a day    ---    * Prenatal     ---    * Valtrex 500 mg Tablet 1 tablet Orally twice a day    ---    * Medication List reviewed and reconciled with the patient    ---      Past Medical History    ---       Diabetes .        ---    Chronic hypertension.        ---    Seizures as a child.        ---    Pregnancy: yes.        ---      Surgical History    ---      lap cholecystectomy 2010    ---      Social History    ---    Tobacco history: Never smoked.    Marital Status  Single.    Work/Occupation: employed .    Alcohol  not currently.   10/22: plans to stop work tomorrow, take some time  for herself before the  baby comes. things ready at home.    ---      Gyn History    ---    Hx Abnormal pap smear yes, with biopsy in past but no treatment. Last pap  2015..    Menstrual History LMP 12/05/2016.    STD HPV/condyloma, HSV/Herpes - outbreaks rare.    Pregnancy #1 Date- /2006, Del- TAB, Comments- No complications.      OB  History    ---    Previous Pregnancies Total Pregnancies: 2, Full Term: 0, Premature: 0, AB.  Induced: 1, AB. Spontaneous: 0, Ectopics: 0, Multiple Births: 0, Living: 0\.      Allergies    ---      Penicillin: rash    ---    [Allergies Verified]        Vital Signs    ---    Pain scale 5/10 legs swelling, Ht-in 66, Wt-lbs 251.9, BMI 40.658, BP 138/82,  147/80, BP UE 138/147, BP LE 82/80.      Assessments    ---    1\. Elderly multigravida in second trimester - O09.522 (Primary)    ---    2\. Diabetes mellitus affecting pregnancy in second trimester - O24.912    ---    3\. Maternal chronic hypertension in second trimester - O10.912    ---    4\. Obesity (BMI 30.0-34.9) - E66.9    ---      Procedure Codes    ---      161WR PRENATAL VISIT N/C MFM    ---      Follow Up    ---    1 Week, 1wk    Electronically signed by Jerene Dilling Riverlyn Kizziah MD on 08/31/2017 at 11:11 AM EDT    Sign off status: Completed        * * *        [facility]    [address]    [place], [place] [address]    Tel: [phone]    Fax: [phone]              * * *          Patient: [name], [name] DOB: 1976-07-05 Progress Note: Heidi Korell, MD  08/27/2017    ---    Note generated by eClinicalWorks EMR/PM Software ([email or web address])

## 2021-02-02 NOTE — Progress Notes (Signed)
* * *        [name]**    --- ---    45 Y old Female, DOB: August 11, 1976, External MRN: [id number]    Account Number: [id number]    [address], [place], [address]    Home: [phone]    Insurance: [employer] Payer ID: PAPER    PCP: [name] Referring: Vance Peper External Visit ID: [id number]    Appointment Facility: [facility]        * * *    10/22/2017  Progress Notes: Vance Peper, MD **CHN#:** [phone]    --- ---    ---        Current Medications    ---    Taking     * Accu-Chek Aviva-Blood Glucose Test Strips 1 as directed SQ 8 times per day    ---    * Accu-Chek Softclix Lancets 1 Miscellaneous as directed SQ 6 times per day    ---    * BD Pen Needle Short U/F 31G X 8 MM Miscellaneous as directed SQ 4 times a day    ---    * Colace 100 MG Capsule 1 capsule as needed Orally Once a day    ---    * Humalog KwikPen 100 UNIT/ML Solution Pen-injector 3 units as directed (max 15 units daily) Subcutaneous with meals    ---    * Lantus 100 UNIT/ML Solution 18units (max of 45) Subcutaneous Once a day    ---    * NIFEdipine ER 60 MG Tablet Extended Release 24 Hour 1 tablet on an empty stomach Orally Once a day    ---    * Prenatal     ---    Not-Taking/PRN    * Aspirin Low Dose 81 MG Tablet Chewable 1 tablet Orally Once a day    ---    * Iron (Ferrous Gluconate) 256 (28 Fe) MG Tablet 1 tablet Orally Once a day    ---    * Valtrex 500 mg Tablet 1 tablet Orally twice a day    ---    * Medication List reviewed and reconciled with the patient    ---      Past Medical History    ---       Diabetes .        ---    Chronic hypertension.        ---    Seizures as a child.        ---    Breast Feeding: yes.        ---      Surgical History    ---      lap cholecystectomy 2010    ---    cesarean delivery 2018    ---      Family History    ---      Mother: alive 81 yrs, diagnosed with Diabetes, Hypertension    ---    Father: deceased, diagnosed with Heart Disease in 44    ---    Maternal  Grand Mother: alive, diagnosed with Diabetes, Hypertension    ---    Parnter is 30 and is [place]. No fam hx of congen anomalies, MR, stillbirth,  sickle cell disease.    Father- H/o CHF    Mother - h/o diabetes and HTN.    ---      Social History    ---    Tobacco history: Never  smoked.    Marital Status  Single.    Work/Occupation: employed .    Alcohol  not currently.   10/22: plans to stop work tomorrow, take some time  for herself before the baby comes. things ready at home.    11/12: feeling tired. good support at home. baby doing well - came home from  the nicu last week. no DV. not feeling depressed. EDS negative today.    ---      Gyn History    ---    Hx Abnormal pap smear yes, with biopsy in past but no treatment. Last pap  2015..    Menstrual History LMP 12/05/2016.    STD HPV/condyloma, HSV/Herpes - outbreaks rare.    Pregnancy #1 Date- /2006, Del- TAB, Comments- No complications.      OB History    ---    Previous Pregnancies Total Pregnancies: 2, Full Term: 0, Premature: 0, AB.  Induced: 1, AB. Spontaneous: 0, Ectopics: 0, Multiple Births: 0, Living: 0\.      Allergies    ---      Penicillin: rash    ---    Forrestine Him Verified]      Hospitalization/Major Diagnostic Procedure    ---      childbirth 2018    ---      Review of Systems    ---    see above. no fever/chills. no sob or chest pain. no abdominal pain. still  light vaginal bleeding. no cramps. LE edema bilaterally with mild tenderness  bilaterally.        Reason for Appointment    ---      1\. Delivered 09/04/17    ---      History of Present Illness    ---     _GENERAL_ :    45yo now P1 here for 6wk pp visit. Still has LE swelling bilaterally that is  uncomfortable for her. Improves with elevation at night. Also still has carpal  tunnel symptoms, and had left wrist injection today for symptomatic relief.  Has tingling and numbness in first three digits bilaterally.      Vital Signs    ---    Pain scale 6/10 legs swelling, Ht-in 66, Wt-lbs  245, BMI 39.54, BP 129/76.      Physical Examination    ---     _GENERAL_ :    General Appearance: Looks Healthy, well-groomed, alert and oriented, pleasant.  Mood/Affect: pleasant, happy. Abdomen soft, obese, nontender. Extremities 3+  edema bilaterally, symmetric, mildly tender bilaterally, but no severe  tenderness or point tenderness concerning for DVT.[name]          Assessments    ---    1\. Chronic hypertension affecting pregnancy - O10.919 (Primary)    ---    2\. Encounter for postpartum visit - Z39.2    ---    3\. Long term current use of insulin - Z79.4    ---    4\. Type 2 diabetes mellitus without complications - E11.9    ---    5\. Encounter for other general counseling or advice on contraception - Z30.09    ---      Treatment    ---       **1\. Chronic hypertension affecting pregnancy**    Start Hydrochlorothiazide Tablet, 25 mg, 1 tablet in the morning, Orally, Once  a day, 30 day(s), 30, Refills 3    Stop NIFEdipine ER Tablet Extended Release 24 Hour, 60 MG, 1 tablet  on an  empty stomach, Orally, Once a day    Notes: still with 3+ edema LE bilaterally and carpal tunnel symptoms (which  developed for the first time during pregnancy). BP normal range today on  nifedipine XL 60mg  daily. Discussed history of cHTN today. Ms. Neyer was on  lisinopril prior to pregnancy, stopped when found out she was pregnant. Still  breastfeeding and bottle feeding.    Appreciate phone consultation with cardiology fellow Dr. Martin Majestic today.  Discussed options for transitioning to diurretic medication. Possible  nifedipine is contributing to edema. Will stop nifedipine. Will START HCTZ  25mg  PO daily (will take tomorrow in am instead of nifedipine). 1wk too early  for patient to return for follow up due to appts for baby girl next week. Will  return for follow up in 2 weeks on 12/31. Advised to increased potassium in  diet with bananas, gatorade, for example. Will plan BP check and Chem 7 for  monitoring in 2 weeks. If BP  elevated, ok to increase HCTZ to 50mg  daily.  Hypo/hypertension precautions reviewed. To call if any symptoms prior to  scheduled f/u visit.    pt to fax FMLA paperwork. important for improvement of edema and BP control  prior to return to work after cesarean delivery.    ---        **2\. Encounter for postpartum visit**    Notes: baby well at home. still breast and bottle feeding. depression screen  negative. does not want full physical exam today. does not want Mirena IUD  today. knows she needs a pap with her next physical for cervical cancer  screening. Will plan for full physical exam and mirena iud insertion on 12/31  at time of follow up. (note post placental mirena iud placed, but expulsed at  the time of pp hemorrhage.).        **3\. Type 2 diabetes mellitus without complications**    Notes: continues on lantus 35u daily. reports FS well controlled although did  not bring log today.      Follow Up    ---    2 Weeks    Electronically signed by Jerene Dilling Hanadi Stanly MD on 10/22/2017 at 02:50 PM EST    Sign off status: Completed        * * *        [facility]    [address]    [place], [place] [address]    Tel: [phone]    Fax: [phone]              * * *          Patient: [name], [name] DOB: 19-Dec-1975 Progress Note: Takhia Spoon, MD  10/22/2017    ---    Note generated by eClinicalWorks EMR/PM Software ([email or web address])

## 2021-02-02 NOTE — Progress Notes (Signed)
* * *        **  [name]**    --- ---    24 Y old Female, DOB: 1976/02/17    [address], [place], [place] [address]    Home: [phone]    Provider: Marlowe Kays D        * * *    Telephone Encounter    ---    Answered by   Curt Jews  Date: 03/21/2017         Time: 06:26 PM    Caller   ME    --- ---            Reason   give [place] nut mfm appt 03/27/17            Message                      lvm that [place] on 03/27/17 is now scheduled at 1:00 pm, mfm clinic at 2:30 pm, and nutrition at 3:30 pm. pt was left instructions to call and confirm these appts                    * * *                ---          * * *          Patient: [name] DOB: 1976-03-31 Provider: Marlowe Kays D  03/21/2017    ---    Note generated by eClinicalWorks EMR/PM Software ([email or web address])

## 2021-02-02 NOTE — Progress Notes (Signed)
* * *        [name]**    --- ---    25 Y old Female, DOB: 04/29/1976, External MRN: [id number]    Account Number: [id number]    [address], [place], [id number]    Home: [phone]    Insurance: HMO [employer]    PCP: [name] Referring: [name]    Appointment Facility: Endocrinology        * * *    06/07/2017  Progress Notes: [name], MD **CHN#:** [phone]    --- ---    ---        Reason for Appointment    ---      1\. NP/DIABETIC RETINOPATHYPER DR.TISHLER    ---    2\. Diabetes Type 2    ---      History of Present Illness    ---     _GENERAL_ :    45 yo F with history of DM II, HTN, HLD, and currently [redacted] wks pregnant, who  presents for diabetes management during pregnancy. Patient was diagnosed in  her early 20's with type II Diabetes and was on metformin at some point, but  has been on insulin for the past few years (<1 decade per patient). She has  only been on lantus only. For the most part, patient says that the lantus has  kept her diabetes under good control.    Patient says that she has severe dry eye with blurry vision that's worse after  staring at the computer for too long. Dr. [name] tried to get her to see an  opthalmologist, and she saw one in April, but her eyes were not dilated for  the exam as they found out that she was pregnant.    Patient denies any symptoms of hyperglycemia or hypoglycemia at home.    ---    Interval Hx    - Labs: not done for today, but recent HbA1c is 6.3% 05/03/17.    - BG numbers: (Accuchek aviva meter) and libre sensor approved and using  since 7/24. Average for last 9 days was 105 mg/dL (~2.4%)    - Meds: lantus 35 units Qam, humalog 3 units Qpc (after meals)    - Diet: eating more often and smaller meals more often.    - Activity: walks a couple of times a week for 15-16min each time and will  lift weights at home occasionally.    - She is [redacted]w[redacted]d today (EDD 09/11/17).    - Today the patient denies excessive thirst,  polyuria, HA, light headedness,  SOB, CP, abdominal pain, dysphagia, nausea, vomiting, diarrhea, fever or  chills.    ---    HbA1c    6.3% 05/03/17    8.1% 02/13/2017    ---    Diet    B - mixed graincheerios with almond milk, boiled eggs    L - [place] sandwich/healthy choice meals    D - salad/fish/steak    S - mixed nuts, cucumbers, carrots.      Current Medications    ---    Taking     * Accu-Chek Aviva-Blood Glucose Test Strips 1 as directed SQ 8 times per day    ---    * Accu-Chek Softclix Lancets 1 Miscellaneous as directed SQ 6 times per day    ---    * Aspirin Low Dose 81 MG Tablet Chewable 1 tablet Orally Once a day    ---    *  BD Pen Needle Short U/F 31G X 8 MM Miscellaneous as directed SQ 4 times a day    ---    * Colace 100 MG Capsule 1 capsule as needed Orally Once a day    ---    * Humalog KwikPen 100 UNIT/ML Solution Pen-injector 3 units as directed (max 15 units daily) Subcutaneous with meals    ---    * Lantus 100 UNIT/ML Solution 35 units (max of 45) Subcutaneous Once a day    ---    * NIFEdipine ER 60 MG Tablet Extended Release 24 Hour 1 tablet on an empty stomach Orally Once a day    ---    * Prenatal     ---    * Medication List reviewed and reconciled with the patient    ---      Past Medical History    ---       Diabetes .        ---    Chronic hypertension.        ---    Seizures as a child.        ---      Surgical History    ---      lap cholecystectomy 2010    ---      Family History    ---      Mother: alive 24 yrs, diagnosed with Diabetes, Hypertension    ---    Father: deceased, diagnosed with Heart Disease in 60    ---    Maternal Grand Mother: alive, diagnosed with Diabetes, Hypertension    ---    Parnter is 29 and is [place]. No fam hx of congen anomalies, MR, stillbirth,  sickle cell disease.    Father- H/o CHF    Mother - h/o diabetes and HTN.    ---      Social History    ---    Tobacco    history: _Never smoked_       Allergies    ---      Penicillin: rash    ---      Review of  Systems    ---    A complete review of systems was negative except for those mentioned in the  HPI.      Vital Signs    ---    Pain scale 0, Ht-in 66, Wt-lbs 227, BMI 36.63, BP 123/79, HR 95, BSA 2.19, O2  99, Ht-cm 167.64, Wt-kg 102.97, Wt Change 1.5 lb.      Physical Examination    ---     _Endo: [facility]_ :    General: appears well, no distress.    HEENT: NC/AT, EOMI, no proptosis, no lid lag.    Neck: no erythema, no tracheal deviation, negative Pemberton's sign,  acanthosis nigricans noted.    Lungs: good air entry, clear bilaterally.    Heart: S1 & S2 normal, regular rate & rhythm, no murmurs, rubs, or gallops.    Abdomen: soft, non-tender, non-distended, obese.    Extremities: no tremor in hands bilaterally, no clubbing, cyanosis, or edema,  good muscle mass.    Neurology: normal gait, normal 5/5 strength, no proximal myopathy.    Dermatology: no skin dryness, anicteric.    Psych: no anxiety, no depression.          Assessments    ---    1\. Pre-existing type 2 diabetes mellitus during pregnancy in second trimester  - O24.112 (Primary)    ---  2\. Obesity (BMI 30.0-34.9) - E66.9    ---    3\. Essential hypertension - I10    ---      \---    1\. Diabetes type 2 during pregnancy    She has been doing much better with her diabetic control as well as eating.  She is using Lantus 35 and Humalog after meals. She will move the Humalog to  before meals which should help greatly with her postprandial spikes. We also  advised to try and use a carb ratio to change the dosing for the meals. Will  use 1:15 ICR which will help with her smaller snacks that she will eat more  often now in the second trimester. For today:    - Continue lantus at 35 units QAM    - Continue humalog at 3 units TID before meals, but she will start to carb  count using 1:15 ICR.    - Went over insulin injections and to move the shots around (can also inject  underneath arms or in thighs)    - Continue on the [place] sensors and bring in  reader each visit.    - Follow up in 8 weeks with labs at that time.    - libre sensor CGM downloaded and interpreted today to help with insulin  titration and blood sugar control.    ---    2\. Diabetes associated problems    - HTN: continue home nifedipine    - HLD: currently holding statin given pregnancy    - Eyes: Vision has improved although I still encouraged patient to go back to  ophtho for proper eye exam    - Kidneys: normal Cr 0.8 in April/2018    - Feet: normal foot exam with no loss of sensation/numbness tingling    - CAD: asymptomatic currently, denies chest pain. Stress ECHO on 02/13/17  negative for signs of ischemia    ---    3\. obesity/nutrition    She continues to have a good diet currently and exercises most days (lightly).  Encouraged to walk for 3-4 times a week instead of high intensity  exercises. Advised patient to maintain current weight during pregnancy, but  can try to lose weight again after delivery.    ---      Treatment    ---       **1\. Pre-existing type 2 diabetes mellitus during pregnancy in second  trimester**    Start Accu-Chek Aviva-Blood Glucose Test Strips, 1, as directed, SQ, 8 times  per day, 90 days, 900, Refills 3    Start Accu-Chek Softclix Lancets Miscellaneous, 1, as directed, SQ, 6 times  per day, 90 days, 900, Refills 3    Notes: 1. Check blood sugars upon waking before meals 1-2 after meals. Also  check before bed this should equate to around 8 times daily.    2\. Get lab work before next visit    3\. Continue Lantus to 35 units    4\. Continue Humalog 3 units before meals and start to titrate dose for 1 unit  for every 15 grams of carbohydrates.    5\. Continue libre sensors and can come in early to upload reader in between  visits.    ---      Procedure Codes    ---      63875 CONTINUOS GLUCOSE MON OFF SUP    ---    95251 GLUC MONITOR, CONT, PHYS I&R    ---  Follow Up    ---    already scheduled    Electronically signed by [name] MD on  06/07/2017 at 05:45 PM EDT    Sign off status: Completed        * * *        Endocrinology    [address]    [place], [place] [address]    Tel: [phone]    Fax: [phone]              * * *          Patient: ASRA, GAMBREL DOB: 01/20/1976 Progress Note: [name], MD  06/07/2017    ---    Note generated by eClinicalWorks EMR/PM Software ([email or web address])

## 2021-02-02 NOTE — Progress Notes (Signed)
* * *        [name]**    --- ---    45 Y old Female, DOB: November 20, 1975, External MRN: [id number]    Account Number: [id number]    [address], [place], [place]    Home: [phone]    Insurance: HMO [employer]    PCP: [name] Referring: [name]    Appointment Facility: Endocrinology        * * *    08/06/2017  Progress Notes: [name], MD **CHN#:** [id number]    --- ---    ---        Reason for Appointment    ---      1\. NP/DIABETIC RETINOPATHYPER DR.TISHLER    ---    2\. Diabetes Type 2    ---      History of Present Illness    ---     _GENERAL_ :    45 yo F with history of DM II, HTN, HLD, and currently [redacted] wks pregnant, who  presents for diabetes management during pregnancy. Patient was diagnosed in  her early 20's with type II Diabetes and was on metformin at some point, but  has been on insulin for the past few years (<1 decade per patient). She has  only been on lantus only. For the most part, patient says that the lantus has  kept her diabetes under good control.    Patient says that she has severe dry eye with blurry vision that's worse after  staring at the computer for too long. Dr. [name] tried to get her to see an  opthalmologist, and she saw one in April, but her eyes were not dilated for  the exam as they found out that she was pregnant.    Patient denies any symptoms of hyperglycemia or hypoglycemia at home.    ---    Interval Hx    - Labs: not done for today, but recent HbA1c is 6.3% 05/03/17.    - BG numbers: (Accuchek aviva meter) and libre sensor approved and using  since 7/24. Average for last 9 days was 105 mg/dL (~5.7%)    - Meds: lantus 35 units Qam, humalog 3 units Qpc (after meals)    - Diet: eating more often and smaller meals more often.    - Activity: walks a couple of times a week for 15-41min each time and will  lift weights at home occasionally.    - She is [redacted]w[redacted]d today (EDD 09/11/17).    - Today the patient denies excessive thirst,  polyuria, HA, light headedness,  SOB, CP, abdominal pain, dysphagia, nausea, vomiting, diarrhea, fever or  chills.    ---    HbA1c    6.3% 05/03/17    8.1% 02/13/2017    ---    Diet    B - mixed graincheerios with almond milk, boiled eggs    L - [place] sandwich/healthy choice meals    D - salad/fish/steak    S - mixed nuts, cucumbers, carrots.      Current Medications    ---    Taking     * Accu-Chek Aviva-Blood Glucose Test Strips 1 as directed SQ 8 times per day    ---    * Accu-Chek Softclix Lancets 1 Miscellaneous as directed SQ 6 times per day    ---    * Aspirin Low Dose 81 MG Tablet Chewable 1 tablet Orally Once a day    ---    *  BD Pen Needle Short U/F 31G X 8 MM Miscellaneous as directed SQ 4 times a day    ---    * Colace 100 MG Capsule 1 capsule as needed Orally Once a day    ---    * Humalog KwikPen 100 UNIT/ML Solution Pen-injector 3 units as directed (max 15 units daily) Subcutaneous with meals    ---    * Iron (Ferrous Gluconate) 256 (28 Fe) MG Tablet 1 tablet Orally Once a day    ---    * Lantus 100 UNIT/ML Solution 35 units (max of 45) Subcutaneous Once a day    ---    * NIFEdipine ER 60 MG Tablet Extended Release 24 Hour 1 tablet on an empty stomach Orally Once a day    ---    * Prenatal     ---    * Tetanus-Diphth-Acell Pertussis 5-2.5-18.5 LF-MCG/0.5 Suspension as directed Intramuscular     ---    * Medication List reviewed and reconciled with the patient    ---      Past Medical History    ---       Diabetes .        ---    Chronic hypertension.        ---    Seizures as a child.        ---      Surgical History    ---      lap cholecystectomy 2010    ---      Family History    ---      Mother: alive 33 yrs, diagnosed with Diabetes, Hypertension    ---    Father: deceased, diagnosed with Heart Disease in 69    ---    Maternal Grand Mother: alive, diagnosed with Diabetes, Hypertension    ---    Parnter is 31 and is [place]. No fam hx of congen anomalies, MR, stillbirth,  sickle cell disease.     Father- H/o CHF    Mother - h/o diabetes and HTN.    ---      Social History    ---    Tobacco    history: _Never smoked_       Allergies    ---      Penicillin: rash    ---      Review of Systems    ---    A complete review of systems was negative except for those mentioned in the  HPI.      Vital Signs    ---    Pain scale 7 hands, Ht-in 66, Wt-lbs 240, BMI 38.73, BP 135/77, HR 93, RR 16,  Temp 98.2, Oxygen sat % 100.      Physical Examination    ---     _Endo: Thyroid Clinic_ :    General: appears well, no distress.    HEENT: NC/AT, EOMI, no proptosis, no lid lag.    Neck: no erythema, no tracheal deviation, negative Pemberton's sign,  acanthosis nigricans noted.    Lungs: good air entry, clear bilaterally.    Heart: S1 & S2 normal, regular rate & rhythm, no murmurs, rubs, or gallops.    Abdomen: soft, non-tender, non-distended, obese.    Extremities: no tremor in hands bilaterally, no clubbing, cyanosis, or edema,  good muscle mass.    Neurology: normal gait, normal 5/5 strength, no proximal myopathy.    Dermatology: no skin dryness, anicteric.    Psych: no anxiety, no  depression.          Assessments    ---    1\. Pre-existing type 2 diabetes mellitus during pregnancy in second trimester  - O24.112 (Primary)    ---    2\. Obesity (BMI 30.0-34.9) - E66.9    ---    3\. Essential hypertension - I10    ---      \---    1\. Diabetes type 2 during pregnancy    She has been doing much better with her diabetic control as well as eating.  She is using Lantus 35 and 3 units Humalog before meals (uses after  occasionally). She needs to check more often more than 3 times a day to get a  full CGM analysis. Additionally has spikes after large meals will go up to 5  units before these bigger meals. After birth will probably need to come down  on the Lantus to 20- 25 units and hold on preprandial meals bringing sensor  data and so we can adjust insulin in between her visits. For today:    - Continue lantus at 35 units QAM  (will most likely need to reduce after  giving birth to 20-25 units daily).    - Continue humalog at 3 units TID before meals and 5 units for the bigger  meals like [employer] (did ask to curb eating these larger fattier meals).    - Went over insulin injections and to move the shots around (can also inject  underneath arms or in thighs)    - Continue on the [place] sensors and bring in reader each visit.    - labs today.    - libre sensor CGM downloaded and interpreted today to help with insulin  titration and blood sugar control.    ---    2\. Diabetes associated problems    - HTN: continue home nifedipine    - HLD: currently holding statin given pregnancy    - Eyes: Vision has improved although I still encouraged patient to go back to  ophtho for proper eye exam    - Kidneys: normal Cr 0.8 in April/2018    - Feet: normal foot exam with no loss of sensation/numbness tingling    - CAD: asymptomatic currently, denies chest pain. Stress ECHO on 02/13/17  negative for signs of ischemia    ---    3\. obesity/nutrition    She continues to have a good diet although does cheat with [employer]  occasionally which shows on her BG analysis and CGM data. Again, encouraged to  walk for 3-4 times a week instead of high intensity exercises. Advised  patient to maintain current weight during pregnancy, but can try to lose  weight again after delivery.    ---    40-minute appointment of which greater than 50% was spent counseling patient  on diabetic treatment as well as dietary modifications and insulin use during  pregnancy.    ---      Treatment    ---       **1\. Pre-existing type 2 diabetes mellitus during pregnancy in second  trimester**    _LAB: Hemoglobin A1c (A1C; glycohemoglobin)_    _LAB: Basic Metabolic Panel (BMP)_    _LAB: Lipid Profile (LIPID)_    _LAB: Albumin, random urine w/ creat (UALB)_    _LAB: Fructosamine (FRUCT; glycated proteins)_    _LAB: Liver Function Tests (LFT)_    Notes: 1. Get lab work today     2\. Continue Lantus  to 35 units (may need to reduce it after birth to 20-25  units daily)    3\. Continue Humalog 3 units before small regular meals 5 units for the larger  meals. May not need this after giving birth.    4\. Continue libre sensors and can come in early to upload reader in between  visits so that we can see if changes are needed to insulin dosing.    ---      Follow Up    ---    6-8 weeks, labs today    Electronically signed by [name] MD on 08/06/2017 at 05:19 PM EDT    Sign off status: Completed        * * *        Endocrinology    [address]    [place], [place] [address]    Tel: [phone]    Fax: [phone]              * * *          Patient: [name], [name] DOB: [birth year] Progress Note: [name], MD  08/06/2017    ---    Note generated by eClinicalWorks EMR/PM Software ([email or web address])

## 2021-02-02 NOTE — Progress Notes (Signed)
* * *        [name]**    --- ---    45 Y old Female, DOB: November 26, 1975, External MRN: [id number]    Account Number: [id number]    [address], [place], [place]    Home: [phone]    Insurance: HMO [employer] Payer ID: PAPER    PCP: [name] Referring: [name] External Visit ID: [id number]    Appointment Facility[facility]        * * *    08/21/2017  Progress Notes: Jimmy Picket, MD **CHN#:** [phone]    --- ---    ---        Current Medications    ---    Taking     * Accu-Chek Aviva-Blood Glucose Test Strips 1 as directed SQ 8 times per day    ---    * Accu-Chek Softclix Lancets 1 Miscellaneous as directed SQ 6 times per day    ---    * Aspirin Low Dose 81 MG Tablet Chewable 1 tablet Orally Once a day    ---    * BD Pen Needle Short U/F 31G X 8 MM Miscellaneous as directed SQ 4 times a day    ---    * Colace 100 MG Capsule 1 capsule as needed Orally Once a day    ---    * Humalog KwikPen 100 UNIT/ML Solution Pen-injector 3 units as directed (max 15 units daily) Subcutaneous with meals    ---    * Iron (Ferrous Gluconate) 256 (28 Fe) MG Tablet 1 tablet Orally Once a day    ---    * Lantus 100 UNIT/ML Solution 35 units (max of 45) Subcutaneous Once a day    ---    * NIFEdipine ER 60 MG Tablet Extended Release 24 Hour 1 tablet on an empty stomach Orally Once a day    ---    * Prenatal     ---    * Valtrex 500 mg Tablet 1 tablet Orally twice a day    ---    * Medication List reviewed and reconciled with the patient    ---      Past Medical History    ---       Diabetes .        ---    Chronic hypertension.        ---    Seizures as a child.        ---      Surgical History    ---      lap cholecystectomy 2010    ---      Family History    ---      Mother: alive 57 yrs, diagnosed with Hypertension, Diabetes    ---    Father: deceased, diagnosed with Heart Disease in 72    ---    Maternal Grand Mother: alive, diagnosed with Diabetes, Hypertension    ---     Parnter is 19 and is [place]. No fam hx of congen anomalies, MR, stillbirth,  sickle cell disease.    Father- H/o CHF    Mother - h/o diabetes and HTN.    ---      Social History    ---    Tobacco history: Never smoked.    Marital Status  Single.    Work/Occupation: employed .    Alcohol  not currently.  Allergies    ---      Penicillin: rash    ---    [Allergies Verified]      Hospitalization/Major Diagnostic Procedure    ---      No Hospitalization History.    ---      Review of Systems    ---     _ORT_ :    Eyes No. Ear, Nose Throat No. Digestion, Stomach, Bowel No. Bladder Problems  No. Bleeding Problems No. Numbness/Tingling Yes. Anxiety/Depression Yes.  Fever/Chills/Fatigue No. Chest Pain/Tightness/Palpitations No. Skin Rash No.  Dental Problems No. Joint/Muscle Pain/Cramps Yes. Blackout/Fainting No. Other  No.            Reason for Appointment    ---      1\. Bilateral CTS    ---      History of Present Illness    ---     _GENERAL_ :    [name] is a 45yo RHD female who presents with pain, numbness/tingling for the  last couple of months. She tried a splint one day/night but did not feel it  helped. She had difficulty keeping the splint on all night and didn't feel it  helped during the day at work while she was typing. SHe does have underlying  insulin-dependent diabetes.      Vital Signs    ---    Pain scale 8, Ht-in 66, Wt-lbs 240, BMI 22.59, BSA 1.72, Ht-cm 167.64, Wt-kg  63.5, Wt Change -101 lb.      Physical Examination    ---    Awake, alert, NAD    BIlateral UE: sens decreased to light touch in thumb, IF, MF. Sens intact in  small finger. Cap refill intact in all digits: full digital ROM. NO  thenar/intrinsic atrophy; CTC/phalens test does not increase baseling  tingling;.      Assessments    ---    1\. Bilateral carpal tunnel syndrome - G56.03 (Primary)    ---      Treatment    ---       **1\. Bilateral carpal tunnel syndrome**    Notes: SHe has carpal tunnel syndrome assoc with pregnancy. She  has not  tolerated splints although I did suggest she straighten the metal piece in the  splint and try them again. DUe to her DM, I would only inject one side today.  She was counselled that her sugars may increase over the next several days and  she should adjust her insulin accordingly. If her sugars have normalized, I  would consider injecting the left side next week; however, if her sugars are  still abnormal next week, she will need to postpone the injection another  week. She was also counselled that normally this resolves itself after  pregnancy, although not in everyone.    ---      Procedures    ---    The right carpal tunnel was injected under sterile technique today with 1 cc  of cortisone and 1 cc of 1% lidocaine without difficulty. The usual post  injection instructions were given (20526).      Procedure Codes    ---      84696 Injection carpal tunnel    ---    E9528 [place]    ---      Follow Up    ---    1 Week    Electronically signed by Jimmy Picket , M.D. on 08/21/2017 at 01:43 PM EDT    Sign off  status: Completed        * * [facility]    [address]    [place], [place] [address]    Tel: [phone]    Fax: [phone]              * * *          Patient: [name], [name] DOB: [birth year] Progress Note: Jimmy Picket, MD  08/21/2017    ---    Note generated by eClinicalWorks EMR/PM Software ([email or web address])

## 2021-02-02 NOTE — Progress Notes (Signed)
* * *        [name]**    --- ---    15 Y old Female, DOB: [birth year], External MRN: [id number]    Account Number: [id number]    [address], [place], [id number]    Home: [phone]    Insurance: HMO [employer] [employer]    PCP: [name] Referring: [name]    Appointment Facility: Endocrinology        * * *    04/26/2017  Progress Notes: Heidi Payor, MD **CHN#:** [phone]    --- ---    ---        Reason for Appointment    ---      1\. NP/DIABETIC RETINOPATHYPER HeidiTISHLER    ---    2\. Diabetes Type 2    ---      History of Present Illness    ---     _GENERAL_ :    45 yo F with history of DM II, HTN, HLD, and currently [redacted] wks pregnant, who  presents for diabetes management during pregnancy. Patient was diagnosed in  her early 20's with type II Diabetes and was on metformin at some point, but  has been on insulin for the past few years (<1 decade per patient). She has  only been on lantus only. For the most part, patient says that the lantus has  kept her diabetes under good control.    Patient says that she has severe dry eye with blurry vision that's worse after  staring at the computer for too long. [name] tried to get her to see an  opthalmologist, and she saw one in April, but her eyes were not dilated for  the exam as they found out that she was pregnant.    Patient denies any symptoms of hyperglycemia or hypoglycemia at home.    Today the patient denies excessive thirst, polyuria, HA, light headedness,  SOB, CP, abdominal pain, dysphagia, nausea, vomiting, diarrhea, fever or  chills.    ---    HbA1c    8.1% 02/13/2017    ---    Blood Sugar readings ( meter)    - Fasting: 70s-80s    - B: 125-130    - L: 130s-150s    - D: 130s-140s    ---    Activity    - walks a couple of times a week for 15-23min each time    - lift weights at home sometimes    ---    Diet    B - mixed graincheerios with almond milk, boiled eggs    L - [place] sandwich/healthy choice meals    D -  salad/fish/steak    S - mixed nuts, cucumbers, carrots    ---    Diabetes meds    lantus 42 units QAM.      Current Medications    ---    Taking     * Aspirin Low Dose 81 MG Tablet Chewable 1 tablet Orally Once a day    ---    * Colace 100 MG Capsule 1 capsule as needed Orally Once a day    ---    * Lantus 100 UNIT/ML Solution 42 UNITS Subcutaneous Once a day    ---    * NIFEdipine ER 60 MG Tablet Extended Release 24 Hour 1 tablet on an empty stomach Orally Once a day    ---    * Prenatal     ---    *  Medication List reviewed and reconciled with the patient    ---      Past Medical History    ---       Diabetes .        ---    Chronic hypertension.        ---    Seizures as a child.        ---    Pregnancy: yes.        ---      Surgical History    ---      lap cholecystectomy 2010    ---      Family History    ---      Mother: alive 89 yrs, diagnosed with Diabetes, Hypertension    ---    Father: deceased, diagnosed with Heart Disease in 50    ---    Maternal Grand Mother: alive, diagnosed with Diabetes, Hypertension    ---    Parnter is 44 and is [place]. No fam hx of congen anomalies, MR, stillbirth,  sickle cell disease.    Father- H/o CHF    Mother - h/o diabetes and HTN.    ---      Social History    ---    Tobacco    history: _Formerly smoked quits when found out pregnant_    Marital Status    _Single_    Work/Occupation: employed full-time doing Therapist, sports.    Alcohol    _Denies_    Illicit drugs: Used marijuana 3-4 x per day until pregnant then stopped.    Lives with: Alone.      Allergies    ---      Penicillin: rash    ---      Review of Systems    ---     _Endo: Bone_ :    Constitutional: no fever, no abnormal weight change, no chills. Eyes: some  blurriness in vision at times after long-time screen-time. Head: no sore  throat, no ear ache, no congestion, no hair loss. Cardiovascular: no chest  pain, no palpitations, no lightheadedness. Pulmonary: no cough, no shortness  of breath. GI: no abdominal  pain, no diarrhea, no vomiting, no nausea, no  epigastric pain; some constipation. GU: no polyuria, no nocturia, no  hematuria, no dysuria, no hx of kidney stones. Skin: no rashes, no lesions, no  edema, no abnormal striae, no easy bruising, wound healing normal.    A complete review of systems was negative except for those mentioned in the  HPI.      Vital Signs    ---    Pain scale 0, Ht-in 66, Wt-lbs 217, BMI 35.02, BP 127/78, HR 81, BSA 2.14, O2  100, Ht-cm 167.64, Wt-kg 98.43, Wt Change 4 lb.      Physical Examination    ---     _Endo: Thyroid Clinic_ :    General: appears well, no distress.    HEENT: NC/AT, EOMI, no proptosis, no lid lag.    Neck: no erythema, no tracheal deviation, negative Pemberton's sign,  acanthosis nigricans noted.    Lungs: good air entry, clear bilaterally.    Heart: S1 & S2 normal, regular rate & rhythm, no murmurs, rubs, or gallops.    Abdomen: soft, non-tender, non-distended, obese.    Extremities: no tremor in hands bilaterally, no clubbing, cyanosis, or edema,  good muscle mass.    Neurology: normal gait, normal 5/5 strength, no proximal myopathy.    Dermatology: no skin dryness,  anicteric.    Psych: no anxiety, no depression.          Assessments    ---    1\. Pre-existing type 2 diabetes mellitus during pregnancy in second trimester  - O24.112 (Primary)    ---    2\. Obesity (BMI 30.0-34.9) - E66.9    ---    3\. Essential hypertension - I10    ---      \---    1\. Diabetes type 2 during pregnancy    - patient's BS appears to be mostly under good control with diet/exercise  along with lantus use, though patient still having occasional spikes after  meals    - will decrease lantus from 42 units to 35 units QAM and start patient on  standing humalog 3 units TID before meals.    - advised patient to avoid injecting in the same area each time to avoid  areas of edema (can also inject underneath arms or in thighs)    - will place patient on Libre for BS monitoring for 2 weeks.    -  Follow up in 4 weeks with labs at that time.    ---    2\. Diabetes associated problems    - HTN: continue home nifedipine    - HLD: currently holding statin given pregnancy    - Eyes: patient with some vision blurriness after staring at screen for a  long time(?dry eye/fatigue vs diabetic retinopathy), encouraged patient to go  back to ophtho for proper eye exam    - Kidneys: normal Cr 0.8 in April/2018    - Feet: normal foot exam with no loss of sensation/numbness tingling    - CAD: asymptomatic currently, denies chest pain. Stress ECHO on 02/13/17  negative for signs of ischemia    ---    3\. obesity/nutrition    - patient with good diet currently and exercises. Encouraged to walk for  3-4 times a week instead. Advised patient to maintain current weight  during pregnancy, but can try to lose weight again after delivery.    ---    I personally interviewed and examined the patient and reviewed resident's note  and added my changes above. I agree with the history, exam, assessment and  plan as detailed in the resident's note.    ---      Treatment    ---       **1\. Pre-existing type 2 diabetes mellitus during pregnancy in second  trimester**    Refill Lantus Solution, 100 UNIT/ML, 35 units (max of 45), Subcutaneous, Once  a day, 90 days, 90, Refills 4    Start Accu-Chek Aviva-Blood Glucose Test Strips, 1, as directed, SQ, 8 times  per day, 90 days, 900, Refills 3    Start Accu-Chek Softclix Lancets Miscellaneous, 1, as directed, SQ, 6 times  per day, 90 days, 900, Refills 3    Start Humalog KwikPen Solution Pen-injector, 100 UNIT/ML, 3 units as directed  (max 15 units daily), Subcutaneous, 4 times a day, 90 days, Refills 4    Start BD Pen Needle Short U/F Miscellaneous, 31G X 8 MM, as directed, SQ, 4  times a day, 90 days, 4 boxes, Refills 3    _LAB: Hemoglobin A1c (Glycohemoglobin) (Ordered for 05/17/2017)_    _LAB: Fructosamine (Glycosylated Albumin) (Ordered for 05/17/2017)_    _LAB: Basic Metabolic Profile  (BMP) (Ordered for 05/17/2017)_    _LAB: Liver Function Tests (Ordered for 05/17/2017)_    _LAB: Albumin, Random  Urine (Ordered for 05/17/2017)_    _LAB: Vitamin D, 25 hydroxy (Ordered for 05/17/2017)_    Notes: 1. Check blood sugars upon waking before meals 1-2 after meals. Also  check before bed this should equate to around 8 times daily.    2\. Get lab work before next visit    3\. Reduce Lantus to 35 units    4\. Start Humalog 3 units before meals.    5\. Pick up libre sensors and reader from pharmacy once approved.    6\. Come in in 2 weeks to have [employer].    ---      Procedure Codes    ---      [phone] GLUCOSE MONITORING, CONT    ---      Follow Up    ---    schedule every 4 weeks x 2 (40 minute appt if possible), labs before next  visit, sign up for the portal    Electronically signed by Heidi Payor MD on 04/30/2017 at 01:42 PM EDT    Sign off status: Completed        * * *        Endocrinology    [address]    [place], [place] [address]    Tel: [phone]    Fax: [phone]              * * *          Patient: [name], [name] DOB: July 31, 1976 Progress Note: Heidi Payor, MD  04/26/2017    ---    Note generated by eClinicalWorks EMR/PM Software ([email or web address])

## 2021-02-02 NOTE — Progress Notes (Signed)
* * *        [name]**    --- ---    67 Y old Female, DOB: 06-12-1976, External MRN: [id number]    Account Number: [id number]    [address], [place], [id number]    Home: [phone]    Insurance: HMO [place] OUT IPA Payer ID: PAPER    PCP: [name] Referring: [name] External Visit ID: [id number]    Appointment Facility: [facility]        * * *    10/22/2017   **Appointment Provider:** Spero Curb **CHN#:** [id number]    --- ---      **Supervising Provider:** Jimmy Picket, MD    ---         **Current Medications**    ---    Taking     * Accu-Chek Aviva-Blood Glucose Test Strips 1 as directed SQ 8 times per day    ---    * Accu-Chek Softclix Lancets 1 Miscellaneous as directed SQ 6 times per day    ---    * BD Pen Needle Short U/F 31G X 8 MM Miscellaneous as directed SQ 4 times a day    ---    * Colace 100 MG Capsule 1 capsule as needed Orally Once a day    ---    * Humalog KwikPen 100 UNIT/ML Solution Pen-injector 3 units as directed (max 15 units daily) Subcutaneous with meals    ---    * Lantus 100 UNIT/ML Solution 18units (max of 45) Subcutaneous Once a day    ---    * NIFEdipine ER 60 MG Tablet Extended Release 24 Hour 1 tablet on an empty stomach Orally Once a day    ---    * Prenatal     ---    Not-Taking/PRN    * Aspirin Low Dose 81 MG Tablet Chewable 1 tablet Orally Once a day    ---    * Iron (Ferrous Gluconate) 256 (28 Fe) MG Tablet 1 tablet Orally Once a day    ---    * Valtrex 500 mg Tablet 1 tablet Orally twice a day    ---    * Medication List reviewed and reconciled with the patient    ---      Past Medical History    ---       Diabetes .        ---    Chronic hypertension.        ---    Seizures as a child.        ---       **Surgical History**    ---       lap cholecystectomy 2010    ---    cesarean delivery 2018    ---       **Family History**    ---       Mother: alive 54 yrs, diagnosed with Diabetes, Hypertension    ---    Father:  deceased, Heart Disease in 20    ---    Maternal Grand Mother: alive, Diabetes, Hypertension    ---    Parnter is 37 and is [place]. No fam hx of congen anomalies, MR, stillbirth,  sickle cell disease.    Father- H/o CHF    Mother - h/o diabetes and HTN.    ---       **Social History**    ---    Tobacco history:  Never smoked.    Marital Status  Single.    Work/Occupation: employed .    Alcohol  not currently.   10/22: plans to stop work tomorrow, take some time  for herself before the baby comes. things ready at home.    11/12: feeling tired. good support at home. baby doing well - came home from  the nicu last week. no DV. not feeling depressed. EDS negative today.    ---       **Allergies**    ---       Penicillin: rash    ---    [Allergies Verified]       **Hospitalization/Major Diagnostic Procedure**    ---       No Hospitalization History.    ---       **Review of Systems**    ---     _ORT_ :    Eyes No. Ear, Nose Throat No. Digestion, Stomach, Bowel No. Bladder Problems  No. Bleeding Problems No. Numbness/Tingling Yes. Anxiety/Depression No.  Fever/Chills/Fatigue No. Chest Pain/Tightness/Palpitations No. Skin Rash No.  Dental Problems No. Joint/Muscle Pain/Cramps No. Blackout/Fainting No. Other  No.            **Reason for Appointment**    ---       1\. Follow-up bilateral carpal tunnel syndrome.    ---       **History of Present Illness**    ---     _GENERAL_ :    [name] is a very pleasant 45 year old female who comes to clinic for  follow-up of her bilateral carpal tunnel syndrome. She was last seen in clinic  2 months ago, at which point she received a right carpal tunnel injection and  she says that her symptoms are better; however, she still does have some  numbness in the right hand. She gave birth to a baby girl 6 weeks ago, her  symptoms have not gotten any better. On the left side, she has a dense  numbness and tingling in the left thumb, index, and middle fingers. She is  interested in receiving  a left cortisone injection today.       **Vital Signs**    ---    Pain scale 8, Ht-in 66, Ht-cm 167.64.       **Physical Examination**    ---    The patient is in no acute distress and comfortable. She is breathing  comfortably. Focused examination of the left hand and wrist shows that the  skin is intact. There is no erythema or ecchymosis. No gross muscle atrophy or  deformities. She has decreased sensation in the thumb, index, and middle  fingers. She has sensation intact to light touch in the radial and ulnar nerve  distributions. She has a negative Durkan's and Tinel's test at the wrist. She  is able to make a full fist and gain full extension of all digits. She has  AIN, PIN and ulnar motor nerves intact. Her hand and fingers are warm and well  perfused.       **Assessments**    ---    1\. Bilateral carpal tunnel syndrome - G56.03 (Primary)    ---       **Treatment**    ---       **1\. Others**    Notes: [name]' clinical findings were discussed with her in detail in  clinic today. She received a left carpal tunnel injection as detailed in the  procedure section. She was advised if her  symptoms do not improve over time,  she may need surgery in the future. She will return to clinic in 2 months'  time for repeat evaluation at that time. She will call with any further  questions or concerns.    ---      **Procedures**    ---    Left carpal tunnel injection: Informed verbal consent was obtained. Laterality  was confirmed. The left volar wrist was prepped with isopropyl alcohol and the  carpal tunnel was injected with 1 mL of 1% plain lidocaine and 1 mL of  Celestone. A dry sterile bandage was placed. The patient tolerated the  procedure well.       **Procedure Codes**    ---       I6962 Celestone    ---    20526 Injection carpal tunnel    ---       **Follow Up**    ---    2 Months    **Appointment Provider:** Spero Curb    Electronically signed by Jimmy Picket , M.D. on 12/04/2017 at 09:35 AM EST     Sign off status: Completed        * * *        [facility]    [address]    [place], [address]    [place], [place] [address]    Tel: [phone]    Fax: [phone]              * * *          Patient: [name], [name] DOB: [birth year] Progress Note: Spero Curb  10/22/2017    ---    Note generated by eClinicalWorks EMR/PM Software ([email or web address])

## 2021-02-02 NOTE — Progress Notes (Signed)
**Progress Notes**    ---    **Patient:** [name], ROMAAccount Number:** [id number] **External MRN:** [id number]   **Provider:** Elnora Morrison, MD     **DOB:** 11-Jul-1976 **Age:** 45 Y **Sex:** Female   **Date:** 03/20/2017     **Phone:** [phone]   **CHN#:** [id number]     **Address:** [address], [place], [place]     **Pcp:** [name]        * * *         **Subjective:**        ---       **Chief Complaints:**    --- ---       1\. AMA,HYPERTENSION AND DIABETES. 45 yo G2P0010 w/ LMP 12/05/16, EDC  09/11/17 EGA [redacted] weeks for NOB visit due to [facility], DM, AMA.    --- ---      **HPI:**    _GENERAL_ :    Hx of CHTN since age late 21s. Was on Lisinopril until early April of 2018  when switched due pregnancy. Primary Care MD Eual Fines in [facility].    No hx of cardiac or renal complications. Fam hx of HTN (mom).    Diagnosed with diabetes in early 20s, and was first treated with oral agents.  Switched to insulin sometime in last few years. Checks BSs a couple of times a  day (now). States she has had issues with her eyes related to DM - has not  required surgery. Sees ophtho tomorrow. Has not seen a nutritionist in this  pregnancy. Reports numbness in hands, but not feet.    HbA1C previously 9. Most recent 8.1%.    --- ---       **ROS:**    _MFM Review of Systems_ :    Vaginal bleeding, leakage of fluid from the vagina, cramping No. Fevers,  weakness, memory loss, swollen glands, easy bruising, weight loss, constant  fatigue No. Visual problems, blurred vision, hearing difficulty No, except hx  of blurred vision - followed ophtho. Changing mole, skin rash, jaundice No.  Chest pains, palpitations, dizziness, heart murmurs No. Shortness of breath,  cough, wheezing No, except SOB when active. Nausea, vomiting, loss of  appetite, constipation, diarrhea No, except mild constipation. Painful  urination, leakage of urine, frequent urination, blood in urine No. Joint  swelling, joint stiffness, pain in legs No.  Breast lumps, breast pain, nipple  discharge No. Headaches, numbness, weakness, seizures No, except mild HA - ?  related to new medications. Depression, anxiety No. Physical or emotional  abuse No. Reviewed with patient Yes.        --- ---      **Medical History:** Diabetes , Chronic hypertension, Seizures as a child,  Pregnancy: yes.        --- ---      **Gyn History:** Hx Abnormal pap smear yes, with biopsy in past but no  treatment. Last pap 2015.. Menstrual History LMP 12/05/2016. STD  HPV/condyloma, HSV/Herpes - outbreaks rare. Pregnancy #1 Date- /2006, Del-  TAB, Comments- No complications.    --- ---      **OB History:** Previous Pregnancies Total Pregnancies: 1, Full Term: 0,  Premature: 0, AB. Induced: 0, AB. Spontaneous: 0, Ectopics: 0, Multiple  Births: 0, Living: 0\.    --- ---      **Surgical History:** lap cholecystectomy 2010.    --- ---      **Hospitalization/Major Diagnostic Procedure:** Denies Past Hospitalization.    --- ---      **  Family History:**    Parnter is 70 and is [place]. No fam hx of congen anomalies, MR, stillbirth,  sickle cell disease.    --- ---       **Social History:** Tobacco history: Formerly smoked quits when found out  pregnant. Marital Status  Single. Work/Occupation: employed full-time doind  Therapist, sports. Alcohol  Denies. Illicit drugs: Used marijuana 3-4 x per day  until pregnant then stopped. Lives with: Alone.    --- ---      **Medications:** Taking Lantus 40u daily, Taking NIFEdipine 60mg  daily,  Taking Prenatal    --- ---      **Allergies:** Penicillin: rash.    --- ---        **Objective:**        ---       **Vitals:** Pain scale 0/10, Ht-in 5'6", Wt-lbs 216, BMI 34.86, BP 144/86,  159/122, BP UE 159/144, BP LE 122/86.    --- ---       **Physical Examination:**    _OB/Gyn General_ :    General Appearance: well-appearing, no acute distress. Mental Status: alert  and oriented. Mood/Affect: pleasant.    _OB/Gyn HEENT_ :    Head ATNC. Eyes PERRLA.    _OB/Gyn Neck_ :     Thyroid: unremarkable. Neck Mass: none.    _OB/Gyn Chest/Thorax_ :    Breath sounds: clear to auscultation. Respiratory Effort normal.    _OB/Gyn Heart_ :    Rhythm: regular. Murmurs: none. Rate: normal.    _OB/Gyn Abdomen_ :    General: soft. Tenderness: nontender. Fundal Height not palpable. Fetal Heart  Tones present.    _OB/Gyn Skin_ :    General: warm, moist, no rash.    _OB/Gyn Extremities_ :    Edema: no clubbing, cyanosis, edema.        --- ---        **Assessment:**        ---       **Assessment:**    1\. Elderly multigravida in second trimester - O09.522 (Primary)    2\. Medication exposure during first trimester of pregnancy - O09.891    3\. Maternal chronic hypertension in second trimester - O10.912    4\. Diabetes mellitus affecting pregnancy in second trimester - O24.912    5\. Herpesviral infection, unspecified - B00.9    6\. Other viral diseases complicating pregnancy, unspecified trimester -  O98.519    --- ---        **Plan:**        ---         **1\. Elderly multigravida in second trimester**    Notes: Discussed age related risk of Down syndrome and all chromosomal  abnormalities. Discussed options for screening and diagnostic testing. Pt  opted to have NIPT today.    Discussed risks of pregnancy after age 40, including the increased risk of CD,  stillbirth and DM/HTN disorders. ,Advanced Maternal Age: Care Instructions  material was printed.    ---    **2\. Medication exposure during first trimester of pregnancy**    Notes: Patient was on lisinopril and statin in early pregnancy. Now on  nifedipine. Targeted anatomic survey at 18-19 weeks planned.    **3\. Maternal chronic hypertension in second trimester**    Start Aspirin Low Dose Tablet Chewable, 81 MG, 1 tablet, Orally, Once a day,  30 day(s), 30, Refills 5 .    Notes: Discussed importance of BP control (forgot to take medicine this a.m.),  and risks of CHTN, including PEC, IUGR,  abruption, early (indicated) delivery,  and stillbirth.  Recommended low dose ASA to reduce the risk of severe or early  onset PEC.    **4\. Diabetes mellitus affecting pregnancy in second trimester**    Notes: Discussed need for tight control and target goals for BSs. Instructed  on fasting and postprandial blood sugars and Flow sheets given. Recommended  nutrition consult. Reviewed maternal and fetal risks of DM in pregnancy, and  risk of fetal anomaly with HbA1c of 8%. No adjustments to insulin today.    Recommended 'early' anatomy next week then targeted scan at 18-19 and fetal  echo at 20 weeks.    **5\. Herpesviral infection, unspecified**    Notes: Pt very concerned re: hx of HSV. Explained we recommend prophylaxis few  weeks before delivery to decrease the change CD would be needed for HSV  outbreak.    **6\. Others**    Continue NIFEdipine, 60mg , daily ; Continue Prenatal ; Continue Lantus, 40u,  daily .    Notes: Discussed practice setup and contact info. ACOG info on DM, CHTN, and  AMA given.      **Follow Up:** 1 Week    --- ---    ---    ---          **Provider:** Elnora Morrison, MD    ---     **Patient:** [name], [name] **DOB:** 09-30-1976 **Date:** 03/20/2017    ---    Electronically signed by Marlowe Kays , MD on 03/20/2017 at 06:25 PM EDT    Sign off status: Completed

## 2021-02-02 NOTE — Progress Notes (Signed)
* * *        **  [name]**    --- ---    80 Y old Female, DOB: 31-Dec-1975    [address], [place], [place] [address]    Home: [phone]    Provider: [name], MD        * * *    Web Encounter    ---    Answered by   ENDOCRINE, ADMIN  Date: 05/02/2017         Time: 04:41 PM    Caller   Riannah Askin    --- ---            Reason   Rx for Humalog            Message                       Addressed ToPatrcia Dolly, Aviv Rota  <br/> Hi Dr. [name],      I'm still waiting on the Rx for Humalog from my visit on 6/21. [facility] [address] [facility]. and they don't have anything on file/any new Rx. A pharmacist was going to reach out to you but I don't believe that has happened. The Rx needs to be a 90 day supply for insurance to cover.      The address is :       [facility]      [address] [place], [place] [address]      [phone]      Thank you! [name], MRN [id number]                Action Taken   [name] , MD 05/03/2017 12:56:16 PM > , Prescription  was faxed/electronically transferred to pharmacy., Provided feedback to the  patient via portal messaging system.                * * *                ---          * * *          PatientKARELI, Heidi DOB: 1975-11-10 Provider: [name], MD  05/02/2017    ---    Note generated by eClinicalWorks EMR/PM Software ([email or web address])

## 2021-02-02 NOTE — Progress Notes (Signed)
* * *        [name]**    --- ---    59 Y old Female, DOB: August 26, 1976    [address], [place], [place] [address]    Home: [phone]    Provider: [name], MD        * * *    Telephone Encounter    ---    Answered by   [name]  Date: 04/26/2017         Time: 10:54 AM    Action Taken   [name] , MD 04/26/2017 10:54:56 AM > Heidi, this  patient has gestational diabetes and I would like to get her a libre sensor  system. Can you check where to send it if approved? [name],Heidi M 05/03/2017  6:16:03 PM > I ran a test claim for #3 for 30 day supplt and it does not  require a co pay, it is a $50 co pay. You can send it to [facility], or  if the pt prefers a different pharmacy... but it is covered under pharmacy  benefit. Marcelyn Ruppe , MD 05/04/2017 10:09:33 AM > Provided feedback to  the patient via portal messaging system. sent [facility]).    --- ---            Refills  Start FreeStyle [facility] System Miscellaneous, 1, SQ, 9, as  directed, 1 per 10 days, 90 days, Refills=4    --- ---      [facility] Reader Device, 1, SQ, 1 kit, as directed, continuous,  90 days, Refills=3          * * *         **eMessages**   From:   Caileen Veracruz    --- ---    Created:   2017-05-04 10:11:21    Sent:   2017-05-04 10:11:24    Subject:   RE:    MessageShanon Payor , MD 04/26/2017 10:54:56 AM > Heidi, this patient has gestational diabetes and I would like to get her a libre sensor system. Can you check where to send it if approved?      [name],Heidi M 05/03/2017 6:16:03 PM > I ran a test claim for #3 for 30 day supplt and it does not require a co pay, it is a $50 co pay.  You can send it to [facility], or if the pt prefers a different pharmacy... but it is covered under pharmacy benefit.      [name] , MD 05/04/2017 10:09:33 AM >    [name],    The libre sensor system is covered although will cost $50  monthly according to  our staff research. I will send it to your pharmacy and you can decide whether  to pick it up or not (at least wear it for the pregnancy). You can come into  the clinic without an appt to be shown how to put it on and use it.    -Dr. Seth Bake.                      * * *        ---           **Medical History:**   Diabetes .    ---    Chronic hypertension.    ---  Seizures as a child.    ---      Allergies    ---      Penicillin: rash    ---      Assessments    ---    1\. Pre-existing type 2 diabetes mellitus during pregnancy in second trimester  - O24.112    ---      Treatment    ---       **1\. Pre-existing type 2 diabetes mellitus during pregnancy in second  trimester**    Start FreeStyle [employer] Miscellaneous, 1, as directed, SQ, 1 per  10 days, 90 days, 9, Refills 4    Start FreeStyle Libre Reader Device, 1, as directed, SQ, continuous, 90 days,  1 kit, Refills 3    ---          * * *          PatientSHATERA, [name] DOB: April 19, 1976 Provider: [name], MD  04/26/2017    ---    Note generated by eClinicalWorks EMR/PM Software ([email or web address])

## 2021-02-02 NOTE — Progress Notes (Signed)
* * *        [name]**    --- ---    41 Y old Female, DOB: Jan 13, 1976, External MRN: [id number]    Account Number: [id number]    [address], [place], [place]    Home: [phone]    Insurance: HMO [place] [employer] IPA Payer ID: PAPER    PCP: [name] Referring: [name] External Visit ID: [id number]    Appointment Facility: [facility]        * * *    09/17/2017  Progress Notes: Vance Peper, MD **CHN#:** [phone]    --- ---    ---        Current Medications    ---    Taking     * Accu-Chek Aviva-Blood Glucose Test Strips 1 as directed SQ 8 times per day    ---    * Accu-Chek Softclix Lancets 1 Miscellaneous as directed SQ 6 times per day    ---    * BD Pen Needle Short U/F 31G X 8 MM Miscellaneous as directed SQ 4 times a day    ---    * Colace 100 MG Capsule 1 capsule as needed Orally Once a day    ---    * Humalog KwikPen 100 UNIT/ML Solution Pen-injector 3 units as directed (max 15 units daily) Subcutaneous with meals    ---    * Lantus 100 UNIT/ML Solution 18units (max of 45) Subcutaneous Once a day    ---    * NIFEdipine ER 60 MG Tablet Extended Release 24 Hour 1 tablet on an empty stomach Orally Once a day    ---    * Prenatal     ---    Not-Taking/PRN    * Aspirin Low Dose 81 MG Tablet Chewable 1 tablet Orally Once a day    ---    * Iron (Ferrous Gluconate) 256 (28 Fe) MG Tablet 1 tablet Orally Once a day    ---    * Valtrex 500 mg Tablet 1 tablet Orally twice a day    ---      Past Medical History    ---       Diabetes .        ---    Chronic hypertension.        ---    Seizures as a child.        ---      Surgical History    ---      lap cholecystectomy 2010    ---    cesarean delivery 2018    ---      Social History    ---    Tobacco history: Never smoked.    Marital Status  Single.    Work/Occupation: employed .    Alcohol  not currently.   10/22: plans to stop work tomorrow, take some time  for herself before the baby comes. things ready at home.    11/12: feeling  tired. good support at home. baby doing well - came home from  the nicu last week. no DV. not feeling depressed. EDS negative today.    ---      Gyn History    ---    Hx Abnormal pap smear yes, with biopsy in past but no treatment. Last pap  2015..    Menstrual History LMP 12/05/2016.    STD HPV/condyloma, HSV/Herpes - outbreaks rare.    Pregnancy #  1 Date- /2006, Del- TAB, Comments- No complications.      OB History    ---    Previous Pregnancies Total Pregnancies: 2, Full Term: 0, Premature: 0, AB.  Induced: 1, AB. Spontaneous: 0, Ectopics: 0, Multiple Births: 0, Living: 0\.      Allergies    ---      Penicillin: rash    ---        History of Present Illness    ---     _GENERAL_ :    here today for pp wound check.      Vital Signs    ---    Pain scale 0/10, Ht-in 66, Wt-lbs 234, BMI 37.76, BP 148/80, 136/79.      Physical Examination    ---     _GENERAL_ :    General Appearance: Looks Healthy, no acute distress, pleasant, obese. Abdomen  soft, nontender, nondistended, obese, incision CDI. mild induration suprior to  incision 2cm on left and 2cm on right side. nontender. no erythema. no  drainage..          Assessments    ---    1\. Encounter for postpartum visit - Z39.2 (Primary)    ---    2\. Chronic hypertension affecting pregnancy - O10.919    ---    3\. Obesity affecting pregnancy, antepartum - O99.210    ---    4\. Pre-existing type 2 diabetes mellitus during pregnancy, antepartum -  O24.119    ---      Treatment    ---       **1\. Encounter for postpartum visit**    Notes: here today for wound check - looks great. reassurance given. still  taking nifedipine, but forgot dose today (taking same dose at the beginning of  pregnancy). taking lantus 18u. has follow up scheduled with her  endocrinologist in a few weeks. will take some fingersticks prior to her  appointment so there is a log for review. she has been taking fingersticks  intermittently now, and they have remained in good control she says. It's been  a  long couple of weeks - baby had to be in the NICU, which was unexpected. She  is home now, and doing well. Patient is breast and bottle feeding. Tired, but  denies symptoms of depression. Good support at home. We will follow up in  about 4 weeks.    ---      Procedure Codes    ---      771AB POSTPARTUM N/C MFM    ---      Follow Up    ---    4 Weeks    Electronically signed by Jerene Dilling Brieana Shimmin MD on 09/19/2017 at 05:54 PM EST    Sign off status: Completed        * * *        [facility]    [address]    [place], [place] [address]    Tel: [phone]    Fax: [phone]              * * *          Patient: [name] DOB: Mar 20, 1976 Progress Note: Abdoulaye Drum, MD  09/17/2017    ---    Note generated by eClinicalWorks EMR/PM Software ([email or web address])

## 2021-02-02 NOTE — Progress Notes (Signed)
* * *        [name]**    --- ---    4 Y old Female, DOB: 26-Jul-1976, External MRN: [id number]    Account Number: [id number]    [address], [place], [place]-[address]    Home: [phone]    Insurance: HMO [employer] Payer ID: PAPER    PCP: [name] Referring: [name] External Visit ID: [id number]    Appointment Facility: [facility]        * * *    11/12/2017  Progress Notes: Vance Peper, MD **CHN#:** [id number]    --- ---    ---        Current Medications    ---    Taking     * Accu-Chek Aviva-Blood Glucose Test Strips 1 as directed SQ 8 times per day    ---    * Accu-Chek Softclix Lancets 1 Miscellaneous as directed SQ 6 times per day    ---    * BD Pen Needle Short U/F 31G X 8 MM Miscellaneous as directed SQ 4 times a day    ---    * Humalog KwikPen 100 UNIT/ML Solution Pen-injector 3 units as directed (max 15 units daily) Subcutaneous with meals    ---    * Hydrochlorothiazide 25 mg Tablet 1 tablet in the morning Orally Once a day    ---    * Lantus 100 UNIT/ML Solution 42units (max of 45) Subcutaneous Once a day, Notes: increased to 48u today    ---    Not-Taking/PRN    * Aspirin Low Dose 81 MG Tablet Chewable 1 tablet Orally Once a day    ---    * Colace 100 MG Capsule 1 capsule as needed Orally Once a day    ---    * Iron (Ferrous Gluconate) 256 (28 Fe) MG Tablet 1 tablet Orally Once a day    ---    * Prenatal     ---    * Valtrex 500 mg Tablet 1 tablet Orally twice a day    ---    * Medication List reviewed and reconciled with the patient    ---      Past Medical History    ---       Diabetes .        ---    Chronic hypertension.        ---    Seizures as a child.        ---    Breast Feeding: yes.        ---      Surgical History    ---      lap cholecystectomy 2010    ---    cesarean delivery 2018    ---      Social History    ---    Tobacco history: Never smoked.    Marital Status  Single.    Work/Occupation: employed .    Alcohol  not currently.   10/22: plans to  stop work tomorrow, take some time  for herself before the baby comes. things ready at home.    11/12: feeling tired. good support at home. baby doing well - came home from  the [facility]u last week. no DV. not feeling depressed. EDS negative today.    ---      Gyn History    ---    Hx Abnormal pap smear yes, with biopsy  in past but no treatment. Last pap  2015..    Menstrual History LMP 12/05/2016.    STD HPV/condyloma, HSV/Herpes - outbreaks rare.    Pregnancy #1 Date- /2006, Del- TAB, Comments- No complications.      OB History    ---    Previous Pregnancies Total Pregnancies: 2, Full Term: 0, Premature: 0, AB.  Induced: 1, AB. Spontaneous: 0, Ectopics: 0, Multiple Births: 0, Living: 0\.      Allergies    ---      Penicillin: rash    ---    [Allergies Verified]      Hospitalization/Major Diagnostic Procedure    ---      childbirth 2018    ---      Review of Systems    ---    has stuffy nose and congestion - she thinks it's the start of a cold. no  fever/chills. no sob or chest pain or palpitations. eating well. no abdominal  pain. has vaginal bleeding now - she thinks it's her period. no more leg  swelling, no LE pain.        History of Present Illness    ---     _GENERAL_ :    here for postpartum BP follow up and postpartum physical exam with pap and  mirena placement.      Vital Signs    ---    Pain scale 0/10, Ht-in 66, Wt-lbs 241, BMI 38.89, BP 130/80.      Physical Examination    ---     _GENERAL_ :    General Appearance: Looks Healthy, well-nourished, well-groomed, no acute  distress, fatigued, pleasant. Mood/Affect: pleasant, happy, appropriate. Neck  no lymphadenopathy, no thyromegaly. Cardiovascular heart regular rate and  rhythm, extremities warm without swelling. Lungs Clear to auscultation  bilaterally. Abdomen Soft, nontender, nondistended, obese. Extremities No  edema. Female pelvic exam patient declined.          Assessments    ---    1\. Chronic hypertension affecting pregnancy - O10.919 (Primary)     ---    2\. Obesity affecting pregnancy, antepartum - O99.210    ---    3\. Pre-existing type 2 diabetes mellitus during pregnancy, antepartum -  O24.119    ---    4\. Encounter for other general counseling or advice on contraception - Z30.09    ---    5\. Encounter for postpartum visit - Z39.2    ---      Treatment    ---       **1\. Chronic hypertension affecting pregnancy**    Notes: started on HCTZ last visit. BP 130/80 today. feeling well. LE edema  resolved. labs last week wnl (aside from elevated blood glucose). continue  current dose of HCTZ. close follow up with primary care scheduled today.    ---        **2\. Obesity affecting pregnancy, antepartum**    Notes: tired postpartum as expected. admits not eating very well. empathy  given. pt understands importance of weight loss for better health.        **3\. Pre-existing type 2 diabetes mellitus during pregnancy, antepartum**    Notes: blood glucose 300 on recent labs - did not take insulin that day. did  not bring log today, but reports fasting today 125, pp sugars over last week  175, 190s, etc. random fingerstick today = 192. discussed importance of better  diabetes control. lantus 42 increased to Lantus 48 today. encouraged adherence  to  Lantus. for follow up with primary care.        **4\. Encounter for other general counseling or advice on contraception**    Notes: does not want pelvic exam today. discussed importance of cervical  cancer screening and discussed birth control today. does not want mirena  placement today, but still thinks this would be the best birth control for  her. offered interim method with progesterone only birth control pill.  declines for now. will consider pelvic exam with pap and mirena placement at  the [facility] since it is closer to her home and she doesn't have to  pay for parking. offered follow up here with me if she would like - will  schedule appointment here in 4 weeks in case she decides she wants to  come  here, or has trouble getting appt closer to home. always glad to see Cobi  here.        **5\. Encounter for postpartum visit**    Notes: discussed importance of primary care follow up in the setting of HTN,  DM, and obesity. will make primary care appt asap. asked our front desk to  schedule this today so she has one on the books.      Follow Up    ---    4 Weeks    Electronically signed by Jerene Dilling Asencion Guisinger MD on 11/12/2017 at 12:28 PM EST    Sign off status: Completed        * * *        Maternal Fetal Medicine    [address]    [place], [place] [address]    Tel: [phone]    Fax: [phone]              * * *          Patient: [name], [name] DOB: Jul 19, 1976 Progress Note: Heidi Ramseyer, MD  11/12/2017    ---    Note generated by eClinicalWorks EMR/PM Software ([email or web address])

## 2021-02-02 NOTE — Progress Notes (Signed)
* * *        [name]**    --- ---    38 Y old Female, DOB: 06-18-1976, External MRN: [id number]    Account Number: [id number]    [address], [place], [id number]    Home: [phone]    Insurance: HMO [place] [employer]    PCP: [name] Referring: [name]    Appointment Facility: Pediatric Cardiology        * * *    04/27/2017  Progress Notes: Karilyn Cota, M.D. **CHN#:** [id number]    --- ---    ---        Current Medications    ---    Taking     * Accu-Chek Aviva-Blood Glucose Test Strips 1 as directed SQ 8 times per day    ---    * Accu-Chek Softclix Lancets 1 Miscellaneous as directed SQ 6 times per day    ---    * Aspirin Low Dose 81 MG Tablet Chewable 1 tablet Orally Once a day    ---    * BD Pen Needle Short U/F 31G X 8 MM Miscellaneous as directed SQ 4 times a day    ---    * Colace 100 MG Capsule 1 capsule as needed Orally Once a day    ---    * Humalog KwikPen 100 UNIT/ML Solution Pen-injector 3 units as directed (max 15 units daily) Subcutaneous with meals    ---    * Lantus 100 UNIT/ML Solution 35 units (max of 45) Subcutaneous Once a day    ---    * NIFEdipine ER 60 MG Tablet Extended Release 24 Hour 1 tablet on an empty stomach Orally Once a day    ---    * Prenatal     ---    * Medication List reviewed and reconciled with the patient    ---      Past Medical History    ---       Diabetes .        ---    Chronic hypertension.        ---    Seizures as a child.        ---      Social History    ---    Tobacco history: Never smoked.      Allergies    ---      Penicillin: rash    ---        Reason for Appointment    ---      1\. Pregnancy diabetes    ---      Assessments    ---    1\. Insulin controlled gestational diabetes mellitus (GDM) during pregnancy,  antepartum - O24.414 (Primary)    ---      Normal fetal cardiac structures, function and rhythm.    Fetal echo cannot rule out a small VSD, ASD, or discrete coarctation of the  aorta.    This test results, subsequent  cardiology surveillance plan and potential  effects on the baby and future pregnancies have been explained to the patient  and her family members.    Thank you very much for your kind referral.    ---      Treatment    ---       **1\. Others**    Notes: I spent 45 minutes total in this patient encounter, where > 50% was  consulting time and  coordination of care.    ---        Diagnostic Imaging    --- ---    _Imaging: Echo, Fetal - Full_    ---       Follow Up    ---    prn      Vital Signs    ---    Pain scale 0, Ht-in 66, Wt-lbs 218.26, BMI 35.22, BP 112/75, HR 82, Ht-cm  167.64, Wt-kg 99.      Procedure Codes    ---      [phone] ECHO EXAM OF FETAL HEART, Modifiers: 26    ---    93325 DOPPLER COLOR FLOW ADD-ON, Modifiers: 59    ---    11914 ECHO EXAM OF FETAL HEART, Modifiers: 26    ---    Electronically signed by Karilyn Cota , M.D. on 04/27/2017 at 11:17 AM EDT    Sign off status: Completed        * * *        Pediatric Cardiology    [address]    [place], [place] [address]    Tel: [phone]    Fax: [phone]              * * *          Patient: [name], [name] DOB: [birth year] Progress Note: Karilyn Cota, M.D.  04/27/2017    ---    Note generated by eClinicalWorks EMR/PM Software ([email or web address])

## 2021-02-02 NOTE — Progress Notes (Signed)
* * *        **  [name]**    --- ---    67 Y old Female, DOB: [birth year]    [address], [place], [place] [address]    Home: [phone]    Provider: Elnora Morrison        * * *    Telephone Encounter    ---    Answered by   Debbora Dus  Date: 07/03/2017         Time: 01:05 PM    Caller   [name]    --- ---            Reason   dental work            Message                      [facility] called c/o mouth senstitivites and possible filling replacement. Pt requested a letter for treatment be sent to her dentist.  This rn spoke with Dr Murrell Redden, letter written and faxed to Dr Tenny Craw at [facility]. Called patient and lvm that letter has been successfully faxed.                Action Taken   Lane,Beth 07/03/2017 1:07:15 PM >                * * *                ---          * * *          Patient: [name], [name] DOB: [birth year] Provider: Marlowe Kays D  07/03/2017    ---    Note generated by eClinicalWorks EMR/PM Software ([email or web address])

## 2021-02-02 NOTE — Progress Notes (Signed)
**Progress Notes**    ---    **Patient:** [name], SPITLERAccount Number:** [id number] **External MRN:** [id number]   **Provider:** Elnora Morrison, MD     **DOB:** [birth year] **Age:** 40 Y **Sex:** Female   **Date:** 05/22/2017     **Phone:** [phone]   **CHN#:** [id number]     **Address:** [address], [place], [place]     **Pcp:** [name]        * * *         **Subjective:**        ---       **Chief Complaints:**    --- ---         --- ---      **Medical History:** Diabetes , Chronic hypertension, Seizures as a child.        --- ---      **Gyn History:** Hx Abnormal pap smear yes, with biopsy in past but no  treatment. Last pap 2015.. Menstrual History LMP 12/05/2016. STD  HPV/condyloma, HSV/Herpes - outbreaks rare. Pregnancy #1 Date- /2006, Del-  TAB, Comments- No complications.    --- ---      **OB History:** Previous Pregnancies Total Pregnancies: 2, Full Term: 0,  Premature: 0, AB. Induced: 1, AB. Spontaneous: 0, Ectopics: 0, Multiple  Births: 0, Living: 0\.    --- ---      **Medications:** Taking Accu-Chek Aviva-Blood Glucose Test Strips 1 as  directed SQ 8 times per day, Taking Accu-Chek Softclix Lancets 1 Miscellaneous  as directed SQ 6 times per day, Taking Aspirin Low Dose 81 MG Tablet Chewable  1 tablet Orally Once a day, Taking BD Pen Needle Short U/F 31G X 8 MM  Miscellaneous as directed SQ 4 times a day, Taking Colace 100 MG Capsule 1  capsule as needed Orally Once a day, Taking Humalog KwikPen 100 UNIT/ML  Solution Pen-injector 3 units as directed (max 15 units daily) Subcutaneous  with meals, Taking Lantus 100 UNIT/ML Solution 35 units (max of 45)  Subcutaneous Once a day, Taking NIFEdipine ER 60 MG Tablet Extended Release 24  Hour 1 tablet on an empty stomach Orally Once a day, Taking Prenatal    --- ---      **Allergies:** Penicillin: rash.    --- ---        **Objective:**        ---       **Vitals:** Pain scale 0/10, Wt-lbs 225.5, BP UE 127, BP LE 67.    --- ---          **Assessment:**        ---       **Assessment:**    1\. Insulin controlled gestational diabetes mellitus (GDM) during pregnancy,  antepartum - O24.414 (Primary)    2\. Obesity (BMI 30.0-34.9) - E66.9    3\. Maternal chronic hypertension in second trimester - O10.912    4\. Elderly multigravida in second trimester - O09.522    5\. Pre-existing type 2 diabetes mellitus during pregnancy in second trimester  - O24.112    --- ---        **Plan:**        ---         **1\. Maternal chronic hypertension in second trimester**    Continue Aspirin Low Dose Tablet Chewable, 81 MG, 1 tablet, Orally, Once a day  .    ---    **2\. Pre-existing type 2 diabetes mellitus during pregnancy in second  trimester**  Continue Accu-Chek Aviva-Blood Glucose Test Strips, 1, as directed, SQ, 8  times per day ; Continue Accu-Chek Softclix Lancets Miscellaneous, 1, as  directed, SQ, 6 times per day ; Continue Lantus Solution, 100 UNIT/ML, 35  units (max of 45), Subcutaneous, Once a day ; Continue Humalog KwikPen  Solution Pen-injector, 100 UNIT/ML, 3 units as directed (max 15 units daily),  Subcutaneous, with meals ; Continue BD Pen Needle Short U/F Miscellaneous, 31G  X 8 MM, as directed, SQ, 4 times a day .    **3\. Others**    Continue Colace Capsule, 100 MG, 1 capsule as needed, Orally, Once a day ;  Continue NIFEdipine ER Tablet Extended Release 24 Hour, 60 MG, 1 tablet on an  empty stomach, Orally, Once a day ; Continue Prenatal .      **Procedure Codes:** [id number] PRENATAL VISIT    --- ---       **Follow Up:** 4 Weeks    --- ---    ---    ---          **Provider:** Elnora Morrison, MD    ---     **Patient:** [name], [name] **DOB:** 02-02-1976 **Date:** 05/22/2017    ---    Electronically signed by Marlowe Kays , MD on 05/22/2017 at 04:52 PM EDT    Sign off status: Completed

## 2021-02-11 ENCOUNTER — Encounter (HOSPITAL_BASED_OUTPATIENT_CLINIC_OR_DEPARTMENT_OTHER)

## 2021-02-17 ENCOUNTER — Encounter (HOSPITAL_BASED_OUTPATIENT_CLINIC_OR_DEPARTMENT_OTHER): Admitting: Internal Medicine

## 2021-02-17 ENCOUNTER — Ambulatory Visit: Attending: Internal Medicine | Admitting: Internal Medicine

## 2021-02-17 ENCOUNTER — Ambulatory Visit (HOSPITAL_BASED_OUTPATIENT_CLINIC_OR_DEPARTMENT_OTHER)

## 2021-02-17 ENCOUNTER — Other Ambulatory Visit

## 2021-02-17 VITALS — BP 121/71 | HR 97 | Temp 98.5°F | Ht 65.0 in | Wt 198.2 lb

## 2021-02-17 DIAGNOSIS — E083213 Diabetes mellitus due to underlying condition with mild nonproliferative diabetic retinopathy with macular edema, bilateral: Secondary | ICD-10-CM

## 2021-02-17 DIAGNOSIS — Z794 Long term (current) use of insulin: Secondary | ICD-10-CM

## 2021-02-17 DIAGNOSIS — E11319 Type 2 diabetes mellitus with unspecified diabetic retinopathy without macular edema: Secondary | ICD-10-CM

## 2021-02-17 LAB — BASIC METABOLIC PANEL
Anion Gap: 10 mmol/L (ref 3–14)
BUN: 17 mg/dL (ref 6–24)
CO2 (Bicarbonate): 26 mmol/L (ref 20–32)
Calcium: 9.3 mg/dL (ref 8.5–10.5)
Chloride: 104 mmol/L (ref 98–110)
Creatinine: 0.98 mg/dL (ref 0.55–1.30)
Glucose: 142 mg/dL — ABNORMAL HIGH (ref 70–139)
Potassium: 3.4 mmol/L — ABNORMAL LOW (ref 3.6–5.2)
Sodium: 140 mmol/L (ref 135–146)
eGFRcr: 73 mL/min/{1.73_m2} (ref >60)

## 2021-02-17 LAB — CBC
Hematocrit: 35.2 % (ref 32.0–47.0)
Hemoglobin: 11.6 g/dL (ref 11.0–16.0)
MCH: 30.4 pg (ref 26.0–34.0)
MCHC: 33 g/dL (ref 31.0–37.0)
MCV: 92.4 fL (ref 80.0–100.0)
MPV: 9.6 fL (ref 9.1–12.4)
NRBC %: 0 % (ref 0.0–0.0)
NRBC Absolute: 0 10*3/uL (ref 0.00–2.00)
Platelets: 323 10*3/uL (ref 150–400)
RBC: 3.81 M/uL (ref 3.70–5.20)
RDW-CV: 16.7 % — ABNORMAL HIGH (ref 11.5–14.5)
RDW-SD: 56.6 fL — ABNORMAL HIGH (ref 35.0–51.0)
WBC: 6.7 10*3/uL (ref 4.0–11.0)

## 2021-02-17 LAB — LIPID PANEL
Cholesterol/HDL Ratio: 3.1
Cholesterol: 153 mg/dL (ref <=200)
HDL cholesterol: 49 mg/dL (ref >=40)
LDL cholesterol, calculated: 85 mg/dL (ref 0–130)
Triglycerides: 93 mg/dL (ref <=150)

## 2021-02-17 LAB — HEMOGLOBIN A1C: HEMOGLOBIN A1C % (INT/EXT): 5.7 % — ABNORMAL HIGH (ref <5.6)

## 2021-02-17 LAB — IRON, TRANSFERRIN, TIBC
Iron Saturation: 13 % (ref 10–50)
Iron: 52 ug/dL (ref 30–180)
TIBC: 388 ug/dL (ref 253–463)
Transferrin: 277 mg/dL (ref 181–331)

## 2021-02-17 LAB — ALBUMIN, URINE, RANDOM (INCLUDING CREATININE)
Albumin, urine (INT/EXT): <1 mg/dL
Creatinine, urine, random (INT/EXT): 97.87 mg/dL (ref 15.00–328.00)

## 2021-02-17 LAB — TSH: TSH: 0.77 u[IU]/mL (ref 0.35–4.94)

## 2021-02-17 LAB — FERRITIN: Ferritin: 42 ng/mL (ref 10.0–307.0)

## 2021-02-17 LAB — ALT: ALT: 22 U/L (ref 0–55)

## 2021-02-17 LAB — POCT HGB A1C: POCT Hemoglobin A1C: 5.6 %

## 2021-02-17 LAB — AST: AST: 21 U/L (ref 6–42)

## 2021-02-17 MED ORDER — FreeStyle Libre sensor system (FreeStyle Libre 14 Day Sensor) kit
11 refills | Status: DC
Start: 2021-02-17 — End: 2021-10-13

## 2021-02-17 NOTE — Assessment & Plan Note (Signed)
 A1c today is 5.8  No reported lows but pt has lost 25 lbs on ozempic  Will check microalbumin  Eye care at [facility] for summer 2022 due/pt will make appt  Decrease lantus to 38 units and get CGM to help monitor

## 2021-02-17 NOTE — Progress Notes (Signed)
 [facility] PRIM [facility] [place] [facility]  [facility]  [address]  [address]  [place] [address]  Dept: [phone]  Dept Fax: [phone]     Patient ID: [name] is a 45 y.o. female who presents for Follow-up.    Subjective         poor sleep and fatigue, Malampati 3/4, obesity with poor concentration, irritability, easy to fall asleep and AM headache, snoring; never got sleep appt/ now improved, will defer for now       DM uncontrolled with retinopathy     HGBA1C = 9 9/21   needs to get back to CGM -sent sensors today    no Lows   hsn't been checking at all sugars   eating not well    has lost a few lbs    eye -goes to [facility], last 2021; thinks due in May/June   increase ozempic to .5 mg, encouraged kindnes to self which I think will get her more on track than beating herself up      LDL = 89     (02/12/2020)     (Normal = 70-129)  on atorvatatin 40 mg not well adherent to this               HTN  , on lisinopril/hctz; has not    advised risk of pregnancy;currenlty not SA)gotten IUD//not currently sexually active.       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 Monitoring with eye care        depression/reactivity/irritability  - restart fluoxetine, declines counseling     Last period/March; longstanding; not on birth control. No recent sex.           MJ use -advised against daily         Likely PCOS (advised weight loss)  Likely hidradenitis-no active boils now; use antibacterial and soaks; consider cleocin gel if continued     pvax in future ; got covid vax x3  pap done at [facility] 3/22, having colposcopy today ---unsure of results, she will get to me       Patient Active Problem List   Diagnosis   ? Unspecified abnormal cytological findings in specimens from cervix uteri   ? 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 Horner's syndrome   ? Fatigue   ? Bilateral carpal tunnel syndrome   ? Closed dislocation of patella   ? High  cholesterol   ? Hypertension   ? Hidradenitis   ? Marijuana smoker   ? Depression, major, in remission (CMS/HCC)     Current Outpatient Medications   Medication Instructions   ? atorvastatin (Lipitor) 40 mg tablet 1 tablet, oral, Daily   ? BD Nano 2nd Gen Pen Needle 32 gauge x 5/32" needle USE AS DIRECTED TO ADMINISTER MEDICATION FROM PEN 4 TIMES DAILY   ? FreeStyle Libre sensor system (FreeStyle Libre 14 Day Sensor) kit Use as instructed   ? HumaLOG KwikPen Insulin 100 unit/mL injection USE 6 UNITS BEFORE EACH MEAL   ? hydroCHLOROthiazide (HYDRODIURIL) 25 mg, oral, Daily   ? Lantus U-100 Insulin 45 Units, subcutaneous, Daily   ? lisinopril 10 mg, oral, Daily   ? Ozempic 0.25 mg or 0.5 mg(2 mg/1.5 mL) pen injector INJECT 0.5MG  UNDER SKIN WEEKLY     Allergies   Allergen Reactions   ? Penicillin      Other reaction(s): rash   ? Penicillin V  Potassium      Other reaction(s): rash as child  No, allergy does not cause a rash.     Family History   Problem Relation Name Age of Onset   ? Diabetes Mother       Social History     Tobacco Use   ? Smoking status: Former Smoker   ? Smokeless tobacco: Not on file   Vaping Use   ? Vaping Use: Never used   Substance Use Topics   ? Alcohol use: Not on file   ? Drug use: Yes     Types: Marijuana     Comment: every other day       Objective   Visit Vitals  BP 121/71 (BP Location: Left arm, Patient Position: Sitting, BP Cuff Size: Large adult)   Pulse 97   Temp 36.9 ?C (98.5 ?F) (Oral)   Ht 1.651 m   Wt 89.9 kg   BMI 32.98 kg/m?   BSA 2.03 m?       Physical Exam    Assessment/Plan   [name] was seen today for follow-up.  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, with long-term current use of insulin (CMS/HCC)  -     FreeStyle Libre sensor system (FreeStyle Libre 14 Day Sensor) kit; Use as instructed  -     POCT glycosylated hemoglobin (Hb A1C)  -     Ferritin; Future  -     Basic metabolic panel;  Future  -     CBC; Future  -     Hemoglobin A1c; Future  -     Lipid panel; Future  -     Albumin, urine, random (Includes Creatinine); Future  -     AST; Future  -     ALT; Future  -     TSH; Future  -     Iron + transferrin + TIBC; Future  Marijuana smoker  Assessment & Plan:  Advised d/c  Depression, major, in remission (CMS/HCC)  Assessment & Plan:  Off meds, in remission  Horner's syndrome  Assessment & Plan:  F/b [facility]  Primary hypertension  Assessment & Plan:  Stable on Lisinopril 10/hctz 25; check bmp; pt understands she is not to get pregnant on ACE (declines further contraception, does n ot want IUD, not currenlty SA)  High cholesterol  Assessment & Plan:  Restart atorvastatin/has not currenlty been adherent  Unspecified abnormal cytological findings in specimens from cervix uteri  Assessment & Plan:  Had pap at [facility] 3/22 abnoraml and has colpo today. 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 Given my card to send me results    spent 45 min with pt and in documentation

## 2021-02-17 NOTE — Assessment & Plan Note (Signed)
 Advised d/c

## 2021-02-17 NOTE — Assessment & Plan Note (Signed)
 Stable on Lisinopril 10/hctz 25; check bmp; pt understands she is not to get pregnant on ACE (declines further contraception, does n ot want IUD, not currenlty SA)

## 2021-02-17 NOTE — Assessment & Plan Note (Signed)
 Restart atorvastatin/has not currenlty been adherent

## 2021-02-17 NOTE — Assessment & Plan Note (Signed)
 Had pap at [facility] 3/22 abnoraml and has colpo today. Given my card to send me results

## 2021-02-17 NOTE — Assessment & Plan Note (Signed)
 Off meds, in remission

## 2021-02-17 NOTE — Assessment & Plan Note (Signed)
 F/b [facility]

## 2021-02-18 ENCOUNTER — Encounter (HOSPITAL_BASED_OUTPATIENT_CLINIC_OR_DEPARTMENT_OTHER): Admitting: Internal Medicine

## 2021-02-18 LAB — POCT HEMOGLOBIN A1C: POCT Hemoglobin A1C: 5.6 % (ref <=5.6)

## 2021-02-18 NOTE — Telephone Encounter (Signed)
 From: [name]  To: [name], MD  Sent: 02/18/2021 11:31 AM EDT  Subject: Insulin    Hi DR.Tishler,  I Did get your message regardingThe insulin  How many units?

## 2021-02-20 ENCOUNTER — Encounter (HOSPITAL_BASED_OUTPATIENT_CLINIC_OR_DEPARTMENT_OTHER): Admitting: Internal Medicine

## 2021-02-23 ENCOUNTER — Telehealth (HOSPITAL_BASED_OUTPATIENT_CLINIC_OR_DEPARTMENT_OTHER): Admitting: Internal Medicine

## 2021-02-23 NOTE — Telephone Encounter (Signed)
Making sure decreasing lantus to 38 units  lmom [name], MD

## 2021-05-03 ENCOUNTER — Telehealth (HOSPITAL_BASED_OUTPATIENT_CLINIC_OR_DEPARTMENT_OTHER): Admitting: Ambulatory Care

## 2021-05-03 NOTE — Telephone Encounter (Signed)
Called to check in and see how she has been doing since last PCP appt. No answer, LVM. Call back number provided.     Neta Ehlers, PharmD, BCACP, BCPS  05/03/2021  3:06 PM

## 2021-05-10 ENCOUNTER — Other Ambulatory Visit (HOSPITAL_BASED_OUTPATIENT_CLINIC_OR_DEPARTMENT_OTHER): Admitting: Internal Medicine

## 2021-05-10 NOTE — Telephone Encounter (Signed)
 Labs recently normal BUN Cr

## 2021-06-14 ENCOUNTER — Ambulatory Visit (HOSPITAL_BASED_OUTPATIENT_CLINIC_OR_DEPARTMENT_OTHER): Admitting: Internal Medicine

## 2021-08-08 ENCOUNTER — Telehealth: Payer: Self-pay

## 2021-08-08 NOTE — Telephone Encounter (Signed)
NOTES ON FILE FROM [facility] [phone], SENT REFERRAL TO SCHEDULING

## 2021-08-12 ENCOUNTER — Telehealth (HOSPITAL_BASED_OUTPATIENT_CLINIC_OR_DEPARTMENT_OTHER): Admitting: Adult Health

## 2021-08-12 ENCOUNTER — Encounter (HOSPITAL_BASED_OUTPATIENT_CLINIC_OR_DEPARTMENT_OTHER): Admitting: Internal Medicine

## 2021-08-12 MED ORDER — Ozempic 0.25 mg or 0.5 mg(2 mg/1.5 mL) pen injector
0.25 | SUBCUTANEOUS | 5 refills | 28.00000 days | Status: DC
Start: 2021-08-12 — End: 2021-10-13

## 2021-08-12 MED ORDER — Lantus U-100 Insulin 100 unit/mL injection
100 | Freq: Every day | SUBCUTANEOUS | 3 refills | Status: DC
Start: 2021-08-12 — End: 2021-08-17

## 2021-08-15 ENCOUNTER — Other Ambulatory Visit

## 2021-08-15 NOTE — Telephone Encounter (Signed)
 Hi Clerance Lav,    Brand name Lantus is not covered by the patient's insurance without a PA, but Hospital doctor and Levemir are covered. Is brand name Lantus medically necessary? If so I can start a PA request, but if not can an alternative be sent to the pharmacy to be filled?     Thanks,   Regina Eck

## 2021-08-17 ENCOUNTER — Other Ambulatory Visit

## 2021-08-17 NOTE — Telephone Encounter (Signed)
 Called patient, she currently uses Lantus vials. I advised that [name] is now preferred, patient remembers receiving something in the mail about this. Basaglar is only available in pen formulation, but she has pen needles at home and knows how to use the pen. Therefore, I advised we will send Basaglar pen prescription to her pharmacy and she can administer the same amount of units and she was with Lantus since they are equivalent. Patient understood.    She has been injecting Ozempic 0.25mg  weekly, instead of prescribed 0.5mg  weekly on med list. She thinks the Ozempic dose was decreased? I advised the 0.25mg  dose is only a tolerability dose and does not provide glycemic control, the 0.5mg  dose provides glycemic control. Patient understood and wants Dr. [name] to comment on her Ozempic dose.    [name], PharmD, BCACP, RPh  08/17/21 10:57 AM

## 2021-08-18 ENCOUNTER — Emergency Department (HOSPITAL_COMMUNITY)
Admission: EM | Admit: 2021-08-18 | Discharge: 2021-08-19 | Disposition: A | Discharge status: Home / Self Care | Payer: Medicaid Other | Attending: Emergency Medicine | Admitting: Emergency Medicine

## 2021-08-18 ENCOUNTER — Other Ambulatory Visit: Payer: Self-pay

## 2021-08-18 DIAGNOSIS — F17200 Nicotine dependence, unspecified, uncomplicated: Secondary | ICD-10-CM | POA: Insufficient documentation

## 2021-08-18 DIAGNOSIS — Z20822 Contact with and (suspected) exposure to covid-19: Secondary | ICD-10-CM | POA: Insufficient documentation

## 2021-08-18 DIAGNOSIS — D509 Iron deficiency anemia, unspecified: Secondary | ICD-10-CM | POA: Diagnosis not present

## 2021-08-18 DIAGNOSIS — N939 Abnormal uterine and vaginal bleeding, unspecified: Secondary | ICD-10-CM | POA: Insufficient documentation

## 2021-08-18 DIAGNOSIS — R102 Pelvic and perineal pain: Secondary | ICD-10-CM | POA: Insufficient documentation

## 2021-08-18 DIAGNOSIS — D649 Anemia, unspecified: Secondary | ICD-10-CM | POA: Diagnosis present

## 2021-08-18 LAB — BASIC METABOLIC PANEL
Anion gap: 8 (ref 5–15)
BUN: 11 mg/dL (ref 6–20)
CO2: 21 mmol/L — ABNORMAL LOW (ref 22–32)
Calcium: 9.4 mg/dL (ref 8.9–10.3)
Chloride: 108 mmol/L (ref 98–111)
Creatinine, Ser: 0.6 mg/dL (ref 0.44–1.00)
GFR, Estimated: >60 mL/min (ref >60)
Glucose, Bld: 88 mg/dL (ref 70–99)
Potassium: 4.1 mmol/L (ref 3.5–5.1)
Sodium: 137 mmol/L (ref 135–145)

## 2021-08-18 LAB — CBC WITH DIFFERENTIAL/PLATELET
Abs Immature Granulocytes: 0 10*3/uL (ref 0.00–0.07)
Basophils Absolute: 0.1 10*3/uL (ref 0.0–0.1)
Basophils Relative: 1 %
Eosinophils Absolute: 0.3 10*3/uL (ref 0.0–0.5)
Eosinophils Relative: 5 %
HCT: 21.1 % — ABNORMAL LOW (ref 36.0–46.0)
Hemoglobin: 5.3 g/dL — CL (ref 12.0–15.0)
Lymphocytes Relative: 18 %
Lymphs Abs: 0.9 10*3/uL (ref 0.7–4.0)
MCH: 14.4 pg — ABNORMAL LOW (ref 26.0–34.0)
MCHC: 25.1 g/dL — ABNORMAL LOW (ref 30.0–36.0)
MCV: 57.3 fL — ABNORMAL LOW (ref 80.0–100.0)
Monocytes Absolute: 0.2 10*3/uL (ref 0.1–1.0)
Monocytes Relative: 4 %
Neutro Abs: 3.6 10*3/uL (ref 1.7–7.7)
Neutrophils Relative %: 72 %
Platelets: 296 10*3/uL (ref 150–400)
RBC: 3.68 MIL/uL — ABNORMAL LOW (ref 3.87–5.11)
RDW: 22.2 % — ABNORMAL HIGH (ref 11.5–15.5)
Smear Review: NORMAL
WBC: 5 10*3/uL (ref 4.0–10.5)
nRBC: 0 % (ref 0.0–0.2)

## 2021-08-18 LAB — RESP PANEL BY RT-PCR (FLU A&B, COVID) ARPGX2
Influenza A by PCR: NEGATIVE
Influenza B by PCR: NEGATIVE
SARS Coronavirus 2 by RT PCR: NEGATIVE

## 2021-08-18 LAB — PREPARE RBC (CROSSMATCH)

## 2021-08-18 LAB — I-STAT BETA HCG BLOOD, ED (MC, WL, AP ONLY): I-stat hCG, quantitative: <5 m[IU]/mL (ref <5)

## 2021-08-18 LAB — ABO/RH: ABO/RH(D): O POS

## 2021-08-18 MED ORDER — MEGESTROL ACETATE 20 MG PO TABS: 20.0000 mg | ORAL_TABLET | Freq: Two times a day (BID) | ORAL | 0 refills | Status: AC

## 2021-08-18 MED ORDER — MEGESTROL ACETATE 20 MG PO TABS
20.0000 mg | ORAL_TABLET | Freq: Once | ORAL | Status: AC
  Administered 2021-08-19: 20 mg via ORAL
  Filled 2021-08-18: qty 1

## 2021-08-18 MED ORDER — SODIUM CHLORIDE 0.9 % IV SOLN
10.0000 mL/h | Freq: Once | INTRAVENOUS | Status: AC
  Administered 2021-08-19: 10 mL/h via INTRAVENOUS

## 2021-08-18 NOTE — ED Provider Notes (Signed)
I personally evaluated the patient during the encounter and completed a history, physical, procedures, medical decision making to contribute to the overall care of the patient and decision making for the patient briefly, the patient is a 45 y.o. female here for blood transfusion.  Was establishing care with primary care doctor.  Has had a history of anemia thought to be from heavy menstrual cycles.  MCV is 57.  Overall suspect that she has an iron deficiency anemia.  Hemoglobin 5.3.  She denies any black or bloody stools.  She is had bleeding work-up done in the past but she has not been able to establish with an OB/GYN yet.  She is overall asymptomatic.  She is not having lightheadedness, chest pain or shortness of breath.  Blood pressure has been normal here.  We will touch base with OB/GYN for recommendations for birth control and suspect we will use Megace.  We will transfuse her packed red blood cells and discharge as patient does not want to be admitted which I think is reasonable.  She is established with primary care we will have her follow-up with OB/GYN.  This chart was dictated using voice recognition software.  Despite best efforts to proofread,  errors can occur which can change the documentation meaning.    EKG Interpretation  Date/Time:  Thursday August 18 2021 18:12:22 EDT Ventricular Rate:  76 PR Interval:  152 QRS Duration: 62 QT Interval:  362 QTC Calculation: 407 R Axis:   70 Text Interpretation: Normal sinus rhythm Septal infarct , age undetermined Abnormal ECG Confirmed by Virgina Norfolk [phone]) on 08/18/2021 10:38:25 PM            Virgina Norfolk, DO 08/18/21 2250

## 2021-08-18 NOTE — ED Provider Notes (Signed)
Emergency Medicine Provider Triage Evaluation Note  Pamela Farley , Farley 45 y.o. female  was evaluated in triage.  Pt complains of abnormal lab.  States was seen by PCP earlier this week to establish care.  Noted hemoglobin of 5.  Patient states she has chronic anemia and takes iron supplementation.  States she has had anemia issues since she was Farley child.  Has needed blood transfusions almost yearly.  She has chronic heavy menstrual cycles.  She is currently on day 1 of this current cycle.  States she typically has to change Farley pad or Farley tampon about every hour which is chronic for her.  She has some chronic fatigue and generalized weakness however states this is been ongoing.  No headache, syncope, chest pain, abdominal pain  Review of Systems  Positive: Weakness, abnormal lab Negative: Chest pain, shortness of breath, chest pain, abdominal pain  Physical Exam  BP (!) 137/91   Pulse 77   Temp 98.7 F (37.1 C)   Resp 19   SpO2 99%  Gen:   Awake, no distress   EYE:  Pale conjunctiva  Resp:  Normal effort  MSK:   Moves extremities without difficulty  Other:    Medical Decision Making  Medically screening exam initiated at 6:08 PM.  Appropriate orders placed.  Pamela Farley was informed that the remainder of the evaluation will be completed by another provider, this initial triage assessment does not replace that evaluation, and the importance of remaining in the ED until their evaluation is complete  Abnormal lab  VS stable   Pamela Mccuiston A, PA-C 08/18/21 1809    Benjiman Core, MD 08/20/21 1055

## 2021-08-18 NOTE — Discharge Instructions (Signed)
Please read and follow all provided instructions.  Your diagnoses today include:  1. Symptomatic anemia   2. Microcytic anemia   3. Vaginal bleeding     Tests performed today include: Complete blood cell count: low hemoglobin of 5.3, suspect due to low iron level Basic metabolic panel: No problems Pregnancy test (urine or blood, in women only): Vital signs. See below for your results today.   Medications prescribed:  Megace - medication to help slow vaginal bleeding  Take any prescribed medications only as directed.  Home care instructions:  Follow any educational materials contained in this packet.  Follow-up instructions: Please follow-up with your primary care provider in the next 3 days for further evaluation of your symptoms.   I have provided referral for OB/GYN clinic here at [facility].  Call for an appointment if you are unable to get an appointment through your primary care doctor.  Return instructions:  Please return to the Emergency Department if you experience worsening symptoms.  Return with heavier bleeding, if you pass out, develop significant shortness of breath or abdominal/pelvic pain Please return if you have any other emergent concerns.  Additional Information:  Your vital signs today were: BP 125/79   Pulse 73   Temp 98.7 F (37.1 C)   Resp 20   SpO2 100%  If your blood pressure (BP) was elevated above 135/85 this visit, please have this repeated by your doctor within one month. --------------

## 2021-08-18 NOTE — ED Provider Notes (Signed)
[facility] EMERGENCY DEPARTMENT Provider Note   CSN: [id number] Arrival date & time: 08/18/21  1704     History Chief Complaint  Patient presents with   Abnormal Lab    Pamela Farley is a 45 y.o. female.  Patient with history of iron deficiency anemia, chronically heavy menstrual periods --presents to the emergency department today for low hemoglobin.  Patient recently establishing primary care in the area.  She had labs drawn which showed a low hemoglobin.  On recheck here was 5.3.  Today is day one of the patient's menstrual period.  She has heavy bleeding which is typical for her.  She changes a pad or tampon every hour.  She is passing clots.  Otherwise she does report pica, approximately 1 blood transfusion per year over the past 2 to 3 years.  She does not remember having an ultrasound or work-up.  She has lived several different places over the past several years.  Overall she feels well.  She has had mild exertional shortness of breath more so recently but no chest pain, lightheadedness, syncope.  She denies blood loss in the stool.  Per previous records, she has received IV iron on a least 1 ED visit at outside hospital.  She is working to establish OB/GYN follow-up through her PCP.  She takes Geritol for iron supplementation.  States that she typically receives 2 units of blood and then is discharged. The onset of this condition was acute. The course is constant. Aggravating factors: none. Alleviating factors: none.        No past medical history on file.  There are no problems to display for this patient.   No past surgical history on file.   OB History   No obstetric history on file.     No family history on file.  Social History   Tobacco Use   Smoking status: Every Day    Home Medications Prior to Admission medications   Not on File    Allergies    Patient has no known allergies.  Review of Systems   Review of Systems   Constitutional:  Negative for fever.  HENT:  Negative for rhinorrhea and sore throat.   Eyes:  Negative for redness.  Respiratory:  Negative for cough.   Cardiovascular:  Negative for chest pain.  Gastrointestinal:  Negative for abdominal pain, blood in stool, diarrhea, nausea and vomiting.  Genitourinary:  Positive for pelvic pain (menstrual cramping) and vaginal bleeding. Negative for dysuria, frequency, hematuria and urgency.  Musculoskeletal:  Negative for myalgias.  Skin:  Negative for rash.  Neurological:  Negative for headaches.   Physical Exam Updated Vital Signs BP 125/79   Pulse 73   Temp 98.7 F (37.1 C)   Resp 20   SpO2 100%   Physical Exam Vitals and nursing note reviewed.  Constitutional:      General: She is not in acute distress.    Appearance: She is well-developed.  HENT:     Head: Normocephalic and atraumatic.     Right Ear: External ear normal.     Left Ear: External ear normal.     Nose: Nose normal.  Eyes:     Comments: Pale conjunctiva  Cardiovascular:     Rate and Rhythm: Normal rate and regular rhythm.     Heart sounds: No murmur heard. Pulmonary:     Effort: No respiratory distress.     Breath sounds: No wheezing, rhonchi or rales.  Abdominal:  Palpations: Abdomen is soft.     Tenderness: There is no abdominal tenderness. There is no guarding or rebound.  Musculoskeletal:     Cervical back: Normal range of motion and neck supple.     Right lower leg: No edema.     Left lower leg: No edema.  Skin:    General: Skin is warm and dry.     Findings: No rash.  Neurological:     General: No focal deficit present.     Mental Status: She is alert. Mental status is at baseline.     Motor: No weakness.     Comments: Patient up walking in room without any difficulty.  Psychiatric:        Mood and Affect: Mood normal.    ED Results / Procedures / Treatments   Labs (all labs ordered are listed, but only abnormal results are displayed) Labs  Reviewed  CBC WITH DIFFERENTIAL/PLATELET - Abnormal; Notable for the following components:      Result Value   RBC 3.68 (*)    Hemoglobin 5.3 (*)    HCT 21.1 (*)    MCV 57.3 (*)    MCH 14.4 (*)    MCHC 25.1 (*)    RDW 22.2 (*)    All other components within normal limits  BASIC METABOLIC PANEL - Abnormal; Notable for the following components:   CO2 21 (*)    All other components within normal limits  RESP PANEL BY RT-PCR (FLU A&B, COVID) ARPGX2  PROTIME-INR  I-STAT BETA HCG BLOOD, ED ([facility], [facility], [facility] ONLY)  I-STAT BETA HCG BLOOD, ED ([facility], [facility], [facility] ONLY)  TYPE AND SCREEN  ABO/RH  PREPARE RBC (CROSSMATCH)    EKG EKG Interpretation  Date/Time:  Thursday August 18 2021 18:12:22 EDT Ventricular Rate:  76 PR Interval:  152 QRS Duration: 62 QT Interval:  362 QTC Calculation: 407 R Axis:   70 Text Interpretation: Normal sinus rhythm Septal infarct , age undetermined Abnormal ECG Confirmed by Virgina Norfolk [phone]) on 08/18/2021 10:38:25 PM  Radiology No results found.  Procedures Procedures   Medications Ordered in ED Medications  0.9 %  sodium chloride infusion (has no administration in time range)  megestrol (MEGACE) tablet 20 mg (has no administration in time range)    ED Course  I have reviewed the triage vital signs and the nursing notes.  Pertinent labs & imaging results that were available during my care of the patient were reviewed by me and considered in my medical decision making (see chart for details).  Patient seen and examined.  Hemoglobin is low.  Discussed transfusion with patient and she agrees.  Offered admission for further work-up however she declines stating that she can follow-up with her primary care doctor next week and also feels generally well.  Plan is to give 2 units packed red blood cells.  I did speak with on-call OB/GYN Dr. Alysia Penna.  Recommends starting patient on Megace or Aygestin.  Would continue until she follows up.  Patient updated, will give a  dose Megace 20 mg here and continue twice daily.  Vital signs reviewed and are as follows: BP 125/79   Pulse 73   Temp 98.7 F (37.1 C)   Resp 20   SpO2 100%   Signout to oncoming provider at shift change.  10:46 PM patient appears well, stable on recheck.  Denies lightheadedness  11:14 PM Confirmed negative pregnancy.   Dr. Preston Fleeting aware of patient at signout.   CRITICAL CARE Performed  by: Renne Crigler PA-C Total critical care time: 35 minutes Critical care time was exclusive of separately billable procedures and treating other patients. Critical care was necessary to treat or prevent imminent or life-threatening deterioration. Critical care was time spent personally by me on the following activities: development of treatment plan with patient and/or surrogate as well as nursing, discussions with consultants, evaluation of patient's response to treatment, examination of patient, obtaining history from patient or surrogate, ordering and performing treatments and interventions, ordering and review of laboratory studies, ordering and review of radiographic studies, pulse oximetry and re-evaluation of patient's condition.     MDM Rules/Calculators/A&P                           Patient with microcytic anemia in setting of chronically heavy menstrual bleeding.  Patient has required blood transfusion in the past.  She is well compensated and is only minimally symptomatic at the current time with mild shortness of breath with exertion.  She looks well, no dizziness with standing.  She is in the process of establishing care with OB/GYN through her new PCP who sent her in today.  Plan is for patient to receive 2 L of packed red blood cells and discharged home after transfusion.  She will be started on Megace after discussion with on-call OB/GYN.  Final Clinical Impression(s) / ED Diagnoses Final diagnoses:  Symptomatic anemia  Microcytic anemia  Vaginal bleeding    Rx / DC Orders ED  Discharge Orders          Ordered    megestrol (MEGACE) 20 MG tablet  2 times daily        08/18/21 2248             Renne Crigler, PA-C 08/18/21 2316    Virgina Norfolk, DO 08/19/21 2256

## 2021-08-18 NOTE — ED Notes (Signed)
Triage provider Fredricka Bonine, APP aware of critical lab Hgb 5.3

## 2021-08-18 NOTE — ED Triage Notes (Signed)
Pt here with reports of hgb 5. Pt states hx of anemia. Denies chest pain, shob. Pt endorses heavy periods.

## 2021-08-19 ENCOUNTER — Encounter (HOSPITAL_BASED_OUTPATIENT_CLINIC_OR_DEPARTMENT_OTHER): Admitting: Internal Medicine

## 2021-08-19 NOTE — Telephone Encounter (Signed)
 Sent portal message to pt Heidi Sole, MD

## 2021-08-20 LAB — TYPE AND SCREEN
ABO/RH(D): O POS
Antibody Screen: NEGATIVE
Unit division: 0
Unit division: 0

## 2021-08-20 LAB — BPAM RBC
Blood Product Expiration Date: 202211042359
Blood Product Expiration Date: 202211042359
ISSUE DATE / TIME: 202210132243
ISSUE DATE / TIME: 202210140044
Unit Type and Rh: 5100
Unit Type and Rh: 5100

## 2021-10-05 ENCOUNTER — Ambulatory Visit (HOSPITAL_BASED_OUTPATIENT_CLINIC_OR_DEPARTMENT_OTHER): Payer: Medicaid Other | Admitting: Cardiovascular Disease

## 2021-10-06 ENCOUNTER — Other Ambulatory Visit

## 2021-10-12 NOTE — Progress Notes (Deleted)
    Cardiology Office Note   Date:  10/12/2021   ID:  Arron Mcnaught, DOB Jun 10, 1976, MRN [id number]  PCP:  Patient, No Pcp Per (Inactive)  Cardiologist:   None Referring:  ***  No chief complaint on file.     History of Present Illness: Pamela Farley is a 45 y.o. female who is referred by *** for evaluation of a murmur.       No past medical history on file.  No past surgical history on file.   Current Outpatient Medications  Medication Sig Dispense Refill   megestrol (MEGACE) 20 MG tablet Take 1 tablet (20 mg total) by mouth 2 (two) times daily. 60 tablet 0   No current facility-administered medications for this visit.    Allergies:   Patient has no known allergies.    Social History:  The patient  reports that she has been smoking. She does not have any smokeless tobacco history on file.   Family History:  The patient's ***family history is not on file.    ROS:  Please see the history of present illness.   Otherwise, review of systems are positive for {NONE DEFAULTED:18576}.   All other systems are reviewed and negative.    PHYSICAL EXAM: VS:  There were no vitals taken for this visit. , BMI There is no height or weight on file to calculate BMI. GENERAL:  Well appearing HEENT:  Pupils equal round and reactive, fundi not visualized, oral mucosa unremarkable NECK:  No jugular venous distention, waveform within normal limits, carotid upstroke brisk and symmetric, no bruits, no thyromegaly LYMPHATICS:  No cervical, inguinal adenopathy LUNGS:  Clear to auscultation bilaterally BACK:  No CVA tenderness CHEST:  Unremarkable HEART:  PMI not displaced or sustained,S1 and S2 within normal limits, no S3, no S4, no clicks, no rubs, *** murmurs ABD:  Flat, positive bowel sounds normal in frequency in pitch, no bruits, no rebound, no guarding, no midline pulsatile mass, no hepatomegaly, no splenomegaly EXT:  2 plus pulses throughout, no edema, no cyanosis no clubbing SKIN:  No  rashes no nodules NEURO:  Cranial nerves II through XII grossly intact, motor grossly intact throughout PSYCH:  Cognitively intact, oriented to person place and time    EKG:  EKG {ACTION; IS/IS JJK:[id number]} ordered today. The ekg ordered today demonstrates ***   Recent Labs: 08/18/2021: BUN 11; Creatinine, Ser 0.60; Hemoglobin 5.3; Platelets 296; Potassium 4.1; Sodium 137    Lipid Panel No results found for: CHOL, TRIG, HDL, CHOLHDL, VLDL, LDLCALC, LDLDIRECT    Wt Readings from Last 3 Encounters:  08/18/21 220 lb (99.8 kg)      Other studies Reviewed: Additional studies/ records that were reviewed today include: ***. Review of the above records demonstrates:  Please see elsewhere in the note.  ***   ASSESSMENT AND PLAN:  MURMUR:  ***   Current medicines are reviewed at length with the patient today.  The patient {ACTIONS; HAS/DOES NOT HAVE:19233} concerns regarding medicines.  The following changes have been made:  {PLAN; NO CHANGE:13088:s}  Labs/ tests ordered today include: *** No orders of the defined types were placed in this encounter.    Disposition:   FU with ***    Signed, Rollene Rotunda, MD  10/12/2021 4:39 PM    [facility]

## 2021-10-13 ENCOUNTER — Ambulatory Visit: Payer: Medicaid Other | Admitting: Cardiology

## 2021-10-13 ENCOUNTER — Ambulatory Visit: Attending: Internal Medicine | Admitting: Internal Medicine

## 2021-10-13 ENCOUNTER — Other Ambulatory Visit

## 2021-10-13 ENCOUNTER — Encounter (HOSPITAL_BASED_OUTPATIENT_CLINIC_OR_DEPARTMENT_OTHER): Admitting: Internal Medicine

## 2021-10-13 ENCOUNTER — Encounter

## 2021-10-13 VITALS — BP 120/80 | HR 89 | Temp 97.6°F | Ht 66.0 in | Wt 213.0 lb

## 2021-10-13 DIAGNOSIS — E113299 Type 2 diabetes mellitus with mild nonproliferative diabetic retinopathy without macular edema, unspecified eye: Secondary | ICD-10-CM

## 2021-10-13 DIAGNOSIS — R87619 Unspecified abnormal cytological findings in specimens from cervix uteri: Secondary | ICD-10-CM

## 2021-10-13 LAB — POCT HGB A1C: POCT Hemoglobin A1C: 7.4 %

## 2021-10-13 MED ORDER — FreeStyle Libre sensor system (FreeStyle Libre 14 Day Sensor) kit
11 refills | Status: DC
Start: 2021-10-13 — End: 2021-11-14

## 2021-10-13 MED ORDER — Ozempic 0.25 mg or 0.5 mg(2 mg/1.5 mL) pen injector
0.25 | SUBCUTANEOUS | 5 refills | 28.00000 days | Status: DC
Start: 2021-10-13 — End: 2021-11-14

## 2021-10-13 NOTE — Progress Notes (Signed)
 [facility] [place]  [address]  [address]  [place] [address]  [phone]      Chief Complaint   Patient presents with   . Annual Exam        HISTORY    [name] is a 45 y.o. female presenting for annual exam          DM   Lab Results   Component Value Date    HGBA1C 7.4 10/13/2021     Lab Results   Component Value Date    MICROALBCREA  02/17/2021      Comment:      Unable to calculate due to below or above Analytical Measurement Range.    HGBA1C 7.4 10/13/2021    LDLCALC 85 02/17/2021           On basaglar  Likes ozempic--pharmacy didn't have it in stock// has been out of it 2 months   38 U basaglar   Doesn't have Libre now/not hcking sugars     Wt Readings from Last 5 Encounters:   10/13/21 96.6 kg   02/17/21 89.9 kg   08/03/20 103 kg   02/12/20 105 kg   05/29/19 103 kg     HTN-no dizyness  High chol-on atorvastatin  Abnormal pap, had benign colpo after LSIL pap 2/22, colpo 4/22 benign; 1 year cotest advised    PAST MEDICAL HISTORY  Patient Active Problem List   Diagnosis   . Unspecified abnormal cytological findings in specimens from cervix uteri   . 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 Horner's syndrome   . Fatigue   . Bilateral carpal tunnel syndrome   . Closed dislocation of patella   . High cholesterol   . Hypertension   . Hidradenitis   . Marijuana smoker   . Depression, major, in remission (CMS/HCC)        SURGICAL HISTORY  She has no past surgical history on file.    SOCIAL HISTORY  She reports that she has quit smoking. She does not have any smokeless tobacco history on file. She reports current drug use. Drug: Marijuana. No history on file for alcohol use.  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    Health Care Proxy: Mom    Tobacco:none    Alcohol: 3/week; no alcohol now     Drugs:MJ, 4 days/week; 2 joints QOD         Diet:diabetic ; going out at work/eating what isn't supposed to     Exercise: could do more; now and then    Car safety-seatbelts/texting:discussed        Partner: FOB ; using condoms 90%//currently not SA    DV:no    STI history:    gc, chlamydia, hpv, herpes                      HIV  testing: 2021 neg     Contraception: currently not SA, Mirena in past    Abnormal pap history: 9/10 ascus; colpo neg; 12/11 colpo lsil; 6/12 ascus, hpv not done; 4/13 pap neg, hr hpv pos, colpo neg; 5/14 pap/hpv neg;  pap May 2015-neg pap, did not receive hpv results; 2/22 LSIL , hpv pos, colpo benign; 1 year cotest advised            ALLERGIES  Penicillin and Penicillin v potassium     MEDICATIONS    Current Outpatient Medications:   .  atorvastatin (Lipitor) 40 mg tablet, Take 1 tablet by mouth in the morning., Disp: , Rfl:   .  BD Nano 2nd Gen Pen Needle 32 gauge x 5/32" needle, USE AS DIRECTED TO ADMINISTER MEDICATION FROM PEN 4 TIMES DAILY, Disp: , Rfl:   .  FreeStyle Libre sensor system (FreeStyle Libre 14 Day Sensor) kit, Use as instructed, Disp: 2 each, Rfl: 11  .  HumaLOG KwikPen Insulin 100 unit/mL injection, USE 6 UNITS BEFORE EACH MEAL, Disp: , Rfl:   .  hydroCHLOROthiazide (HYDRODiuril) 25 mg tablet, Take 25 mg by mouth in the morning., Disp: , Rfl:   .  insulin glargine (Basaglar KwikPen U-100 Insulin) 100 unit/mL (3 mL) injection, Inject 38 Units under the skin in the morning., Disp: 15 mL, Rfl: 11  .  lisinopril 10 mg tablet, TAKE 1 TABLET BY MOUTH EVERY DAY, Disp: 90 tablet, Rfl: 2  .  Ozempic 0.25 mg or 0.5 mg(2 mg/1.5 mL) pen injector, INJECT 0.5MG  UNDER SKIN WEEKLY, Disp: 1 each, Rfl: 5    All  other review of systems were reviewed and were negative.    PHYSICAL EXAM    Last Recorded Vitals  Blood pressure 120/80, pulse 89, temperature 36.4 C (97.6 F), temperature source Oral, height 1.676 m, weight 96.6 kg, SpO2 100 %.    Constitutional:       General: No acute distress      Appearance: Normal appearance.   HENT:      Head: Normocephalic and atraumatic.      Mouth: Mucous membranes are moist.      Pharynx: Oropharynx is clear.   Eyes:      Extraocular Movements: Extraocular movements intact.      Conjunctiva/sclera: Conjunctivae normal.      left lid droop with small pupil   Neck:      Vascular: No carotid bruit.   Cardiovascular:      Rate and Rhythm: Normal rate and regular rhythm.      Pulses: Normal pulses.      Heart sounds: Normal heart sounds.   Pulmonary:      Effort: Pulmonary effort is normal. No respiratory distress.      Breath sounds: Normal breath sounds.    Abdominal:      General: Bowel sounds are normal. There is no distension.      Palpations: Abdomen is soft. There is no mass.      Tenderness: There is no abdominal tenderness. There is no guarding or rebound.   Musculoskeletal:         General: No swelling, tenderness or deformity. Normal range of motion.      Cervical back: No rigidity or tenderness.   Lymphadenopathy:      Cervical: No cervical adenopathy.   Skin:     General: Skin is warm and dry.      Findings: No lesion.   Neurological:      General:  No focal deficit present.        Psychiatric:         Mood and Affect: Mood normal.         Behavior: Behavior normal.         Thought Content: Thought content normal.         Judgment: Judgment normal.   Breast: normal b/l without skin changes or dimpling  Axilla: no LAN          ASSESSMENT/PLAN    45 y.o. female who presents for annual         Lab Results   Component Value Date    WBC 6.7 02/17/2021    HGB 11.6 02/17/2021    HCT 35.2 02/17/2021    PLT 323 02/17/2021    CHOL 153 02/17/2021    TRIG 93 02/17/2021    HDL 49 02/17/2021     LDLCALC 85 02/17/2021    ALT 22 02/17/2021    AST 21 02/17/2021    NA 140 02/17/2021    K 3.4 (L) 02/17/2021    CL 104 02/17/2021    CREATININE 0.98 02/17/2021    BUN 17 02/17/2021    EGFR 73 02/17/2021    CO2 26 02/17/2021    TSH 0.77 02/17/2021    HGBA1C 7.4 10/13/2021    MICROALBUR <1.0 02/17/2021        Appointment on 02/17/2021   Component Date Value Ref Range Status   . Ferritin 02/17/2021 42.0  10.0 - 307.0 ng/mL Final   . Sodium 02/17/2021 140  135 - 146 mmol/L Final   . Potassium 02/17/2021 3.4 (L)  3.6 - 5.2 mmol/L Final   . Chloride 02/17/2021 104  98 - 110 mmol/L Final   . CO2 (Bicarbonate) 02/17/2021 26  20 - 32 mmol/L Final   . Anion Gap 02/17/2021 10  3 - 14 mmol/L Final   . BUN 02/17/2021 17  6 - 24 mg/dL Final   . Creatinine 30/86/5784 0.98  0.55 - 1.30 mg/dL Final   . eGFRcr 69/62/9528 73  >60 mL/min/1.68m*2 Final   . Glucose 02/17/2021 142 (H)  70 - 139 mg/dL Final   . Fasting? 41/32/4401 Unknown   Final   . Calcium 02/17/2021 9.3  8.5 - 10.5 mg/dL Final   . WBC 02/72/5366 6.7  4.0 - 11.0 K/uL Final   . RBC 02/17/2021 3.81  3.70 - 5.20 M/uL Final   . Hemoglobin 02/17/2021 11.6  11.0 - 16.0 g/dL Final   . Hematocrit 44/01/4741 35.2  32.0 - 47.0 % Final   . MCV 02/17/2021 92.4  80.0 - 100.0 fL Final   . MCH 02/17/2021 30.4  26.0 - 34.0 pg Final   . MCHC 02/17/2021 33.0  31.0 - 37.0 g/dL Final   . RDW-SD 59/56/3875 56.6 (H)  35.0 - 51.0 fL Final   . RDW-CV 02/17/2021 16.7 (H)  11.5 - 14.5 % Final   . Platelets 02/17/2021 323  150 - 400 K/uL Final   . MPV 02/17/2021 9.6  9.1 - 12.4 fL Final   . NRBC % 02/17/2021 0.0  0.0 - 0.0 % Final   . NRBC Absolute 02/17/2021 0.00  0.00 - 2.00 K/uL Final   . Hemoglobin A1C 02/17/2021 5.7 (H)  <5.6 % Final    Non-diabetic: < 6.0 %  Diabetic: Goal:  < 7.0%  Action suggested:  > 8.0%   . Triglycerides 02/17/2021 93  <=150 mg/dL Final   . Cholesterol 64/33/2951 153  <=  200 mg/dL Final   . HDL cholesterol 02/17/2021 49  >=40 mg/dL Final   . LDL cholesterol,  calculated 02/17/2021 85  0 - 130 mg/dL Final   . Cholesterol/HDL Ratio 02/17/2021 3.1   Final   . Albumin, urine 02/17/2021 <1.0  mg/dL Final   . Creatinine, urine, random 02/17/2021 97.87  15.00 - 328.00 mg/dL Final   . Albumin/Creatinine ratio 02/17/2021    Final    Unable to calculate due to below or above Analytical Measurement Range.   . AST 02/17/2021 21  6 - 42 U/L Final   . ALT 02/17/2021 22  0 - 55 U/L Final   . TSH 02/17/2021 0.77  0.35 - 4.94 uIU/mL Final   . Iron 02/17/2021 52  30 - 180 ug/dL Final   . TIBC 16/08/9603 388  253 - 463 ug/dL Final   . Transferrin 54/07/8118 277  181 - 331 mg/dL Final   . 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 Advised basaglar decrease to 34 Unit now    refill on CGM sensors   on ace  Lab Results   Component Value Date    [facility]  02/17/2021      Comment:      Unable to calculate due to below or above Analytical Measurement Range.    HGBA1C 7.4 10/13/2021    LDLCALC 85 02/17/2021       Marijuana smo ker  Assessment & Plan:  Advised d/c  Depression, major, in remission (CMS/HCC)  Assessment & Plan:  Off meds, in remission  Horner's syndrome  Assessment & Plan:  F/b [facility]; would like to be seen here-refer   Primary hypertension  Assessment & Plan:  Stable on Lisinopril 10/hctz 25; check bmp; pt understands she is not to get pregnant on ACE (declines further contraception, does n ot want IUD, not currenlty SA)  High cholesterol  Assessment & Plan:  Restarted atorvastatin/has not currenlty been adherent (last LDL NOT on atorvastatin now back on )   Lab Results   Component Value Date     LDLCALC 85 02/17/2021       Unspecified abnormal cytological findings in specimens from cervix uteri  LSIL 2/22 pap, 4/22 benign colpo at [facility]. 1 year cotest f/u advised                       Last CE: 5/15; 1/17; 4/18 ; 12/19; 9/21 ; 12/22  Pap: /10 ascus; colpo neg; 12/11 colpo lsil; 6/12 ascus, hpv not done; 4/13 pap neg, hr hpv pos, colpo neg; 5/14 pap/hpv neg;  pap May 2015-neg pap, did not receive hpv results; 2/22 LSIL , hpv pos, colpo benign; 1 year cotest advised     Mammo:ni; 8/17 on mammo van; order  Colo:order cologuard   BMD:ni  Eye care:due 4/20; has appt 9/21; order   Dental: utd  Choleseterol   Lab Results   Component Value Date    LDLCALC 85 02/17/2021       Risk Score Cholesterol: on high intensity statin  Tdap: 2012,2018  MMR: vax 1978, 1994  Varicella:kid  Hep B:3 vax  Pvax:2011  Flu 2015, 2016;2019; got 2022  Zoster:ni  HPV:ni  covid vax x 3  [name], MD  10/14/21   4:32 PM    Follow up in about 3 months (around 01/11/2022).

## 2021-10-13 NOTE — Patient Instructions (Addendum)
 Restart ozempic at point25 mL of 2 mg/1.5 mL  After 4 weeks increase to point5 mg dose. Decrease the basaglar to 34 units when you do that  If you have any lows, call me ASAP

## 2021-10-14 MED ORDER — insulin glargine (Basaglar KwikPen U-100 Insulin) 100 unit/mL (3 mL) injection
100 | Freq: Every morning | SUBCUTANEOUS | 11 refills | 60.00000 days | Status: DC
Start: 2021-10-14 — End: 2021-11-14

## 2021-11-06 ENCOUNTER — Other Ambulatory Visit (HOSPITAL_BASED_OUTPATIENT_CLINIC_OR_DEPARTMENT_OTHER): Admitting: Internal Medicine

## 2021-11-09 ENCOUNTER — Other Ambulatory Visit

## 2021-11-14 ENCOUNTER — Ambulatory Visit
Admit: 2021-11-14 | Discharge: 2021-11-14 | Payer: BLUE CROSS/BLUE SHIELD | Attending: Ambulatory Care | Primary: Internal Medicine

## 2021-11-14 ENCOUNTER — Encounter

## 2021-11-14 ENCOUNTER — Other Ambulatory Visit

## 2021-11-14 ENCOUNTER — Encounter (HOSPITAL_BASED_OUTPATIENT_CLINIC_OR_DEPARTMENT_OTHER): Admitting: Ambulatory Care

## 2021-11-14 ENCOUNTER — Encounter (HOSPITAL_BASED_OUTPATIENT_CLINIC_OR_DEPARTMENT_OTHER)

## 2021-11-14 VITALS — BP 125/70 | HR 75 | Temp 97.6°F | Ht 65.98 in | Wt 220.4 lb

## 2021-11-14 DIAGNOSIS — Z794 Long term (current) use of insulin: Secondary | ICD-10-CM

## 2021-11-14 MED ORDER — pen needle, diabetic (BD Nano 2nd Gen Pen Needle) 32 gauge x 5/32" needle
32 | 3 refills | 90.00000 days | Status: AC
Start: 2021-11-14 — End: ?

## 2021-11-14 MED ORDER — Ozempic 0.25 mg or 0.5 mg(2 mg/1.5 mL) pen injector
0.25 | SUBCUTANEOUS | 5 refills | 28.00000 days | Status: DC
Start: 2021-11-14 — End: 2021-12-22

## 2021-11-14 MED ORDER — atorvastatin (Lipitor) 40 mg tablet
40 | ORAL_TABLET | Freq: Every day | ORAL | 3 refills | 90.00000 days | Status: AC
Start: 2021-11-14 — End: ?

## 2021-11-14 MED ORDER — insulin glargine (Basaglar KwikPen U-100 Insulin) 100 unit/mL (3 mL) injection
100 | Freq: Every morning | SUBCUTANEOUS | 11 refills | 60.00000 days | Status: AC
Start: 2021-11-14 — End: ?

## 2021-11-14 MED ORDER — blood-glucose sensor (FreeStyle Libre 3 Sensor) device
3 refills | 30.00000 days | Status: DC
Start: 2021-11-14 — End: 2021-12-12

## 2021-11-14 NOTE — Patient Instructions (Signed)
It was good seeing you today! Plan from today's visit:   Walking/dancing 2x/week at least 30 minutes  Restart Ozempic 0.25 mg weekly and Basaglar 36 units daily, if Blood sugar over 200, go up to 38 daily   Download and connect app for [employer] 3    Please call the office if you have any questions or concerns.

## 2021-11-14 NOTE — Progress Notes (Signed)
[facility] [place]    [name] is a 46 y.o. female, to establish clinical pharmacy services for collaborative management of diabetes with Angelena SoleJulie Tishler, MD. [name] also has hypertension, obesity class 1.    Plan at last visit: Restart Ozempic titration up to 0.5 mg weekly, decrease Basaglar to 34 units daily    Events since last visit: did not have scripts at pharmacy    DIABETES    Current medications: Ozempic 0.5 mg weekly, Basaglar 34 units (not taking)  Previously tried:    Meter: not checking  Hypoglycemia: no s/sxs  Testing BG: not checking  Home BG readings: N/A    A1c Trend:   Lab Results   Component Value Date    HGBA1C 7.4 10/13/2021    HGBA1C 5.7 (H) 02/17/2021    HGBA1C 5.6 02/17/2021     Albumin/Creatinine ratio   Date Value Ref Range Status   02/17/2021   Final     Comment:     Unable to calculate due to below or above Analytical Measurement Range.       Preventive Therapy  Health Maintenance Due   Topic Date Due   . Diabetes: Foot Exam  Never done   . Pneumococcal Vaccine: Pediatrics (0 to 5 Years) and At-Risk Patients (6 to 64 Years) (2 - PCV) 01/26/2011   . Influenza Vaccine (1) 07/07/2021     ACEi/ARB: lisinopril 10 mg  ASA: no  Statin: atorvastatin 40 mg    LIFESTYLE    Diet: snacking late at night  Beverages: water, no soda  Alcohol:   Activity/Exercise: minimal, [facility]      MEDICATION MANAGEMENT    [name] does not have assistance managing and taking medications.     Medication side effects: none  Medication cost: no issue  Medication adherence: not taking at the moment due to lack of refills at pharmacy    Medication list, fill history, and drug allergies reviewed with the patient. Patient is a good historian of medications and indications. The following discrepancies were discovered: none    Vitals 02/17/2021 10/13/2021 10/13/2021   Systolic 121 145 161120   Diastolic 71 83 80   Pulse 97 89 -   Temp 98.5 97.6 -   Height (in) 65 66 -   Weight (lb) 198.2  213 -   SpO2 - 100 -   BMI (kg/m2) 32.98 kg/m2 34.38 kg/m2 -   BSA (m2) 2.03 m2 2.12 m2 -   VISIT REPORT - - -   Some recent data might be hidden       Current medications:   Outpatient Encounter Medications as of 11/14/2021   Medication Sig Dispense Refill   . atorvastatin (Lipitor) 40 mg tablet Take 1 tablet by mouth in the morning.     . BD Nano 2nd Gen Pen Needle 32 gauge x 5/32" needle USE AS DIRECTED TO ADMINISTER MEDICATION FROM PEN 4 TIMES DAILY     . FreeStyle Libre sensor system (FreeStyle Libre 14 Day Sensor) kit Use as instructed 2 each 11   . HumaLOG KwikPen Insulin 100 unit/mL injection USE 6 UNITS BEFORE EACH MEAL     . hydroCHLOROthiazide (HYDRODiuril) 25 mg tablet TAKE 1 TABLET BY MOUTH EVERY DAY 90 tablet 2   . insulin glargine (Basaglar KwikPen U-100 Insulin) 100 unit/mL (3 mL) injection Inject 35 Units under the skin in the morning. 15 mL 11   . lisinopril 10 mg tablet TAKE 1 TABLET BY  MOUTH EVERY DAY 90 tablet 2   . Ozempic 0.25 mg or 0.5 mg(2 mg/1.5 mL) pen injector INJECT 0.5MG  UNDER SKIN WEEKLY 1 each 5   . [DISCONTINUED] hydroCHLOROthiazide (HYDRODiuril) 25 mg tablet Take 25 mg by mouth in the morning.       No facility-administered encounter medications on file as of 11/14/2021.       ASCVD 10 Year Risk Estimate: The 10-year ASCVD risk score (Arnett DK, et al., 2019) is: 4.2%    Values used to calculate the score:      Age: 46 years      Sex: Female      Is Non-Hispanic African American: Yes      Diabetic: Yes      Tobacco smoker: No      Systolic Blood Pressure: 120 mmHg      Is BP treated: Yes      HDL Cholesterol: 49 mg/dL      Total Cholesterol: 153 mg/dL    ASSESSMENT & PLAN     DIABETES     [name]'s last A1c 7.4 (10/2021) is not at goal of A1c <7% per ADA guidelines, increased from 5.4% (02/2021). Recent home fasting blood glucose readings likely are not at goal of 80 - 130. Patient is not experiencing hypoglycemia. Taking Ozmeic and Basaglar with poor adherence. Encouraged her to  reach out next time if pharmacy cannot fill prescriptions for [place] to help. Will restart medications today, will increase Basaglar slightly to account for restarting Ozempic. Encouraged to minimize nighttime snacking and add in physical activity and ways to include her daughter. Will switch to Lester 3 for newer CGM and smartphone compatabilitiy.    Plan:   1. Start Ozempic 0.25 mg weekly and Basaglar 36 units daily, can increase to 38 units if BGs >200.  2. Scan blood sugars daily  3. Aim to exercise 2x/week, 30 mins and decrease nighttime snacking    Neta Ehlers, PharmD, BCACP, BCPS  11/14/2021  9:55 AM

## 2021-12-07 ENCOUNTER — Other Ambulatory Visit

## 2021-12-12 ENCOUNTER — Other Ambulatory Visit

## 2021-12-12 ENCOUNTER — Encounter (HOSPITAL_BASED_OUTPATIENT_CLINIC_OR_DEPARTMENT_OTHER): Admitting: Internal Medicine

## 2021-12-12 ENCOUNTER — Encounter (HOSPITAL_BASED_OUTPATIENT_CLINIC_OR_DEPARTMENT_OTHER)

## 2021-12-12 MED ORDER — Dexcom platinum transmitter (Dexcom G6 Transmitter) device
3 refills | Status: AC
Start: 2021-12-12 — End: ?

## 2021-12-12 MED ORDER — blood-glucose sensor (Dexcom G6 Sensor) device
11 refills | 30.00000 days | Status: AC
Start: 2021-12-12 — End: ?

## 2021-12-14 ENCOUNTER — Ambulatory Visit (HOSPITAL_BASED_OUTPATIENT_CLINIC_OR_DEPARTMENT_OTHER)

## 2021-12-15 ENCOUNTER — Encounter (HOSPITAL_BASED_OUTPATIENT_CLINIC_OR_DEPARTMENT_OTHER)

## 2021-12-16 ENCOUNTER — Encounter

## 2021-12-16 ENCOUNTER — Encounter (HOSPITAL_BASED_OUTPATIENT_CLINIC_OR_DEPARTMENT_OTHER)

## 2021-12-19 ENCOUNTER — Telehealth
Admit: 2021-12-19 | Discharge: 2021-12-19 | Payer: BLUE CROSS/BLUE SHIELD | Attending: Ambulatory Care | Primary: Internal Medicine

## 2021-12-19 ENCOUNTER — Encounter (HOSPITAL_BASED_OUTPATIENT_CLINIC_OR_DEPARTMENT_OTHER)

## 2021-12-19 ENCOUNTER — Other Ambulatory Visit

## 2021-12-19 ENCOUNTER — Encounter

## 2021-12-19 DIAGNOSIS — Z794 Long term (current) use of insulin: Secondary | ICD-10-CM

## 2021-12-19 NOTE — Progress Notes (Signed)
[facility] [place]    [name] is a 46 y.o. female, for follow up for collaborative management of Type 2 diabetes with [name], MD. [name] also has hypertension and obesity class 1.    Events since last visit:  - Administered 2 doses of Ozempic 0.25 mg weekly, starting 02/02  - Received Dexcom G6 from pharmacy and has box at home, but uncertain if she wants to use this CGM because it is different from [facility] 3, which she had in the past   - Has not done fingersticks, waiting for CGM clarification and education  - She does not think the insulin came with the correct needle because of how the insulin is coming out, still using insulin every day    DIABETES    Current medications: Ozempic 0.25 mg weekly, Basaglar 34 units daily    Meter: not checking  Hypoglycemia: no s/sxs  Testing BG: not checking  Home BG readings: N/A    A1c Trend:   Lab Results   Component Value Date    HGBA1C 7.4 10/13/2021    HGBA1C 5.7 (H) 02/17/2021    HGBA1C 5.6 02/17/2021     Albumin/Creatinine ratio   Date Value Ref Range Status   02/17/2021   Final     Comment:     Unable to calculate due to below or above Analytical Measurement Range.       Preventive Therapy  Health Maintenance Due   Topic Date Due   . Diabetes: Foot Exam  Never done   . Pneumococcal Vaccine: Pediatrics (0 to 5 Years) and At-Risk Patients (6 to 64 Years) (2 - PCV) 01/26/2011   . Influenza Vaccine (1) 07/07/2021     ACEi/ARB: lisinopril 10 mg  ASA: no  Statin: atorvastatin 40 mg    LIFESTYLE    Diet: snacking late at night, improved since starting Ozempic  Beverages: water, no soda  Alcohol:   Activity/Exercise: minimal, [facility] 1-2x/week    Vitals 10/13/2021 10/13/2021 11/14/2021   Systolic 145 120 161   Diastolic 83 80 70   Pulse 89 - 75   Temp 97.6 - 97.6   Height (in) 66 - 65.984   Weight (lb) 213 - 220.4   SpO2 100 - -   BMI (kg/m2) 34.38 kg/m2 - 35.59 kg/m2   BSA (m2) 2.12 m2 - 2.16 m2   VISIT REPORT - - -   Some recent data  might be hidden     MEDICATION MANAGEMENT    Tondra Reierson does not have assistance managing and taking medications.     Medication side effects: After first dose of Ozempic she experienced mild diarrhea and nausea. Lasted 2-3 days, did not affect daily activities and resolved on its own    Medication cost: no issue  Medication adherence: was off Ozempic for 8 months, recently restarted    Medication list, fill history, and drug allergies reviewed with the patient. Patient is a good historian of medications and indications. The following discrepancies were discovered: none    Current medications:   Outpatient Encounter Medications as of 12/19/2021   Medication Sig Dispense Refill   . atorvastatin (Lipitor) 40 mg tablet Take 1 tablet (40 mg) by mouth in the morning. 90 tablet 3   . blood-glucose sensor (Dexcom G6 Sensor) device Use to monitor blood sugar, change every 10 days as instructed 3 each 11   . Dexcom platinum transmitter (Dexcom G6 Transmitter) device Use as instructed to monitor  blood sugars, replace every 90 days as instructed by meter/phone 1 each 3   . hydroCHLOROthiazide (HYDRODiuril) 25 mg tablet TAKE 1 TABLET BY MOUTH EVERY DAY 90 tablet 2   . insulin glargine (Basaglar KwikPen U-100 Insulin) 100 unit/mL (3 mL) injection Inject 36 Units under the skin in the morning. 15 mL 11   . lisinopril 10 mg tablet TAKE 1 TABLET BY MOUTH EVERY DAY 90 tablet 2   . pen needle, diabetic (BD Nano 2nd Gen Pen Needle) 32 gauge x 5/32" needle Use daily to inject insulin 100 each 3   . semaglutide (Ozempic) 0.25 mg or 0.5 mg(2 mg/1.5 mL) pen injector Inject 0.5 mg under the skin 1 (one) time per week. 1 each 5     No facility-administered encounter medications on file as of 12/19/2021.     ASCVD 10 Year Risk Estimate: The 10-year ASCVD risk score (Arnett DK, et al., 2019) is: 5.1%    Values used to calculate the score:      Age: [age] years      Sex: Female      Is Non-Hispanic African American: Yes      Diabetic: Yes       Tobacco smoker: No      Systolic Blood Pressure: 125 mmHg      Is BP treated: Yes      HDL Cholesterol: 49 mg/dL      Total Cholesterol: 153 mg/dL    ASSESSMENT & PLAN     DIABETES     [name]'s last A1c 7.4 (10/2021) is not at goal of A1c <7% per ADA guidelines, increased from 5.4% (02/2021). Since there are no blood glucose readings, cannot assess effect of Ozempic and Basaglar on blood glucose. Based on patient report, likely not experiencing signs or symptoms of hypoglycemia. With no report of s/sx of hypoglycemia and minimal GI side effects from Ozempic, reasonable to increase to 0.5 mg weekly. If she does have signs or symptoms of hypoglycemia or blood glucose <80 mg/dL, I instructed her to call the healthcare team. Dexcom G6 teaching was conducted and patient states she will apply it tonight when she gets home from work, [employer] Clarity link sent to patient. I have not heard of the insulin coming out of the needle incorrectly before, re-assess at next visit. Encouraged to minimize nighttime snacking and add in physical activity.    Plan:   1. Increase Ozempic to 0.5 mg weekly on 03/02  2. Continue Basaglar 36 units daily  3. Assess blood glucose readings when Dexcom G6 applied  4. Aim to exercise 2x/week, 30 mins and decrease nighttime snacking    PCP f/u on 03/23  PharmD in-person visit on 04/20    Burman Nievesobert Jeremy Mclamb, PharmD  PGY1 Pharmacy Resident  Available on [employer]    This real-time, interactive virtual Telehealth encounter was done by video with the patient's verbal consent. Two patient identifiers were used and confirmed. Physical location of the patient: home Patient resides in: [place]  Physical location of the provider: office. Other participants/involvement: none  Total minutes spent: 45 minutes

## 2021-12-21 ENCOUNTER — Encounter (HOSPITAL_BASED_OUTPATIENT_CLINIC_OR_DEPARTMENT_OTHER)

## 2021-12-22 MED ORDER — semaglutide (Ozempic) 0.25 mg or 0.5 mg(2 mg/1.5 mL) pen injector
0.25 | SUBCUTANEOUS | 5 refills | 28.00000 days | Status: AC
Start: 2021-12-22 — End: ?

## 2021-12-30 ENCOUNTER — Encounter

## 2021-12-30 ENCOUNTER — Ambulatory Visit: Payer: BLUE CROSS/BLUE SHIELD | Primary: Internal Medicine

## 2021-12-30 ENCOUNTER — Encounter (HOSPITAL_BASED_OUTPATIENT_CLINIC_OR_DEPARTMENT_OTHER)

## 2022-01-03 ENCOUNTER — Encounter

## 2022-01-04 ENCOUNTER — Encounter (HOSPITAL_BASED_OUTPATIENT_CLINIC_OR_DEPARTMENT_OTHER): Admitting: Internal Medicine

## 2022-01-26 ENCOUNTER — Encounter

## 2022-01-26 ENCOUNTER — Ambulatory Visit (HOSPITAL_BASED_OUTPATIENT_CLINIC_OR_DEPARTMENT_OTHER): Admitting: Internal Medicine

## 2022-02-14 LAB — LAB COLOGUARD® COLON CANCER SCREEN

## 2022-02-14 LAB — COLOGUARD (EXT)

## 2022-02-23 ENCOUNTER — Ambulatory Visit: Admitting: Ambulatory Care

## 2022-02-23 ENCOUNTER — Ambulatory Visit (HOSPITAL_BASED_OUTPATIENT_CLINIC_OR_DEPARTMENT_OTHER): Admitting: Ambulatory Care

## 2022-03-02 ENCOUNTER — Ambulatory Visit (HOSPITAL_BASED_OUTPATIENT_CLINIC_OR_DEPARTMENT_OTHER): Admitting: Ambulatory Care

## 2022-03-29 MED ORDER — semaglutide (Ozempic) 0.25 mg or 0.5 mg(2 mg/1.5 mL) pen injector
0.25 | SUBCUTANEOUS | 5 refills | 28.00000 days | Status: DC
Start: 2022-03-29 — End: 2022-05-17

## 2022-03-29 NOTE — Telephone Encounter (Signed)
Pt picked up #90 of both meds 02/2022. Too soon. Med refill declined. Lucianne Musslaudia Raizy Auzenne, RN, 03/29/2022, 3:41 PM

## 2022-03-29 NOTE — Telephone Encounter (Signed)
Long term management, tolerated well, has an upcoming appointment with PCP.    Derinda Late, RN  03/29/2022  3:37 PM

## 2022-04-20 ENCOUNTER — Ambulatory Visit: Payer: BLUE CROSS/BLUE SHIELD | Attending: Adult Health | Primary: Internal Medicine

## 2022-05-16 ENCOUNTER — Ambulatory Visit
Admit: 2022-05-16 | Discharge: 2022-05-17 | Payer: BLUE CROSS/BLUE SHIELD | Attending: Internal Medicine | Primary: Internal Medicine

## 2022-05-16 DIAGNOSIS — E113299 Type 2 diabetes mellitus with mild nonproliferative diabetic retinopathy without macular edema, unspecified eye: Secondary | ICD-10-CM

## 2022-05-16 DIAGNOSIS — Z1211 Encounter for screening for malignant neoplasm of colon: Secondary | ICD-10-CM

## 2022-05-16 NOTE — Progress Notes (Signed)
[facility] [place]   [address]  [address]  [place] [address]  [phone]     Patient ID:  [name] is a 46 y.o. female who presents for     Subjective    HPI   DM:  Prescription is screwed up so off ozempic again-may be an issue with access to ozempic. She is very frustrated and doesn't want to change to another GLP-1, but her refill has updated  And it looks like it went through.  Eye exam pending  She doesn't l ike the new glucometer they are trying to get her so isnt' checking sugars  Is taking insulin  Agrees to some labs but won't do today   Lab Results   Component Value Date    HGBA1C 7.4 10/13/2021       Had colpo at [facility], "ok" and next f/u is 1 year     Patient Active Problem List   Diagnosis   . Unspecified abnormal cytological findings in specimens from cervix uteri   . 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 Horner's syndrome   . Fatigue   . Bilateral carpal tunnel syndrome   . Closed dislocation of patella   . High cholesterol   . Hypertension   . Hidradenitis   . Marijuana smoker   . Depression, major, in remission (CMS/HCC)      No past surgical history on file.     Family History:  family history includes Diabetes in her mother.     Social History     Tobacco Use   . Smoking status: Former   . Smokeless tobacco: Not on file   Vaping Use   . Vaping Use: Never used   Substance Use Topics   . Alcohol use: Not on file   . Drug use: Yes     Types: Marijuana     Comment: every other day     Allergies   Allergen Reactions   . Penicillin V Potassium Rash     Other reaction(s): rash as child       Current Outpatient Medications   Medication Instructions   . atorvastatin (LIPITOR) 40 mg, oral, Daily   . blood-glucose sensor (Dexcom G6 Sensor) device Use to monitor blood sugar, change every 10 days as instructed   . Dexcom platinum transmitter (Dexcom G6 Transmitter) device Use as instructed to monitor blood  sugars, replace every 90 days as instructed by meter/phone   . hydroCHLOROthiazide (HYDRODiuril) 25 mg tablet TAKE 1 TABLET BY MOUTH EVERY DAY   . insulin glargine (BASAGLAR KWIKPEN U-100 INSULIN) 36 Units, subcutaneous, Every morning   . lisinopril 10 mg tablet TAKE 1 TABLET BY MOUTH EVERY DAY   . Ozempic 0.5 mg, subcutaneous, Weekly   . pen needle, diabetic (BD Nano 2nd Gen Pen Needle) 32 gauge x 5/32" needle Use daily to inject insulin        Objective    BP 123/71   Pulse 80   Temp 37.3 C (99.2 F) (Oral)   Ht 1.676 m   Wt 97.5 kg   SpO2 96%   BMI 34.72 kg/m     Physical Exam   Looks well        Lab Results   Component Value Date    WBC 6.7 02/17/2021    HGB 11.6 02/17/2021    HCT 35.2 02/17/2021    PLT 323 02/17/2021  CHOL 153 02/17/2021    TRIG 93 02/17/2021    HDL 49 02/17/2021    LDLCALC 85 02/17/2021    ALT 22 02/17/2021    AST 21 02/17/2021    NA 140 02/17/2021    K 3.4 (L) 02/17/2021    CL 104 02/17/2021    CREATININE 0.98 02/17/2021    BUN 17 02/17/2021    EGFR 73 02/17/2021    CO2 26 02/17/2021    TSH 0.77 02/17/2021    HGBA1C 7.4 10/13/2021    MICROALBUR <1.0 02/17/2021          Assessment/Plan      Symphony was seen today for follow-up.  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    HGBA1C 7.4 10/13/2021    LDLCALC 85 02/17/2021       Marijuana smo ker  Assessment & Plan:  Advised d/c  Depression, major, in remission (CMS/HCC)  Assessment & Plan:  Off meds, in remission  Horner's syndrome  Assessment & Plan:  F/b [facility]; would like to be seen here-refer   Primary hypertension  Assessment & Plan:  Stable on Lisinopril 10/hctz 25; check bmp; pt understands she is not to get pregnant on ACE (declines further contraception, does n ot want IUD, not currenlty SA)  High cholesterol  Assessment & Plan:  Restarted atorvastatin/has not currenlty been adherent (last LDL NOT on atorvastatin now back on )         Lab Results   Component Value Date    LDLCALC 85 02/17/2021       Unspecified abnormal cytological findings in specimens from cervix uteri  LSIL 2/22 pap, 4/22 benign colpo at [facility]. 1 year cotest f/u advised --done with recent colpo (around 04/2022//will need results) told 1 year f/u cotest advised                       Last CE: 5/15; 1/17; 4/18 ; 12/19; 9/21 ; 12/22  Pap: /10 ascus; colpo neg; 12/11 colpo lsil; 6/12 ascus, hpv not done; 4/13 pap neg, hr hpv pos, colpo neg; 5/14 pap/hpv neg;  pap May 2015-neg pap, did not receive hpv results; 2/22 LSIL , hpv pos, colpo benign; 1 year cotest advised 2023 pap abnormal   colpo 2023/recent, "negative" per pt; 1 year cotest advised (03/2023)  Mammo:ni; 8/17 on mammo van; order  Colo:order cologuard   BMD:ni  Eye care:due 4/20; has appt 9/21; scheduled/upcoming   Dental: utd  Choleseterol         Lab Results   Component Value Date    LDLCALC 85 02/17/2021       Risk Score Cholesterol: on high intensity statin  Tdap: 2012,2018  MMR: vax 1978, 1994  Varicella:kid  Hep B:3 vax  Pvax:2011  Flu 2015, 2016;2019; got  2022  Zoster:ni  HPV:ni  covid vax x 3      A total of 30  minutes was spent on this encounter, reviewing previous records, performing the exam, counseling/education of the patient regarding treatment plans and clinical documentation into the EMR.          Follow up in about 3 months (around 08/16/2022) for 2 months with me .

## 2022-05-17 MED ORDER — semaglutide (Ozempic) 0.25 mg or 0.5 mg (2 mg/3 mL) pen-injector
0.25 | SUBCUTANEOUS | 11 refills | 28.00000 days | Status: DC
Start: 2022-05-17 — End: 2022-06-09
  Filled 2022-05-26: qty 1, 28d supply, fill #0

## 2022-05-17 MED ORDER — pen needle, diabetic (BD Nano 2nd Gen Pen Needle) 32 gauge x 5/32" needle
32 | 3 refills | 90.00000 days | Status: DC
Start: 2022-05-17 — End: 2022-06-09
  Filled 2022-05-29: qty 100, 30d supply, fill #0

## 2022-05-17 MED ORDER — atorvastatin (Lipitor) 40 mg tablet
40 | ORAL_TABLET | Freq: Every day | ORAL | 3 refills | 90.00000 days | Status: DC
Start: 2022-05-17 — End: 2022-06-09
  Filled 2022-05-29: qty 30, 30d supply, fill #0

## 2022-05-17 MED ORDER — semaglutide (Ozempic) 0.25 mg or 0.5 mg(2 mg/1.5 mL) pen injector
0.25 | SUBCUTANEOUS | 5 refills | 28.00000 days | Status: DC
Start: 2022-05-17 — End: 2022-05-17

## 2022-05-17 MED ORDER — hydroCHLOROthiazide (HYDRODiuril) 25 mg tablet
25 | ORAL_TABLET | Freq: Every day | ORAL | 2 refills | 90.00000 days | Status: DC
Start: 2022-05-17 — End: 2022-06-09

## 2022-05-17 MED ORDER — insulin glargine (Basaglar KwikPen U-100 Insulin) 100 unit/mL (3 mL) injection
100 | Freq: Every morning | SUBCUTANEOUS | 11 refills | 60.00000 days | Status: DC
Start: 2022-05-17 — End: 2022-06-09

## 2022-05-17 NOTE — Progress Notes (Signed)
Spoke with patient. Has had difficulty obtaining Ozempic. May be due to [facility] switch to 3mL pen, will update rx. Will reinitate taper with Ozempic 0.25 mg x 4 weeks, then increase to 0.5 mg weekly.     She also dislikes how she can no longer get 90 day supply due to insurance restriction as she often has refill gaps with the 30 day supplies. Discussed options, will switch to [facility] with refill management to help maintain adherence. Will send all rxs to [facility].     Lisinopril removed from med list from now as she has not been taking it, BP was well controlled at yesterday's visit. May need to switch from HCTZ to ACEi at future visit for renal protection, will wait until future visit.    She did not try the Dexcom setup but dislikes it as she thinks it will be bigger and too much. She will call [employer] today to see if she can switch back to [employer]. She will let me know what they say and we will plan for in-person teaching if needed, or sending glucometer supplies at that time.     Will follow up within the week.     Neta Ehlers, PharmD, BCACP, BCPS  05/17/2022  3:07 PM

## 2022-05-19 NOTE — Telephone Encounter (Signed)
Tried to call patient to check in on Dexcom vs Libre sensors and how her discussion with her insurance went. May need to schedule appt with me for [facility] teaching or send glucometer supplies as discussed with her on Wednesday.     Also asked her to call back  Specialty as they have her Ozempic ready but waiting on her to setup delivery. No answer, LVM. Call back numbers provided.    Neta Ehlers, PharmD, BCACP, BCPS  05/19/2022  11:39 AM

## 2022-05-26 ENCOUNTER — Ambulatory Visit: Payer: BLUE CROSS/BLUE SHIELD | Attending: Internal Medicine | Primary: Internal Medicine

## 2022-05-26 NOTE — Progress Notes (Signed)
Specialty Pharmacy Refill Coordination Note     Elvira Langston is a 46 y.o. female contacted today regarding refills of her specialty medication(s).    Reviewed and verified with patient:                Specialty medication(s) and dose(s) confirmed: yes  Changes to medications: n/a  Changes to insurance: n/a    Refill Questions    Flowsheet Row Most Recent Value   Patient Contact Attempts Patient reached   HIPPA Verified? Yes   Specialty Clinics --  [[facility]]   Changes to allergies? No   Changes to medications? No   New conditions since last clinic visit No   Unplanned office visit, urgent care, ED, or hospital admission in the last 4 weeks  No   How does patient/caregiver feel medication is working? Good   Financial problems or insurance changes  No          Delivery Questions    Flowsheet Row Most Recent Value   Delivery method FedEx   Delivery address correct? Yes   Delivery phone number [phone]   Preferred delivery time? Anytime   Number of medications in delivery 3   Is there any medication that is due not being filled? No   Supplies needed? Alcohol swabs   Cooler needed? Yes   Do any medications need mixed or dated? No   Copay form of payment Credit card on file   Questions or concerns for the pharmacist? No   Are any medications first time fills? No   Tracking number for delivery [id number]   Shipment status Delivery complete, Cooler packed                 Follow-up: 28 day(s)     Merci Walthers, CPhT  Specialty Pharmacy Technician

## 2022-06-01 NOTE — Telephone Encounter (Signed)
No answer, LVM to check in to see what she is thinking about  Dexcom teaching vs what insurance said about Josephine IgoLibre coverage and if she needs glucometer supplies. Call back number provided. Asked her to call back or send mychart message of best time of day to reach her.    Neta EhlersStephanie Hitoshi Werts, PharmD, BCACP, BCPS  06/01/2022  4:43 PM

## 2022-06-09 MED ORDER — pen needle, diabetic (BD Nano 2nd Gen Pen Needle) 32 gauge x 5/32" needle
32 | 3 refills | 90.00000 days | Status: AC
Start: 2022-06-09 — End: ?

## 2022-06-09 MED ORDER — atorvastatin (Lipitor) 40 mg tablet
40 | ORAL_TABLET | Freq: Every day | ORAL | 3 refills | 90.00000 days | Status: AC
Start: 2022-06-09 — End: ?

## 2022-06-09 MED ORDER — blood-glucose sensor (Dexcom G7 Sensor) device
11 refills | 30.00000 days | Status: AC
Start: 2022-06-09 — End: ?

## 2022-06-09 MED ORDER — semaglutide (Ozempic) 0.25 mg or 0.5 mg (2 mg/3 mL) pen-injector
0.25 | SUBCUTANEOUS | 11 refills | 28.00000 days | Status: AC
Start: 2022-06-09 — End: 2023-07-07

## 2022-06-09 MED ORDER — insulin glargine (Basaglar KwikPen U-100 Insulin) 100 unit/mL (3 mL) injection
100 | Freq: Every morning | SUBCUTANEOUS | 11 refills | 60.00000 days | Status: AC
Start: 2022-06-09 — End: ?

## 2022-06-09 MED ORDER — hydroCHLOROthiazide (HYDRODiuril) 25 mg tablet
25 | ORAL_TABLET | Freq: Every day | ORAL | 2 refills | 90.00000 days | Status: AC
Start: 2022-06-09 — End: ?

## 2022-06-09 NOTE — Patient Instructions (Signed)
Spoke with patient, had issue filling through [facility] as her FSA card does not have security code on it. She tried calling the [employer] company and they are unable to help her at this time. Will revisit once she gets new FSA card. For now, [facility]. Also discussed switching to Dexcom G7 as Heidi IgoLibre is not covered. She is amenable to this plan, will come in on Tuesday for teaching after her mammogram. She agrees to [facility]com [facility] before her appt.     Heidi EhlersStephanie Stormi [name], PharmD, BCACP, BCPS  06/09/2022  2:15 PM

## 2022-06-13 ENCOUNTER — Ambulatory Visit: Admit: 2022-06-13 | Payer: BLUE CROSS/BLUE SHIELD | Attending: Ambulatory Care | Primary: Internal Medicine

## 2022-06-13 ENCOUNTER — Inpatient Hospital Stay: Admit: 2022-06-13 | Payer: BLUE CROSS/BLUE SHIELD | Primary: Internal Medicine

## 2022-06-13 DIAGNOSIS — Z1239 Encounter for other screening for malignant neoplasm of breast: Secondary | ICD-10-CM

## 2022-06-13 DIAGNOSIS — Z1231 Encounter for screening mammogram for malignant neoplasm of breast: Secondary | ICD-10-CM

## 2022-07-27 ENCOUNTER — Ambulatory Visit: Payer: BLUE CROSS/BLUE SHIELD | Attending: Internal Medicine | Primary: Internal Medicine

## 2022-08-16 ENCOUNTER — Ambulatory Visit: Payer: BLUE CROSS/BLUE SHIELD | Attending: Adult Health | Primary: Internal Medicine

## 2022-08-19 NOTE — Telephone Encounter (Signed)
Called patient to help schedule an appointment for Annual PE (patient is due for f/u now and PE after 10/14/2022). Unable to reach patient, left a voicemail message to call us back.    Please note, patient is due for a1c, microalbumin urine test, diabetic eye exam and colorectal cancer screening (Cologuard - Unsuccessful Attempt 02/09/22) in 2023.

## 2022-10-06 NOTE — Progress Notes (Signed)
Reordering cologuard

## 2022-10-07 NOTE — Telephone Encounter (Signed)
Called patient to help schedule an appointment for Annual PE (patient is due for f/u now and PE after 10/14/2022). Unable to reach patient, left a voicemail message to call us back. Please note, patient is due for a1c, kidney health eval labs, diabetic eye exam and colorectal cancer screening (Cologuard - Unsuccessful Attempt 02/09/22) in 2023.

## 2022-11-19 NOTE — Progress Notes (Deleted)
New Patient Note  RE: Pamela Farley MRN: [id number] DOB: 11/24/1975 Date of Office Visit: 11/20/2022  Consult requested by: Milinda Pointer, MD Primary care provider: Patient, No Pcp Per  Chief Complaint: No chief complaint on file.  History of Present Illness: I had the pleasure of seeing Pamela Farley for initial evaluation at the [facility] on 11/19/2022. She is a 47 y.o. female, who is referred here by Patient, No Pcp Per for the evaluation of asthma.  She reports symptoms of *** chest tightness, shortness of breath, coughing, wheezing, nocturnal awakenings for *** years. Current medications include *** which help. She reports *** using aerochamber with inhalers. She tried the following inhalers: ***. Main triggers are ***allergies, infections, weather changes, smoke, exercise, pet exposure. In the last month, frequency of symptoms: ***x/week. Frequency of nocturnal symptoms: ***x/month. Frequency of SABA use: ***x/week. Interference with physical activity: ***. Sleep is ***disturbed. In the last 12 months, emergency room visits/urgent care visits/doctor office visits or hospitalizations due to respiratory issues: ***. In the last 12 months, oral steroids courses: ***. Lifetime history of hospitalization for respiratory issues: ***. Prior intubations: ***. Asthma was diagnosed at age *** by ***. History of pneumonia: ***. She was evaluated by allergist ***pulmonologist in the past. Smoking exposure: ***. Up to date with flu vaccine: ***. Up to date with pneumonia vaccine: ***. Up to date with COVID-19 vaccine: ***. Prior Covid-19 infection: ***. History of reflux: ***.  Assessment and Plan: Pamela Farley is a 47 y.o. female with: No problem-specific Assessment & Plan notes found for this encounter.  No follow-ups on file.  No orders of the defined types were placed in this encounter.  Lab Orders  No laboratory test(s) ordered today    Other allergy screening: Asthma: {Blank  single:19197::"yes","no"} Rhino conjunctivitis: {Blank single:19197::"yes","no"} Food allergy: {Blank single:19197::"yes","no"} Medication allergy: {Blank single:19197::"yes","no"} Hymenoptera allergy: {Blank single:19197::"yes","no"} Urticaria: {Blank single:19197::"yes","no"} Eczema:{Blank single:19197::"yes","no"} History of recurrent infections suggestive of immunodeficency: {Blank single:19197::"yes","no"}  Diagnostics: Spirometry:  Tracings reviewed. Her effort: {Blank single:19197::"Good reproducible efforts.","It was hard to get consistent efforts and there is a question as to whether this reflects a maximal maneuver.","Poor effort, data can not be interpreted."} FVC: ***L FEV1: ***L, ***% predicted FEV1/FVC ratio: ***% Interpretation: {Blank single:19197::"Spirometry consistent with mild obstructive disease","Spirometry consistent with moderate obstructive disease","Spirometry consistent with severe obstructive disease","Spirometry consistent with possible restrictive disease","Spirometry consistent with mixed obstructive and restrictive disease","Spirometry uninterpretable due to technique","Spirometry consistent with normal pattern","No overt abnormalities noted given today's efforts"}.  Please see scanned spirometry results for details.  Skin Testing: {Blank single:19197::"Select foods","Environmental allergy panel","Environmental allergy panel and select foods","Food allergy panel","None","Deferred due to recent antihistamines use"}. *** Results discussed with patient/family.   Past Medical History: There are no problems to display for this patient.  No past medical history on file. Past Surgical History: No past surgical history on file. Medication List:  Current Outpatient Medications  Medication Sig Dispense Refill  . megestrol (MEGACE) 20 MG tablet Take 1 tablet (20 mg total) by mouth 2 (two) times daily. 60 tablet 0   No current facility-administered medications  for this visit.   Allergies: No Known Allergies Social History: Social History   Socioeconomic History  . Marital status: Unknown    Spouse name: Not on file  . Number of children: Not on file  . Years of education: Not on file  . Highest education level: Not on file  Occupational History  . Not on file  Tobacco Use  . Smoking status:  Every Day  . Smokeless tobacco: Not on file  Substance and Sexual Activity  . Alcohol use: Not on file  . Drug use: Not on file  . Sexual activity: Not on file  Other Topics Concern  . Not on file  Social History Narrative  . Not on file   Social Determinants of Health   Financial Resource Strain: Not on file  Food Insecurity: Not on file  Transportation Needs: Not on file  Physical Activity: Not on file  Stress: Not on file  Social Connections: Not on file   Lives in a ***. Smoking: *** Occupation: ***  Environmental HistoryFreight forwarder in the house: Estate agent in the family room: {Blank single:19197::"yes","no"} Carpet in the bedroom: {Blank single:19197::"yes","no"} Heating: {Blank single:19197::"electric","gas","heat pump"} Cooling: {Blank single:19197::"central","window","heat pump"} Pet: {Blank single:19197::"yes ***","no"}  Family History: No family history on file. Problem                               Relation Asthma                                   *** Eczema                                *** Food allergy                          *** Allergic rhino conjunctivitis     ***  Review of Systems  Constitutional:  Negative for appetite change, chills, fever and unexpected weight change.  HENT:  Negative for congestion and rhinorrhea.   Eyes:  Negative for itching.  Respiratory:  Negative for cough, chest tightness, shortness of breath and wheezing.   Cardiovascular:  Negative for chest pain.  Gastrointestinal:  Negative for abdominal pain.  Genitourinary:  Negative for difficulty  urinating.  Skin:  Negative for rash.  Neurological:  Negative for headaches.   Objective: There were no vitals taken for this visit. There is no height or weight on file to calculate BMI. Physical Exam Vitals and nursing note reviewed.  Constitutional:      Appearance: Normal appearance. She is well-developed.  HENT:     Head: Normocephalic and atraumatic.     Right Ear: Tympanic membrane and external ear normal.     Left Ear: Tympanic membrane and external ear normal.     Nose: Nose normal.     Mouth/Throat:     Mouth: Mucous membranes are moist.     Pharynx: Oropharynx is clear.  Eyes:     Conjunctiva/sclera: Conjunctivae normal.  Cardiovascular:     Rate and Rhythm: Normal rate and regular rhythm.     Heart sounds: Normal heart sounds. No murmur heard.    No friction rub. No gallop.  Pulmonary:     Effort: Pulmonary effort is normal.     Breath sounds: Normal breath sounds. No wheezing, rhonchi or rales.  Musculoskeletal:     Cervical back: Neck supple.  Skin:    General: Skin is warm.     Findings: No rash.  Neurological:     Mental Status: She is alert and oriented to person, place, and time.  Psychiatric:        Behavior: Behavior normal.  The plan was reviewed  with the patient/family, and all questions/concerned were addressed.  It was my pleasure to see Pamela Farley today and participate in her care. Please feel free to contact me with any questions or concerns.  Sincerely,  Rexene Alberts, DO Allergy & Immunology  [facility] office: [place] office: [phone]

## 2022-11-20 ENCOUNTER — Ambulatory Visit: Payer: Medicaid Other | Admitting: Allergy

## 2022-12-15 NOTE — Telephone Encounter (Signed)
Outreach for annual with me or team  Westly Pam, MD

## 2022-12-19 ENCOUNTER — Ambulatory Visit: Payer: Medicaid Other | Admitting: Allergy & Immunology

## 2022-12-22 NOTE — Telephone Encounter (Signed)
Called pt. LMOM for pt to call back to schedule appointment.

## 2023-01-08 NOTE — Telephone Encounter (Signed)
The patient has previously scheduled for an appointment. Thank you Zulma Court- [facility]-Team

## 2023-01-16 ENCOUNTER — Ambulatory Visit
Admit: 2023-01-16 | Discharge: 2023-01-16 | Payer: BLUE CROSS/BLUE SHIELD | Attending: Internal Medicine | Primary: Internal Medicine

## 2023-01-16 DIAGNOSIS — F32A Depression, unspecified: Secondary | ICD-10-CM

## 2023-01-16 LAB — POCT HEMOGLOBIN A1C: POCT Hemoglobin A1C: 12.3 % — ABNORMAL HIGH (ref <=5.7)

## 2023-01-16 MED ORDER — atorvastatin (Lipitor) 40 mg tablet
40 | ORAL_TABLET | Freq: Every day | ORAL | 3 refills | 90.00000 days | Status: DC
Start: 2023-01-16 — End: 2023-03-05

## 2023-01-16 MED ORDER — sertraline (Zoloft) 25 mg tablet
25 | ORAL_TABLET | Freq: Every day | ORAL | 1 refills | Status: DC
Start: 2023-01-16 — End: 2023-02-08

## 2023-01-16 MED ORDER — insulin glargine (Basaglar KwikPen U-100 Insulin) 100 unit/mL (3 mL) injection
100 | Freq: Every morning | SUBCUTANEOUS | 11 refills | 60.00000 days | Status: DC
Start: 2023-01-16 — End: 2023-02-15

## 2023-01-16 MED ORDER — tirzepatide (Mounjaro) 2.5 mg/0.5 mL pen injector
2.5 | SUBCUTANEOUS | 3 refills | 28.00000 days | Status: DC
Start: 2023-01-16 — End: 2023-03-05
  Filled 2023-01-16: qty 2, 28d supply, fill #0

## 2023-01-16 MED ORDER — semaglutide (Ozempic) 0.25 mg or 0.5 mg (2 mg/3 mL) pen-injector
0.25 | SUBCUTANEOUS | 11 refills | 28.00000 days | Status: DC
Start: 2023-01-16 — End: 2023-01-16

## 2023-01-16 MED ORDER — hydroCHLOROthiazide (HYDRODiuril) 25 mg tablet
25 | ORAL_TABLET | Freq: Every day | ORAL | 2 refills | 90.00000 days | Status: DC
Start: 2023-01-16 — End: 2023-03-05

## 2023-01-16 NOTE — Progress Notes (Signed)
Saw patient this afternoon after PCP appt.   Discussed starting Mounjaro instead of Ozempic for greater glycemic and weight loss ability. Patient education on Mounjaro dosing, indication, storage, side effects, and administration provided. Copay card information provided.    Confirmed that Dexcom sensors are still covered for her, encouraged her to restart for monitoring.    Encouraged to consider therapy as she mentioned her and Dr Ulice Brilliant had discussed it today.     Has PCP f/u appt 4/11. Offered her PharmD f/u, she agreed to video visit in 6 weeks (4/29).    Theora Gianotti, PharmD, BCACP, BCPS  01/16/2023  4:01 PM

## 2023-01-16 NOTE — Progress Notes (Signed)
[facility] [place]   [address]  [place] [address]  [phone]     Patient ID:  [name] is a 47 y.o. female who presents for f/u MMP     Subjective    HPI     [name] is doing well, may need some help with studying, goes to school at the [employer]    "Everything is crazy" worried about finances  Single parent  Doesn't qualify for food help due to income  Has stopped checking her sugars-he doesn't have money for supplies and also motivation  Stopped ozempic due to insurance issues  Maintained on same 36 units of basaglar  No lows   Has come off LIsinopril as well but kept off with negative microalbumin//is due now for microalbumin  Also ran out of atorvastatin. Will do labs today but will be off atorvastatin         DM  Lab Results   Component Value Date    HGBA1C 12.3 (H) 01/16/2023     Lab Results   Component Value Date    HGBA1C 12.3 (H) 01/16/2023     Lab Results   Component Value Date    MICROALBCREA  02/17/2021      Comment:      Unable to calculate due to below or above Analytical Measurement Range.         Weights slightly up from the summer       Wt Readings from Last 6 Encounters:   01/16/23 96 kg   06/13/22 94.8 kg   05/16/22 97.5 kg   11/14/21 100 kg   10/13/21 96.6 kg   02/17/21 89.9 kg     Lack of motivation and fatigue -this has been a longstanding issue for [name], she did very well when she was pregnant, and motivated but now she also feels lack of motivation taking care of her health. She is ambivalent about therapy but thinks felt better on prior Zoloft       PHQ 9 is 4  GAD is 5    ROS: negative other than above     Patient Active Problem List   Diagnosis   . Unspecified abnormal cytological findings in specimens from cervix uteri   . 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 Horner's syndrome   . Fatigue   . Bilateral carpal tunnel syndrome   . Closed dislocation of patella   . High  cholesterol   . Hypertension   . Hidradenitis   . Marijuana smoker   . Depression, major, in remission (CMS-HCC)      No past surgical history on file.     Family History:  family history includes Diabetes in her mother.     Social History     Tobacco Use   . Smoking status: Former   . Smokeless tobacco: Not on file   Vaping Use   . Vaping Use: Never used   Substance Use Topics   . Alcohol use: Not on file   . Drug use: Yes     Types: Marijuana     Comment: every other day     Allergies   Allergen Reactions   . Penicillin V Potassium Rash     Other reaction(s): rash as child       Current Outpatient Medications   Medication Instructions   . atorvastatin (LIPITOR) 40 mg, oral, Daily   . Basaglar  KwikPen U-100 Insulin 36 Units, subcutaneous, Every morning   . blood-glucose sensor (Dexcom G7 Sensor) device Use to scan blood sugars. Change sensor every 10 days.   . hydroCHLOROthiazide (HYDRODIURIL) 25 mg, oral, Daily   . Mounjaro 2.5 mg, subcutaneous, Every 7 days   . pen needle, diabetic (BD Nano 2nd Gen Pen Needle) 32 gauge x 5/32" needle Use daily to inject insulin   . sertraline (ZOLOFT) 25 mg, oral, Daily        Objective    BP 115/74   Pulse 88   Temp 36.1 C (96.9 F) (Temporal)   Ht 1.676 m   Wt 96 kg   SpO2 100%   BMI 34.15 kg/m     Physical Exam     NAD  No LAN  Chest CTA  CV RRR no murmur  Abd soft, non tender  No edema       Lab Results   Component Value Date    WBC 7.5 01/16/2023    HGB 11.9 01/16/2023    HCT 36.0 01/16/2023    PLT 329 01/16/2023    CHOL 153 02/17/2021    TRIG 93 02/17/2021    HDL 49 02/17/2021    LDLCALC 85 02/17/2021    ALT 22 02/17/2021    AST 21 02/17/2021    NA 140 02/17/2021    K 3.4 (L) 02/17/2021    CL 104 02/17/2021    CREATININE 0.98 02/17/2021    BUN 17 02/17/2021    EGFR 73 02/17/2021    CO2 26 02/17/2021    TSH 0.77 02/17/2021    HGBA1C 12.3 (H) 01/16/2023    MICROALBUR <1.0 02/17/2021          Assessment/Plan      Astryd was seen today for follow-up.  Mild depression  -      sertraline (Zoloft) 25 mg tablet; Take 1 tablet (25 mg) by mouth once daily.  High cholesterol  -     atorvastatin (Lipitor) 40 mg tablet; Take 1 tablet (40 mg) by mouth once daily.  Hypertension, unspecified type  -     hydroCHLOROthiazide (HYDRODiuril) 25 mg tablet; Take 1 tablet (25 mg) by mouth once daily.  Type 2 diabetes mellitus with mild nonproliferative retinopathy without macular edema, with long-term current use of insulin, unspecified laterality (CMS-HCC) (HHS-HCC)  -     insulin glargine (Basaglar KwikPen U-100 Insulin) 100 unit/mL (3 mL) injection; Inject 36 Units under the skin in the morning.  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    HGBA1C 7.4 10/13/2021    LDLCALC 85 02/17/2021       Marijuana smo ker  Assessment & Plan:  Advised d/c, occasional use     Horner's syndrome  Assessment & Plan:  F/b [facility] eye;   Primary hypertension  Assessment & Plan:  Stable on hctz 25; check bmp;    High cholesterol  Assessment & Plan:  Restartedatorvastatin/has not currenlty been adherent . She will restart       Unspecified abnormal cytological findings in specimens from cervix uteri  LSIL 2/22 pap, 4/22 benign colpo at [facility]. 1 year cotest f/u advised--done with recent colpo (around 04/2022//will need results) told 1 year f/u cotest advised ; She will do at [facility] today but she declines     fatigue-differential is large, start with labs, treating lack of motivation       mild depression/mild anxiety/lack of motivation  Ambivalent about therapy, discussed synchronous he will consider   Prior zoloft helpful , will restart 25 mg           Last CE: 5/15; 1/17; 4/18 ; 12/19; 9/21; 12/22  Pap: /10 ascus; colpo neg; 12/11 colpo lsil; 6/12 ascus, hpv not done; 4/13 pap neg, hr hpv pos, colpo neg; 5/14 pap/hpv neg; pap May 2015-neg pap, did not receive hpv results; 2/22 LSIL , hpv pos, colpo benign; 1 year cotest advised 2023 pap abnormal   colpo 2023/recent, "negative" per pt; 1 year cotest advised (03/2023) ; she will set up     Mammo: 06/2022   Colo:order cologuard/was not complete; has new one and hasn't done it;   BMD:ni  Eye care DUE, does at [place]: utd  Choleseterol        Lab Results   Component Value Date    [place] 85 02/17/2021       Risk Score Cholesterol: on high intensity statin  Tdap: 2012,2018  MMR: vax 1978, 1994  Varicella:kid  Hep B:3 vax  Pvax:2011  Flu 2015, 2016;2019; got 2022  Zoster:ni  HPV:ni  covid vax x3      A total of 40 minutes was spent on this encounter, reviewing previous records, performing the exam, counseling/education of the patient regarding treatment plans and clinical documentation into the EMR.            Follow up in about 4 weeks (around 02/13/2023) for 4/11 at 8 am please double book for 40 min slot .

## 2023-01-16 NOTE — Patient Instructions (Addendum)
Make GYN appt  Make eye appt   Call cologuard to see how to mail back (I think they will come pick it up)

## 2023-01-17 LAB — CBC
Hct: 36 % (ref 34.0–46.6)
Hgb: 11.9 g/dL (ref 11.1–15.9)
MCH: 29.8 pg (ref 26.6–33.0)
MCHC: 33.1 g/dL (ref 31.5–35.7)
MCV: 90 fL (ref 79–97)
Platelets: 329 10*3/uL (ref 150–450)
RBC: 3.99 x10E6/uL (ref 3.77–5.28)
RDW: 13.8 % (ref 11.7–15.4)
WBC: 7.5 10*3/uL (ref 3.4–10.8)

## 2023-01-17 LAB — LIPID PANEL
Cholesterol: 144 mg/dL (ref 100–199)
HDL Cholesterol: 39 mg/dL — ABNORMAL LOW (ref >39)
LDLc Calc (NIH): 89 mg/dL (ref 0–99)
Non-HDL Chol: 105 mg/dL (ref 0–129)
Triglycerides: 82 mg/dL (ref 0–149)
VLDLc Calc: 16 mg/dL (ref 5–40)

## 2023-01-17 LAB — COMPREHENSIVE METABOLIC PANEL
A/G Ratio: 1.6 (ref 1.2–2.2)
ALT: 21 IU/L (ref 0–32)
AST: 21 IU/L (ref 0–40)
Albumin: 3.9 g/dL (ref 3.9–4.9)
Alk Phosphatase: 124 IU/L — ABNORMAL HIGH (ref 44–121)
Anion Gap: 17 mmol/L (ref 10.0–18.0)
BUN/Creat Ratio: 13 (ref 9–23)
BUN: 12 mg/dL (ref 6–24)
Bili Total: 0.2 mg/dL (ref 0.0–1.2)
Calcium: 9.3 mg/dL (ref 8.7–10.2)
Carbon Dioxide: 22 mmol/L (ref 20–29)
Chloride: 104 mmol/L (ref 96–106)
Creat: 0.89 mg/dL (ref 0.57–1.00)
Globulin Total: 2.5 g/dL (ref 1.5–4.5)
Glucose: 179 mg/dL — ABNORMAL HIGH (ref 70–99)
Potassium: 4.3 mmol/L (ref 3.5–5.2)
Protein Total: 6.4 g/dL (ref 6.0–8.5)
Sodium: 143 mmol/L (ref 134–144)
eGFR: 80 mL/min/{1.73_m2} (ref >59)

## 2023-01-17 LAB — ALBUMIN, URINE, RANDOM (INCLUDING CREATININE)
Alb/Creat Ratio: <4 mg/g{creat} (ref 0–29)
Albumin Ur: <3 ug/mL
Creat Ur: 67.4 mg/dL

## 2023-01-17 LAB — TSH WITH REFLEX: TSH: 1.1 u[IU]/mL (ref 0.450–4.500)

## 2023-01-17 LAB — VITAMIN D 25 HYDROXY: Vitamin D, 25-Hydroxy: 11.8 ng/mL — ABNORMAL LOW (ref 30.0–100.0)

## 2023-01-29 MED ORDER — ergocalciferol, vitamin D2, (Vitamin D-2) 1.25 MG (50000 UT) capsule
1.25 | ORAL_CAPSULE | ORAL | 3 refills | 28.00000 days | Status: DC
Start: 2023-01-29 — End: 2023-03-05

## 2023-01-29 NOTE — Telephone Encounter (Signed)
Low Vit D  Called to discuss labs and rx  [facility] Westly Pam, MD

## 2023-02-08 NOTE — Telephone Encounter (Signed)
sertraline (Zoloft) 25 mg tablet Take 1 tablet (25 mg) by mouth once daily.     New med initially dispensed on 01/16/23 #30 x 1. Has a f/u appt on 02/15/23.  Pharmacy comment: REQUEST FOR 90 DAYS PRESCRIPTION. DX Code Needed.    OK to refill for 90 days or wait until 02/15/23 f/u appt? Please respond to the 6B refill pool if needed.

## 2023-02-09 MED FILL — MOUNJARO 2.5 MG/0.5 ML SUBCUTANEOUS PEN INJECTOR: 2.5 2.5 mg/0.5 mL | SUBCUTANEOUS | 28 days supply | Qty: 2 | Fill #1 | Status: CP

## 2023-02-15 ENCOUNTER — Ambulatory Visit: Admit: 2023-02-15 | Payer: BLUE CROSS/BLUE SHIELD | Attending: Internal Medicine | Primary: Internal Medicine

## 2023-02-15 DIAGNOSIS — R87619 Unspecified abnormal cytological findings in specimens from cervix uteri: Secondary | ICD-10-CM

## 2023-02-15 MED ORDER — insulin glargine (Basaglar KwikPen U-100 Insulin) 100 unit/mL (3 mL) injection
100 | Freq: Every morning | SUBCUTANEOUS | 11 refills | 60.00000 days | Status: DC
Start: 2023-02-15 — End: 2023-03-05

## 2023-02-15 NOTE — Telephone Encounter (Signed)
Attempted to call patient.  No answer and mail box full.    Lavone Nian, RN  02/15/2023  4:02 PM

## 2023-02-15 NOTE — Progress Notes (Signed)
[facility] [place]   [address]  [address]  [place] [address]  [phone]     Patient ID:  [name] is a 47 y.o. female who presents for telehealth follow up     Subjective    HPI     Has had a cold x 2 days  "we are all sick over here"  [name] had an ear infection  Has not done home test  No sob     DM  Has not been checking sugars  Supposed to meet w [name] to get CGM, doesn't have it on now; has appt with [name] 03/05/23  Mounjaro 2.5 mg every 7 days  Every now and then not sure if gets a low ("overheated" )    Mild depression, anxiety  On zoloft 25 mg  Feels less anxious, overwhelmed.   Has been motivated to get on top of her health    Patient Health Questionnaire-9 Score: 7   Interpretation: Positive screening.     Follow-up & Interventions: Patient currently receiving treatment for depression        Patient Active Problem List   Diagnosis   . Unspecified abnormal cytological findings in specimens from cervix uteri   . 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 Horner's syndrome   . Fatigue   . Bilateral carpal tunnel syndrome   . Closed dislocation of patella   . High cholesterol   . Hypertension   . Hidradenitis   . Marijuana smoker   . Depression, major, in remission (CMS-HCC)      No past surgical history on file.     Family History:  family history includes Diabetes in her mother.     Social History     Tobacco Use   . Smoking status: Former   . Smokeless tobacco: Not on file   Vaping Use   . Vaping Use: Never used   Substance Use Topics   . Alcohol use: Not on file   . Drug use: Yes     Types: Marijuana     Comment: every other day     Allergies   Allergen Reactions   . Penicillin V Potassium Rash     Other reaction(s): rash as child       Current Outpatient Medications   Medication Instructions   . atorvastatin (LIPITOR) 40 mg, oral, Daily   . Basaglar KwikPen U-100 Insulin 40 Units, subcutaneous,  Every morning   . blood-glucose sensor (Dexcom G7 Sensor) device Use to scan blood sugars. Change sensor every 10 days.   . ergocalciferol (vitamin D2) (VITAMIN D-2) 50,000 Units, oral, Weekly   . hydroCHLOROthiazide (HYDRODIURIL) 25 mg, oral, Daily   . Mounjaro 2.5 mg, subcutaneous, Every 7 days   . pen needle, diabetic (BD Nano 2nd Gen Pen Needle) 32 gauge x 5/32" needle Use daily to inject insulin   . sertraline (ZOLOFT) 25 mg, oral, Daily        Objective    There were no vitals taken for this visit.    Physical Exam   Congested       Lab Results   Component Value Date    WBC 7.5 01/16/2023    HGB 11.9 01/16/2023    HCT 36.0 01/16/2023    PLT 329 01/16/2023    CHOL 144 01/16/2023    TRIG 82 01/16/2023    HDL 39 (L) 01/16/2023    LDLCALC  89 01/16/2023    ALT 21 01/16/2023    AST 21 01/16/2023    NA 143 01/16/2023    K 4.3 01/16/2023    CL 104 01/16/2023    CREATININE 0.89 01/16/2023    BUN 12 01/16/2023    EGFR 80 01/16/2023    CO2 22 01/16/2023    TSH 1.100 01/16/2023    HGBA1C 12.3 (H) 01/16/2023    MICROALBUR <3.0 01/16/2023          Assessment/Plan      Abnormal cervical Papanicolaou smear, unspecified abnormal pap finding  -     EXT  Gynecology Referral; Future  Type 2 diabetes mellitus with mild nonproliferative retinopathy without macular edema, with long-term current use of insulin, unspecified laterality (CMS-HCC) (HHS-HCC)  -     insulin glargine (Basaglar KwikPen U-100 Insulin) 100 unit/mL (3 mL) injection; Inject 40 Units under the skin in the morning.      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, with long-term current use of insulin (CMS/HCC)      Uncontrolled now with a1c 12   Due for eye-she will book at [facility] increase to 40 Units ;   Mounjaro 2.5 mg; has not been checking sugars; going to see [name] 4/29 to get CGM so  we can titrate up     NOT on ace, check microalb  NOT on statin, check LDL but restart   Lab Results   Component Value Date    MICROALBCREA <4 01/16/2023    HGBA1C 12.3 (H) 01/16/2023    LDLCALC 89 01/16/2023       Low Vit D now on replacement     Marijuana smo ker  Assessment & Plan:  Advised d/c, occasional use     Horner's syndrome  Assessment & Plan:  F/b [facility]; scheduled at [facility] for May   Primary hypertension  Assessment & Plan:  Stable on hctz 25; ok  bmp;   High cholesterol  Assessment & Plan:  Restartedatorvastatin/has not currenlty been adherent . She will restart although LDL was 89       Unspecified abnormal cytological findings in specimens from cervix uteri  LSIL 2/22 pap, 4/22 benign colpo at [facility]. 1 year cotest f/u advised--done with recent colpo (around 04/2022//will need results) told 1 year f/u cotest advised; She will do at [facility] -she has scheduled will need referral     fatigue-differential is large, start with labs, treating lack of motivation       mild depression/mild anxiety/lack of motivation  Ambivalent about therapy, discussed synchronous he will consider   Prior zoloft helpful , will restart 25 mg           Last CE: 5/15; 1/17; 4/18 ; 12/19; 9/21; 12/22  Pap: /10 ascus; colpo neg; 12/11 colpo lsil; 6/12 ascus, hpv not done; 4/13 pap neg, hr hpv pos, colpo neg; 5/14 pap/hpv neg; pap May 2015-neg pap, did not receive hpv results; 2/22 LSIL , hpv pos, colpo benign; 1 year cotest advised2023 pap abnormal   colpo 2023/recent, "negative" per pt; 1 year cotest advised (03/2023); she will set up     Mammo: 06/2022   Colo:order cologuard/was not complete; has new one and hasn't done it;   BMD:ni  Eye care DUE, does at [facility]   Dental: utd  Choleseterol  Lab Results   Component Value Date    LDLCALC 85 02/17/2021       Risk Score Cholesterol: on high intensity statin  Tdap: 2012,2018  MMR: vax 1978, 1994  Varicella:kid  Hep B:3 vax  Pvax:2011  Flu 2015,  2016;2019; got 2022  Zoster:ni  HPV:ni  covid vax x3            No follow-ups on file.      This real-time, interactive virtual Telehealth encounter was done by video with the patient's verbal consent. Two patient identifiers were used and confirmed. Physical location of the patient: home Patient resides in: [place]  Physical location of the provider: office  Other participants/involvement: none  Total minutes spent: 20

## 2023-02-16 NOTE — Telephone Encounter (Signed)
Attempted to return patient call again.  No answer and no answering machine.    Lavone Nian, RN  02/16/2023  10:40 AM

## 2023-03-05 ENCOUNTER — Telehealth
Admit: 2023-03-05 | Discharge: 2023-03-05 | Payer: BLUE CROSS/BLUE SHIELD | Attending: Ambulatory Care | Primary: Internal Medicine

## 2023-03-05 DIAGNOSIS — E7800 Pure hypercholesterolemia, unspecified: Secondary | ICD-10-CM

## 2023-03-05 MED ORDER — tirzepatide (Mounjaro) 2.5 mg/0.5 mL pen injector
2.5 | SUBCUTANEOUS | 3 refills | 28.00000 days | Status: AC
Start: 2023-03-05 — End: ?
  Filled 2023-03-19: qty 2, 28d supply, fill #0

## 2023-03-05 MED ORDER — pen needle, diabetic (BD Nano 2nd Gen Pen Needle) 32 gauge x 5/32" needle
32 | 3 refills | 90.00000 days | Status: AC
Start: 2023-03-05 — End: ?
  Filled 2023-03-16: qty 100, 90d supply, fill #0

## 2023-03-05 MED ORDER — ergocalciferol, vitamin D2, (Vitamin D-2) 1.25 MG (50000 UT) capsule
1.25 | ORAL_CAPSULE | ORAL | 3 refills | 28.00000 days | Status: DC
Start: 2023-03-05 — End: 2023-03-29

## 2023-03-05 MED ORDER — blood-glucose sensor (Dexcom G7 Sensor) device
3 refills | 30.00000 days | Status: AC
Start: 2023-03-05 — End: ?

## 2023-03-05 MED ORDER — atorvastatin (Lipitor) 40 mg tablet
40 | ORAL_TABLET | Freq: Every day | ORAL | 3 refills | 90.00000 days | Status: DC
Start: 2023-03-05 — End: 2023-03-29

## 2023-03-05 MED ORDER — insulin glargine (Basaglar KwikPen U-100 Insulin) 100 unit/mL (3 mL) injection
100 | Freq: Every morning | SUBCUTANEOUS | 11 refills | 60.00000 days | Status: AC
Start: 2023-03-05 — End: ?
  Filled 2023-03-16: qty 15, 30d supply, fill #0

## 2023-03-05 MED ORDER — hydroCHLOROthiazide (HYDRODiuril) 25 mg tablet
25 | ORAL_TABLET | Freq: Every day | ORAL | 2 refills | 90.00000 days | Status: DC
Start: 2023-03-05 — End: 2023-03-29

## 2023-03-05 MED ORDER — sertraline (Zoloft) 25 mg tablet
25 | ORAL_TABLET | Freq: Every day | ORAL | 1 refills | Status: DC
Start: 2023-03-05 — End: 2023-03-29

## 2023-03-05 NOTE — Progress Notes (Addendum)
[facility] [place]  [address]  [place] [address]  Phone: [phone]  Fax: [phone]     Subjective   [name] is a 47 y.o. female, here for for follow up for collaborative management of diabetes with Heidi Sole, MD. [name] also has hypertension, obesity.    Plan at last visit:  Events since last visit:   - started working on setting up Dexcom G7 account but did not finish  - has not yet started Vit D, was worried about side effects  - started atorvastatin and sertraline  - reports looser stools, likely timed with Mounjaro  - Had a headache yesterday, wasn't sure if it was because she hadn't eaten in a while, different from hypoglycemia symptoms    DIABETES  Current medications: Mounjaro 2.5 mg weekly on Thursdays, Basaglar 40 units daily  Previously tried:    Animal nutritionist in abdomen, Higher education careers adviser in thighs    Meter: Has Dexcom G7   Hypoglycemia: clammy, slurred speech, headahces   Testing BG: not checking  Home BG readings:    Lab Results   Component Value Date    HGBA1C 12.3 (H) 01/16/2023    HGBA1C 7.4 10/13/2021    HGBA1C 5.7 (H) 02/17/2021     Lab Results   Component Value Date    MICROALBCREA <4 01/16/2023        Preventive Therapy  Health Maintenance Due   Topic Date Due   . Diabetes: Foot Exam  Never done   . Pneumococcal Vaccine: Pediatrics (0 to 5 Years) and At-Risk Patients (6 to 34 Years) (2 of 2 - PCV) 01/26/2011     ACEi/ARB: none  ASA: n/a  Statin: atorvastatin 40 mg    LIFESTYLE    Diet: snacking throughout the day. Thinks might be snacking a little less since starting Mounjaro. Strawberries, cookies, chips, etc  Beverages:   Alcohol:   Activity/Exercise:     MEDICATION MANAGEMENT    [name] does not have assistance managing and taking medications. Patient uses pill box.     Medication side effects: no issues  Medication cost: no issues  Medication adherence: none missed in the last 2 weeks    Medication list, fill history, and  drug allergies reviewed with the patient. Patient is a good historian of medications and indications. The following discrepancies were discovered: has note yet started Vit D as she was concerned about high dosage    Objective       05/16/2022     4:26 PM 06/13/2022    12:02 PM 01/16/2023    12:25 PM   Vitals   Systolic 123 124 425   Diastolic 71 76 74   Pulse 80 94 88   Temp 37.3 C (99.2 F) 36.6 C (97.8 F) 36.1 C (96.9 F)   Height (in) 1.676 m 1.676 m 1.676 m   Weight (lb) 215 209 211.6   SpO2 96 % 97 % 100 %   BMI 34.72 kg/m2 33.75 kg/m2 34.15 kg/m2   BSA (m2) 2.13 m2 2.1 m2 2.11 m2   Visit Report Report  Report     ASCVD 10 Year Risk Estimate: The 10-year ASCVD risk score (Arnett DK, et al., 2019) is: 2.7%    Values used to calculate the score:      Age: 19 years      Sex: Female      Is Non-Hispanic African American: Yes      Diabetic:  Yes      Tobacco smoker: No      Systolic Blood Pressure: 115 mmHg      Is BP treated: No      HDL Cholesterol: 39 mg/dL      Total Cholesterol: 144 mg/dL    Outpatient Medications Prior to Visit   Medication Sig Dispense Refill   . atorvastatin (Lipitor) 40 mg tablet Take 1 tablet (40 mg) by mouth once daily. 90 tablet 3   . blood-glucose sensor (Dexcom G7 Sensor) device Use to scan blood sugars. Change sensor every 10 days. 3 each 11   . ergocalciferol, vitamin D2, (Vitamin D-2) 1.25 MG (50000 UT) capsule Take 1 capsule (50,000 Units) by mouth 1 (one) time per week. 12 capsule 3   . hydroCHLOROthiazide (HYDRODiuril) 25 mg tablet Take 1 tablet (25 mg) by mouth once daily. 90 tablet 2   . insulin glargine (Basaglar KwikPen U-100 Insulin) 100 unit/mL (3 mL) injection Inject 40 Units under the skin in the morning. 15 mL 11   . pen needle, diabetic (BD Nano 2nd Gen Pen Needle) 32 gauge x 5/32" needle Use daily to inject insulin 100 each 3   . sertraline (Zoloft) 25 mg tablet TAKE 1 TABLET BY MOUTH EVERY DAY 90 tablet 1   . tirzepatide (Mounjaro) 2.5 mg/0.5 mL pen injector Inject 0.5  mL (2.5 mg) under the skin every 7 (seven) days. 4 mL 3       Medications Discontinued During This Encounter   Medication Reason   . pen needle, diabetic (BD Nano 2nd Gen Pen Needle) 32 gauge x 5/32" needle Reorder   . blood-glucose sensor (Dexcom G7 Sensor) device Reorder   . atorvastatin (Lipitor) 40 mg tablet Reorder   . hydroCHLOROthiazide (HYDRODiuril) 25 mg tablet Reorder   . tirzepatide (Mounjaro) 2.5 mg/0.5 mL pen injector Reorder   . ergocalciferol, vitamin D2, (Vitamin D-2) 1.25 MG (50000 UT) capsule Reorder   . sertraline (Zoloft) 25 mg tablet Reorder   . insulin glargine (Basaglar KwikPen U-100 Insulin) 100 unit/mL (3 mL) injection Reorder       Current Outpatient Medications   Medication Instructions   . atorvastatin (LIPITOR) 40 mg, oral, Daily   . blood-glucose sensor (Dexcom G7 Sensor) device Use to scan blood sugars. Change sensor every 10 days.   . ergocalciferol (vitamin D2) (VITAMIN D-2) 50,000 Units, oral, Weekly   . hydroCHLOROthiazide (HYDRODIURIL) 25 mg, oral, Daily   . Lantus Solostar U-100 Insulin 40 Units, subcutaneous, Every morning   . Mounjaro 2.5 mg, subcutaneous, Every 7 days   . pen needle, diabetic (BD Nano 2nd Gen Pen Needle) 32 gauge x 5/32" needle Use daily to inject insulin   . sertraline (ZOLOFT) 25 mg, oral, Daily     Allergies   Allergen Reactions   . Penicillin V Potassium Rash     Other reaction(s): rash as child         No past medical history on file.  No past surgical history on file.    Assessment/Plan   High cholesterol  -     atorvastatin (Lipitor) 40 mg tablet; Take 1 tablet (40 mg) by mouth once daily.  Type 2 diabetes mellitus with mild nonproliferative retinopathy without macular edema, with long-term current use of insulin, unspecified laterality (CMS-HCC) (HHS-HCC)  Assessment & Plan:  [name]'s last A1c 12.3 is not at goal of A1c <7% per ADA guidelines, increased from 7.4% last year. Recent home fasting and post - prandial blood glucose  readings are likely  not at goal of 80 - 130 and 80 - 180. Patient is not experiencing hypoglycemia. Encouraged importance to check BGs when she feels symptoms.  Taking Mounjaro and Baglar with good adherence. Tolerating Mounjaro start well, as she is still having some loose stools (improving) and not currenlty monitoring BGs, will continue with current dose for now.  Encouaraged to setup and place CGM tonight. Verbally reviewed placement instructions. Will review data once she is set up.     Plan:   1. Setup Dexcom G7 tonight, will send portal message when set  2. Continue current medications    Orders:  -     blood-glucose sensor (Dexcom G7 Sensor) device; Use to scan blood sugars. Change sensor every 10 days.  -     insulin glargine (Basaglar KwikPen U-100 Insulin) 100 unit/mL (3 mL) injection; Inject 40 Units under the skin in the morning.  -     pen needle, diabetic (BD Nano 2nd Gen Pen Needle) 32 gauge x 5/32" needle; Use daily to inject insulin  -     tirzepatide (Mounjaro) 2.5 mg/0.5 mL pen injector; Inject 0.5 mL (2.5 mg) under the skin every 7 (seven) days.  Low vitamin D level  -     ergocalciferol, vitamin D2, (Vitamin D-2) 1.25 MG (50000 UT) capsule; Take 1 capsule (50,000 Units) by mouth 1 (one) time per week.  Hypertension, unspecified type  -     hydroCHLOROthiazide (HYDRODiuril) 25 mg tablet; Take 1 tablet (25 mg) by mouth once daily.  Mild depression  -     sertraline (Zoloft) 25 mg tablet; Take 1 tablet (25 mg) by mouth once daily.  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 Recent home fasting and post - prandial blood glucose readings are likely not at goal of 80 - 130 and 80 - 180. Patient is not experiencing hypoglycemia. Encouraged importance to check  BGs when she feels symptoms.  Taking Mounjaro and Baglar with good adherence. Tolerating Mounjaro start well, as she is still having some loose stools (improving) and not currenlty monitoring BGs, will continue with current dose for now.  Encouaraged to setup and place CGM tonight. Verbally reviewed placement instructions. Will review data once she is set up.     Plan:   1. Setup Dexcom G7 tonight, will send portal message when set  2. Continue current medications    Medication management   - Encouraged to take Vit D same time she takes [facility] for easier adherence.  - Will switch to [facility] for easier medication refill management, refills sent    PCP f/u in 3 weeks  PharmD f/u in: 2 months      This real-time, interactive virtual Telehealth encounter was done by video with the patient's verbal consent. Two patient identifiers were used and confirmed. Physical location of the patient: home. Patient resides in: [place]  Physical location of the provider: office. Other participants/involvement: none  Total minutes spent: [address]    Neta Ehlers, PharmD, [place], BCPS  03/06/2023  12:41 PM

## 2023-03-05 NOTE — Assessment & Plan Note (Addendum)
[name]'s last A1c 12.3 is not at goal of A1c <7% per ADA guidelines, increased from 7.4% last year. Recent home fasting and post - prandial blood glucose readings are likely not at goal of 80 - 130 and 80 - 180. Patient is not experiencing hypoglycemia. Encouraged importance to check BGs when she feels symptoms.  Taking Mounjaro and Baglar with good adherence. Tolerating Mounjaro start well, as she is still having some loose stools (improving) and not currenlty monitoring BGs, will continue with current dose for now.  Encouaraged to setup and place CGM tonight. Verbally reviewed placement instructions. Will review data once she is set up.     Plan:   1. Setup Dexcom G7 tonight, will send portal message when set  2. Continue current medications

## 2023-03-16 MED FILL — DEXCOM G7 SENSOR DEVICE: 30 days supply | Qty: 3 | Fill #0 | Status: CN

## 2023-03-16 NOTE — Progress Notes (Signed)
Specialty Pharmacy Refill Coordination Note     [name] is a 47 y.o. female contacted today regarding refills of her specialty medication(s).    Reviewed and verified with patient:                Specialty medication(s) and dose(s) confirmed: yes  Changes to medications: n/a  Changes to insurance: n/a    Refill Questions    Flowsheet Row Most Recent Value   Patient Contact Attempts Patient reached   HIPPA Verified? Yes   Specialty Clinics --  [[facility]]   Changes to allergies? No   Changes to medications? No   New conditions since last clinic visit No   Unplanned office visit, urgent care, ED, or hospital admission in the last 4 weeks  No   How does patient/caregiver feel medication is working? Good   Financial problems or insurance changes  No          Delivery Questions    Flowsheet Row Most Recent Value   Delivery method FedEx   Delivery address correct? Yes   Delivery phone number [phone]   Preferred delivery time? Anytime   Number of medications in delivery 4   Is there any medication that is due not being filled? No   Supplies needed? No supplies needed   Cooler needed? Yes   Do any medications need mixed or dated? No   Copay form of payment Credit card on file   Questions or concerns for the pharmacist? No   Are any medications first time fills? No   Tracking number for delivery [id number]   Shipment status Delivery complete, Cooler packed                 Follow-up: 28  day(s)     [name], CPhT  Specialty Pharmacy Technician

## 2023-03-29 ENCOUNTER — Ambulatory Visit
Admit: 2023-03-29 | Discharge: 2023-03-29 | Payer: BLUE CROSS/BLUE SHIELD | Attending: Internal Medicine | Primary: Internal Medicine

## 2023-03-29 DIAGNOSIS — E113299 Type 2 diabetes mellitus with mild nonproliferative diabetic retinopathy without macular edema, unspecified eye: Secondary | ICD-10-CM

## 2023-03-29 DIAGNOSIS — E7800 Pure hypercholesterolemia, unspecified: Secondary | ICD-10-CM

## 2023-03-29 LAB — POCT GLUCOSE: POCT Glucose: 114 mg/dL (ref 70–139)

## 2023-03-29 LAB — POCT HEMOGLOBIN A1C: POCT Hemoglobin A1C: 8.3 % — ABNORMAL HIGH (ref <=5.7)

## 2023-03-29 MED ORDER — atorvastatin (Lipitor) 40 mg tablet
40 | ORAL_TABLET | Freq: Every day | ORAL | 3 refills | 90.00000 days | Status: AC
Start: 2023-03-29 — End: ?

## 2023-03-29 MED ORDER — hydroCHLOROthiazide (HYDRODiuril) 25 mg tablet
25 | ORAL_TABLET | Freq: Every day | ORAL | 2 refills | 90.00000 days | Status: AC
Start: 2023-03-29 — End: ?

## 2023-03-29 MED ORDER — ergocalciferol, vitamin D2, (Vitamin D-2) 1.25 MG (50000 UT) capsule
1.25 | ORAL_CAPSULE | ORAL | 3 refills | 28.00000 days | Status: AC
Start: 2023-03-29 — End: ?

## 2023-03-29 MED ORDER — sertraline (Zoloft) 25 mg tablet
25 | ORAL_TABLET | Freq: Every day | ORAL | 1 refills | Status: AC
Start: 2023-03-29 — End: ?

## 2023-03-29 NOTE — Progress Notes (Signed)
[facility] [place]   [address]  [address]  [place] [address]  [phone]     Patient ID:  [name] is a 47 y.o. female who presents for f/u MMP     Subjective     screening. DM    Eats one meal and snacks through the day   Did have low perhaps hadn't had enough; CGM just put on today, now   Basaglar/lantus (insurance issues) 40 U currently, mounjaro 2.5 mg    No issues with the mounjaro  No nausea, constipation    1/2 banana today FS is 114  Sometimes no time to eat in AM or something leftover  Yesterday went to office, didn't have much to eat, so munched on popcorn, water  doesnt eat until off  Then didn't eat all day  Sometimes peanut butter crackers or banana   Has lost 10 lbs  Lab Results   Component Value Date    HGBA1C 8.3 (H) 03/29/2023         Wt Readings from Last 9 Encounters:   03/29/23 91.7 kg   01/16/23 96 kg   06/13/22 94.8 kg   05/16/22 97.5 kg   11/14/21 100 kg   10/13/21 96.6 kg   02/17/21 89.9 kg   08/03/20 102.6 kg   02/12/20 104.6 kg     Lab Results   Component Value Date    HGBA1C 8.3 (H) 03/29/2023       Patient Health Questionnaire-9 Score: 4   Interpretation: Negative screening.     Follow-up & Interventions: Patient currently receiving treatment for depression  Generalized Anxiety Disorder screening - GAD-7 Score: 1  Interpretation: Negative screening.  Follow up & Intervention: Maintain annual screening - No additional Follow-up required                   Patient Active Problem List   Diagnosis   . Unspecified abnormal cytological findings in specimens from cervix uteri   . 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 Horner's syndrome   . Fatigue   . Bilateral carpal tunnel syndrome   . Closed dislocation of patella   . High cholesterol   . Hypertension   . Hidradenitis   . Marijuana smoker   . Depression, major, in remission (CMS-HCC)      No past surgical history on file.     Family  History:  family history includes Diabetes in her mother.     Social History     Tobacco Use   . Smoking status: Former   . Smokeless tobacco: None   Vaping Use   . Vaping Use: Never used   Substance Use Topics   . Alcohol use: None   . Drug use: Yes     Types: Marijuana     Comment: every other day     Allergies   Allergen Reactions   . Penicillin V Potassium Rash     Other reaction(s): rash as child       Current Outpatient Medications   Medication Instructions   . atorvastatin (LIPITOR) 40 mg, oral, Daily   . blood-glucose sensor (Dexcom G7 Sensor) device Use to scan blood sugars. Change sensor every 10 days.   . ergocalciferol (vitamin D2) (VITAMIN D-2) 50,000 Units, oral, Weekly   . hydroCHLOROthiazide (HYDRODIURIL) 25 mg, oral, Daily   . Lantus Solostar U-100 Insulin 40 Units, subcutaneous, Every morning   .  Mounjaro 2.5 mg, subcutaneous, Every 7 days   . pen needle, diabetic (BD Nano 2nd Gen Pen Needle) 32 gauge x 5/32" needle Use daily to inject insulin   . sertraline (ZOLOFT) 25 mg, oral, Daily        Objective    Visit Vitals  BP 114/70   Pulse 78   Temp 36.4 C (97.5 F) (Temporal)   Ht 1.651 m   Wt 91.7 kg   SpO2 100%   BMI 33.65 kg/m   BSA 2.05 m     Wt Readings from Last 5 Encounters:   03/29/23 91.7 kg   01/16/23 96 kg   06/13/22 94.8 kg   05/16/22 97.5 kg   11/14/21 100 kg   {    Physical Exam     NAD       Lab Results   Component Value Date    WBC 7.5 01/16/2023    HGB 11.9 01/16/2023    HCT 36.0 01/16/2023    PLT 329 01/16/2023    CHOL 144 01/16/2023    TRIG 82 01/16/2023    HDL 39 (L) 01/16/2023    LDLCALC 89 01/16/2023    ALT 21 01/16/2023    AST 21 01/16/2023    NA 143 01/16/2023    K 4.3 01/16/2023    CL 104 01/16/2023    CREATININE 0.89 01/16/2023    BUN 12 01/16/2023    EGFR 80 01/16/2023    CO2 22 01/16/2023    TSH 1.100 01/16/2023    HGBA1C 8.3 (H) 03/29/2023    MICROALBUR <3.0 01/16/2023          Assessment/Plan      Sheldon was seen today for follow-up.  High cholesterol  -      atorvastatin (Lipitor) 40 mg tablet; Take 1 tablet (40 mg) by mouth once daily.  Low vitamin D level  -     ergocalciferol, vitamin D2, (Vitamin D-2) 1.25 MG (50000 UT) capsule; Take 1 capsule (50,000 Units) by mouth 1 (one) time per week.  Hypertension, unspecified type  -     hydroCHLOROthiazide (HYDRODiuril) 25 mg tablet; Take 1 tablet (25 mg) by mouth once daily.  Mild depression  -     sertraline (Zoloft) 25 mg tablet; Take 1 tablet (25 mg) by mouth once daily.  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, with long-term current use of insulin (CMS/HCC)  Uncontrolled now with a1c 12--today 4/24 down to 8.3  Due for eye-she will book at [facility]--pending for June  Basaglar-40 Units with some lows, advised regular eating and decrease to 35 U plus CGM and has appt with pharmacy next week; Jeb Levering and we decided not to change mounjaro until next week  ;   Mounjaro2.5 mg;   NOTon ace, neg microalb   NOT on statin when LDL drawn, now back on         Lab Results   Component Value Date    MICROALBCREA <4 01/16/2023    HGBA1C 12.3 (H) 01/16/2023    LDLCALC 89 01/16/2023       Low Vit D now on replacement  Marijuana smo ker  Assessment & Plan:  Advised d/c, occasional use    Horner's syndrome  Assessment & Plan:  F/b [facility]; [facility] May   Primary hypertension  Assessment & Plan:  Stable on hctz 25; ok  bmp;   High cholesterol  Assessment & Plan:  Restartedatorvastatin/has not currenlty been adherent. She will restartalthough LDL was 89       Unspecified abnormal cytological findings in specimens from cervix uteri  LSIL 2/22 pap, 4/22 benign colpo at [facility]. 1 year cotest f/u advised--done  with recent colpo (around 04/2022//will need results) told 1 year f/u cotest advised; She will do at [facility] -pap scheduled for next week 6/24         mild depression/mild anxiety/lack of motivation  Ambivalent about therapy, discussed synchronous she will consider  Now improved on zoloft  25 mg          Last CE: 5/15; 1/17; 4/18 ; 12/19; 9/21; 12/22; book     Pap: /10 ascus; colpo neg; 12/11 colpo lsil; 6/12 ascus, hpv not done; 4/13 pap neg, hr hpv pos, colpo neg; 5/14 pap/hpv neg; pap May 2015-neg pap, did not receive hpv results; 2/22 LSIL , hpv pos, colpo benign; 1 year cotest advised2023 pap abnormal colpo 2023/recent, "negative" per pt; 1 year cotest pending 6/24     Mammo:06/2022  Colo:order cologuard/was not complete; has new one and hasn't done it;discussed today she will send  BMD:ni  [facility]   Dental: utd  Choleseterol      Risk Score Cholesterol: on high intensity statin  Tdap: 2012,2018  MMR: vax 1978, 1994  Varicella:kid  Hep B:3 vax  Pvax:2011  Flu 2015, 2016;2019; got 2022  Zoster:ni  HPV:ni  covid vax x3        A total of 33 minutes was spent on this encounter, reviewing previous records, performing the exam, counseling/education of the patient regarding treatment plans and clinical documentation into the EMR.        Follow up in about 4 weeks (around 04/26/2023) for july 18th at 8:40 AM put in 40 min appt (I know only 20 min spot) .

## 2023-04-24 ENCOUNTER — Ambulatory Visit: Payer: BLUE CROSS/BLUE SHIELD | Attending: Ambulatory Care | Primary: Internal Medicine

## 2023-05-22 ENCOUNTER — Ambulatory Visit: Payer: BLUE CROSS/BLUE SHIELD | Attending: Internal Medicine | Primary: Internal Medicine

## 2023-06-20 NOTE — Progress Notes (Signed)
Specialty Refill Coordination Note    [name] is a 47 y.o. female contacted today regarding refills of her specialty medication(s).    Reviewed and verified with patient:               Medication(s) and dose(s) confirmed: Yes  Changes to medications: No  Changes to insurance: No  Ship date: 06/22/23    Medication(s) being shipped:   Outpatient Encounter Medications as of 06/20/2023   Medication Sig Dispense    tirzepatide (Mounjaro) 2.5 mg/0.5 mL pen injector Inject 0.5 mL (2.5 mg) under the skin every 7 (seven) days. 4 mL         Refill Questions      Flowsheet Row Most Recent Value   Patient Contact Attempts Patient reached   HIPPA Verified? Yes   Changes to allergies? No   Changes to medications? No   New conditions since last clinic visit No   Unplanned office visit, urgent care, ED, or hospital admission in the last 4 weeks  No   How does patient/caregiver feel medication is working? Good   Financial problems or insurance changes  No            Delivery Questions      Flowsheet Row Most Recent Value   Delivery method Courier   Delivery address correct? Yes   Preferred delivery time? Anytime   Number of medications in delivery 1   Is there any medication that is due not being filled? No   Do any medications need mixed or dated? No   Copay form of payment Credit card on file   Questions or concerns for the pharmacist? No   Are any medications first time fills? No   Tracking number for delivery courier   Shipment status Delivery complete          Karthik Whittinghill Man Cathie Hoops, PharmD, RPh, [facility]

## 2023-06-22 MED FILL — MOUNJARO 2.5 MG/0.5 ML SUBCUTANEOUS PEN INJECTOR: 2.5 2.5 mg/0.5 mL | SUBCUTANEOUS | 28 days supply | Qty: 2 | Fill #1 | Status: CP

## 2023-07-10 ENCOUNTER — Ambulatory Visit
Admit: 2023-07-10 | Discharge: 2023-07-10 | Payer: BLUE CROSS/BLUE SHIELD | Attending: Internal Medicine | Primary: Internal Medicine

## 2023-07-10 DIAGNOSIS — Z0000 Encounter for general adult medical examination without abnormal findings: Secondary | ICD-10-CM

## 2023-07-10 DIAGNOSIS — L309 Dermatitis, unspecified: Secondary | ICD-10-CM

## 2023-07-10 MED ORDER — tirzepatide (Mounjaro) 5 mg/0.5 mL pen injector
5 | SUBCUTANEOUS | 11 refills | 28.00000 days | Status: AC
Start: 2023-07-10 — End: ?
  Filled 2023-08-08: qty 2, 28d supply, fill #0

## 2023-07-10 MED ORDER — insulin glargine (Lantus) 100 unit/mL (3 mL) injection
100 | Freq: Every evening | SUBCUTANEOUS | 12 refills | 60.00000 days | Status: AC
Start: 2023-07-10 — End: ?

## 2023-07-10 MED ORDER — insulin glargine (Basaglar KwikPen U-100 Insulin) 100 unit/mL (3 mL) injection
100 | Freq: Every morning | SUBCUTANEOUS | 11 refills | 60.00000 days | Status: DC
Start: 2023-07-10 — End: 2023-07-10

## 2023-07-10 NOTE — Progress Notes (Signed)
[facility] PRIMARY [facility] Section   [address]  [address]  [place] [address]  [phone]     Patient ID:  Fynlee Coriell is a 47 y.o. female who presents for annual     Subjective    HPI   DM  Was out of mounjaro but now has been back on it a few weeks  Has some stress at home, daughters father is in hospital with pancreatitis right now  Has been eating irregularly  Financial stress  No lows  A1c today is 7.0!!!   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     Family History:  family history includes Diabetes in her mother.     Social History     Tobacco Use    Smoking status: Every Day     Types: Cigarettes    Smokeless tobacco: None   Vaping Use    Vaping Use: Never used   Substance Use Topics    Alcohol use: None    Drug use: Yes     Types: Marijuana     Comment: every other day     Allergies   Allergen Reactions    Penicillin V Potassium Rash     Other reaction(s): rash as child       Current Outpatient Medications   Medication Instructions    atorvastatin (LIPITOR) 40 mg, oral, Daily    blood-glucose sensor (Dexcom G7 Sensor) device Use to scan blood sugars. Change sensor  every 10 days.    ergocalciferol (vitamin D2) (VITAMIN D-2) 50,000 Units, oral, Weekly    hydroCHLOROthiazide (HYDRODIURIL) 25 mg, oral, Daily    Lantus Solostar U-100 Insulin 30 Units, subcutaneous, Nightly    Mounjaro 5 mg, subcutaneous, Every 7 days    pen needle, diabetic (BD Nano 2nd Gen Pen Needle) 32 gauge x 5/32" needle Use daily to inject insulin    sertraline (ZOLOFT) 25 mg, oral, Daily        Objective    Visit Vitals  BP 103/68   Pulse 101   Temp 36.8 C (98.2 F) (Temporal)   Ht 1.651 m   Wt 89.8 kg   SpO2 100%   BMI 32.95 kg/m   BSA 2.03 m     Wt Readings from Last 5 Encounters:   07/10/23 89.8 kg   03/29/23 91.7 kg   01/16/23 96  kg   06/13/22 94.8 kg   05/16/22 97.5 kg   {    Physical Exam   Constitutional:       General: No acute distress      Appearance: Normal appearance.   HENT:      Head: Normocephalic and atraumatic.      Mouth: Mucous membranes are moist.      Pharynx: Oropharynx is clear.   Eyes:      Extraocular Movements: Extraocular movements intact.      Conjunctiva/sclera: Conjunctivae normal.      Pupils: Pupils are equal, round, and reactive to light.   Neck:      Vascular: No carotid bruit.   Cardiovascular:      Rate and Rhythm: Normal rate and regular rhythm.      Pulses: Normal pulses.      Heart sounds: Normal heart sounds.   Pulmonary:      Effort: Pulmonary effort is normal. No respiratory distress.      Breath sounds: Normal breath sounds.    Abdominal:      General: Bowel sounds are normal. There is no distension.      Palpations: Abdomen is soft. There is no mass.      Tenderness: There is no abdominal tenderness. There is no guarding or rebound.   Musculoskeletal:         General: No swelling, tenderness or deformity. Normal range of motion.      Cervical back: No rigidity or tenderness.   Lymphadenopathy:      Cervical: No cervical adenopathy.   Skin:     General: Skin is warm and dry.      Findings: some hyperpigementation on wrist (also ganglion cyst on wrist/discussed,  defer referral for now)   Neurological:      General: No focal deficit present.        Psychiatric:         Mood and Affect: Mood normal.         Behavior: Behavior normal.         Thought Content: Thought content normal.         Judgment: Judgment normal.           Lab Results   Component Value Date    WBC 7.5 01/16/2023    HGB 11.9 01/16/2023    HCT 36.0 01/16/2023    PLT 329 01/16/2023    CHOL 144 01/16/2023    TRIG 82 01/16/2023    HDL 39 (L) 01/16/2023    LDLCALC 89 01/16/2023    ALT 21 01/16/2023    AST 21 01/16/2023    NA 143 01/16/2023    K 4.3 01/16/2023    CL 104 01/16/2023    CREATININE 0.89 01/16/2023    BUN 12 01/16/2023    EGFR 80 01/16/2023    CO2 22 01/16/2023    TSH 1.100 01/16/2023    HGBA1C 7.0 (H) 07/10/2023    MICROALBUR <3.0 01/16/2023          Assessment/Plan      Promiss was seen today for follow-up.  Eczema, unspecified type  -     INT  [facility] (T GEN DERMATOLOGY); Future  Type 2 diabetes mellitus with mild nonproliferative retinopathy without macular edema, with long-term current use of insulin, unspecified laterality (Multi-HCC)  -     tirzepatide (Mounjaro) 5 mg/0.5 mL pen injector; Inject 0.5 mL (5 mg) under the skin every 7 (seven) days.  -  insulin glargine (Lantus) 100 unit/mL (3 mL) injection; Inject 30 Units under the skin at bedtime.  -     INT  [facility]hthal[facility] (T COMP OPHTHAL); Future  Other orders  -     POCT glycosylated hemoglobin (HBG A1C)              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, with long-term current use of insulin (CMS/HCC)    improved, now controlled on mounjaro 2.5 mg    Due for eye-she would like to change to [place],   Lantus was 35 mg// mounjaro 2.5 mg; now will increase mounjaro to 5.0 mg and decrease to 30 mg     NOT  on ace, neg microalb   NOT on statin when LDL drawn, now back on   Lab Results   Component  Value Date    MICROALBCREA <4 01/16/2023    HGBA1C 7.0 (H) 07/10/2023    LDLCALC 89 01/16/2023          Low Vit D now on replacement      Marijuana smo ker  Assessment & Plan:  Advised d/c, occasional use      Horner's syndrome  Assessment & Plan:  F/b [facility]; would like to switch to Rew/order   Primary hypertension  Assessment & Plan:  Stable on hctz 25; ok  bmp;   High cholesterol  Assessment & Plan:  Restarted atorvastatin/has not currenlty been adherent . She will restart although LDL was 89         Unspecified abnormal cytological findings in specimens from cervix uteri  LSIL 2/22 pap, 4/22 benign colpo at [facility]. 1 year cotest f/u advised --done with recent colpo (around 04/2022//will need results) told 1 year f/u cotest advised ; She will do at [facility] -pap scheduled for Thursday 9/24             mild depression/mild anxiety/lack of motivation  Ambivalent about therapy, discussed synchronous she will consider   Now improved on zoloft  25 mg          eczema -requests derm referral      Last CE: 5/15; 1/17; 4/18 ; 12/19; 9/21 ; 12/22; 9/24      Pap: /10 ascus; colpo neg; 12/11 colpo lsil; 6/12 ascus, hpv not done; 4/13 pap neg, hr hpv pos, colpo neg; 5/14 pap/hpv neg;  pap May 2015-neg pap, did not receive hpv results; 2/22 LSIL , hpv pos, colpo benign; 1 year cotest advised 2023 pap abnormal colpo 2023/recent, "negative" per pt; 1 year cotest pending 6/24      Mammo: 06/2022   Colo:order cologuard Rene Kocher not complete; has new one and hasn't done it; discussed today she will send  BMD:ni  Eye care DUE, does at [facility] pending for June 2024   Dental: utd  Choleseterol         Risk Score Cholesterol: on high intensity statin  Tdap: 2012,2018  MMR: vax 1978, 1994  Varicella:kid  Hep B:3 vax  Pvax:2011  Flu 2015, 2016;2019; got 2022  Zoster:ni  HPV:ni  covid vax x 3             Follow up in about 7 weeks (around 08/28/2023) for video visit book .

## 2023-07-12 LAB — POCT HEMOGLOBIN A1C: POCT Hemoglobin A1C: 7 % — ABNORMAL HIGH (ref <=5.7)

## 2023-07-17 NOTE — Telephone Encounter (Signed)
The patient has previously scheduled for an appointment- due DM A1C Control. Thank you Hansel Starling- Population Health [facility]-Team

## 2023-07-19 ENCOUNTER — Ambulatory Visit: Payer: BLUE CROSS/BLUE SHIELD | Attending: Ambulatory Care | Primary: Internal Medicine

## 2023-08-07 NOTE — Progress Notes (Signed)
Specialty Pharmacy Refill Coordination Note     [name] is a 47 y.o. female contacted today regarding refills of her specialty medication(s).    Reviewed and verified with patient:                Specialty medication(s) and dose(s) confirmed: yes  Changes to medications: n/a  Changes to insurance: n/a    Refill Questions      Flowsheet Row Most Recent Value   Patient Contact Attempts Patient reached   HIPPA Verified? Yes   Specialty Clinics --  [[facility]]   Changes to allergies? No   Changes to medications? No   New conditions since last clinic visit No   Unplanned office visit, urgent care, ED, or hospital admission in the last 4 weeks  No   How does patient/caregiver feel medication is working? Good   Financial problems or insurance changes  No            Delivery Questions      Flowsheet Row Most Recent Value   Delivery method FedEx   Delivery address correct? Yes   Delivery phone number [phone]   Preferred delivery time? Anytime   Number of medications in delivery 1   Is there any medication that is due not being filled? No   Supplies needed? No supplies needed   Cooler needed? Yes   Do any medications need mixed or dated? No   Copay form of payment Credit card on file   Questions or concerns for the pharmacist? No   Are any medications first time fills? No   Tracking number for delivery [id number]   Shipment status Delivery complete, Cooler packed                   Follow-up: 28  day(s)     Thierry Dobosz, CPhT  Specialty Pharmacy Technician

## 2023-08-17 ENCOUNTER — Ambulatory Visit
Payer: BLUE CROSS/BLUE SHIELD | Attending: Student in an Organized Health Care Education/Training Program | Primary: Internal Medicine

## 2023-08-17 NOTE — Progress Notes (Deleted)
New patient    HPI  [name] is a 47 y.o. female who presents for the following:     ***    ROS  Patient feels well. No other skin complaints. No other systemic symptoms.    Physical Exam/Impression/Plan  Well appearing patient in no apparent distress; mood and affect are within normal limits. Skin exam was performed of the following and pertinent positives are included below:   Face, neck, chest, back, abdomen, upper extremities, hands, lower extremities, feet, digits and nails, groin, buttocks, scalp. ***        RTC***    I, Ardeth Perfect, am scribing for, and in the presence of, Liliane Bade, MD.     I, Liliane Bade, MD personally performed the services described in the documentation as scribed by Ardeth Perfect in my presence, and confirm it is both accurate and complete.

## 2023-08-30 ENCOUNTER — Ambulatory Visit: Payer: BLUE CROSS/BLUE SHIELD | Attending: Ophthalmology | Primary: Internal Medicine

## 2023-09-03 ENCOUNTER — Ambulatory Visit: Payer: BLUE CROSS/BLUE SHIELD | Attending: Internal Medicine | Primary: Internal Medicine

## 2023-09-14 ENCOUNTER — Ambulatory Visit
Admit: 2023-09-14 | Discharge: 2023-09-14 | Payer: BLUE CROSS/BLUE SHIELD | Attending: Student in an Organized Health Care Education/Training Program | Primary: Internal Medicine

## 2023-09-14 DIAGNOSIS — D239 Other benign neoplasm of skin, unspecified: Secondary | ICD-10-CM

## 2023-09-14 MED ORDER — tacrolimus (Protopic) 0.1 % ointment
0.1 | TOPICAL | 2 refills | 30.00000 days | Status: AC
Start: 2023-09-14 — End: ?

## 2023-09-14 MED ORDER — hydrocortisone 2.5 % ointment
2.5 | TOPICAL | 2 refills | 30.00000 days | Status: AC
Start: 2023-09-14 — End: ?

## 2023-09-14 NOTE — Progress Notes (Deleted)
 New patient    HPI  [name] is a 47 y.o. female who presents for the following:     ***    ROS  Patient feels well. No other skin complaints. No other systemic symptoms.    Physical Exam/Impression/Plan  Well appearing patient in no apparent distress; mood and affect are within normal limits. Skin exam was performed of the following and pertinent positives are included below:   Face, neck, chest, back, abdomen, upper extremities, hands, lower extremities, feet, digits and nails, groin, buttocks, scalp. ***        RTC***    I, Ardeth Perfect, am scribing for, and in the presence of, Liliane Bade, MD.     I, Liliane Bade, MD personally performed the services described in the documentation as scribed by Ardeth Perfect in my presence, and confirm it is both accurate and complete.

## 2023-09-14 NOTE — Progress Notes (Signed)
New patient    HPI  Zain Bingman is a 47 y.o. female who presents for the following:     Itching and irritation of the eyes and wrists  - Has had flares more recently, but also in the past of wrists and eyes   - Wrists since youth have red itchy areas that come and go   - Has tried over the counter hydrocortisone with limited improvement, she has not tried other treatments    Dark spot on the left leg  - Not bothersome, dark spot, present for 1 year    3. Hair Loss  - patient notes history of hair loss but is not prepared to discuss at today's appointment and would like to schedule a follow up    Derm History  Denies personal or family history of skin cancer    ROS  Patient feels well. No other skin complaints. No other systemic symptoms.    Physical Exam/Impression/Plan  Well appearing patient in no apparent distress; mood and affect are within normal limits. Skin exam was performed of the following and pertinent positives are included below:   Face, neck, chest, back, abdomen, upper extremities, hands, lower extremities, feet, digits and nails, groin, buttocks, scalp.     Dermatofibroma  Left Thigh - Anterior  Hyperpigmented papule with positive dimple sign  - We discussed the etiology of the diagnosis and provided reassurance as to the benign nature. At this time, no treatment is indicated. Patient expressed her understanding and is amenable to the plan.  - Continue to monitor.     Intrinsic eczema  Left Eye, Left Wrist - Anterior, Right Eye, Right Wrist - Anterior  Left wrist with ill-defined scaly erythematous plaque with surrounding post-inflammatory hypopigmentation. Eyelids with thin eczematous patches. BSA: 2%  - Ddx: Atopic dermatitis with possible component of ICD vs. ACD based on history given by patient.   - Status: Chronic illness with exacerbation, progression or side effects of treatment   - Natural history and treatment options discussed.  Discussed gentle skin care, including lukewarm baths,  minimal soap, fragrance free products, frequent application of emollients.  - Possible triggers, including fragrances, skin and hair produces, and gel nails reviewed.   PLAN  - START hydrocortisone 2.5% oint to affected areas for flares 1-2 daily for up to 1 week, then take at least 1 week off.   - Side effects of topical steroids discussed, including: irritation, atrophy, acne, telangiectasias, lightening of the skin.  - START Protopic (tacrolimus) 0.1% oint to affected areas 1-2 daily for maintenance.   - Side effects of topical calcineurin inhibitors discussed, including: burning, irritation. Black box warning for possible cancer risk refers primarily to oral form and when applied to large body surface areas.  - Gentle skin care and products reviewed.    RTC soonest available to discuss hair loss    Warrick Parisian  Medical Student  09/14/23 10:15 AM    I personally interviewed and examined the patient and both the resident and I contributed to this electronic note.  I agree with the history, exam, assessment and plan as detailed in this note and edited it as necessary.    Belinda Fisher, MD  Dermatology Attending

## 2023-10-08 ENCOUNTER — Ambulatory Visit: Payer: BLUE CROSS/BLUE SHIELD | Attending: Internal Medicine | Primary: Internal Medicine

## 2023-10-18 ENCOUNTER — Ambulatory Visit: Admit: 2023-10-18 | Payer: BLUE CROSS/BLUE SHIELD | Primary: Internal Medicine

## 2023-10-18 ENCOUNTER — Ambulatory Visit
Admit: 2023-10-18 | Discharge: 2023-10-18 | Payer: BLUE CROSS/BLUE SHIELD | Attending: Ophthalmology | Primary: Internal Medicine

## 2023-10-18 DIAGNOSIS — E113299 Type 2 diabetes mellitus with mild nonproliferative diabetic retinopathy without macular edema, unspecified eye: Secondary | ICD-10-CM

## 2023-10-18 NOTE — Progress Notes (Signed)
NP referred by [facility] for diabetic evaluation    Type II Diabetes; mild non proliferative diabetic retinopathy OU without macular edema  Diagnosed > 20 years prior, on insulin  Lab Results   Component Value Date    HGBA1C 7.0 (H) 07/10/2023     Mild NPDR OU on exam today.  No evidence of NVI/NVD/NVE    Discussed continued good BP and BS control as well as diet and weight loss  Letter/exam notes with eye exam findings sent to the doctor responsible for the patient's diabetes management.   F/u in 1 year    Cataracts; both eyes  - not visually significant, observe    Follow up 1 year DFE/OCT OU

## 2023-11-19 ENCOUNTER — Ambulatory Visit
Payer: BLUE CROSS/BLUE SHIELD | Attending: Student in an Organized Health Care Education/Training Program | Primary: Internal Medicine

## 2023-11-30 NOTE — Progress Notes (Signed)
Specialty Pharmacy Refill Coordination Note     [name] is a 48 y.o. female contacted today regarding refills of her specialty medication(s).    Reviewed and verified with patient:                Specialty medication(s) and dose(s) confirmed: yes  Changes to medications: no  Changes to insurance: no    Refill Questions      Flowsheet Row Most Recent Value   Patient Contact Attempts Patient reached   HIPPA Verified? Yes   Specialty Clinics --  [w/w]   Changes to allergies? No   Changes to medications? No   New conditions since last clinic visit No   Unplanned office visit, urgent care, ED, or hospital admission in the last 4 weeks  No   How does patient/caregiver feel medication is working? Good   Financial problems or insurance changes  No            Delivery Questions      Flowsheet Row Most Recent Value   Delivery method FedEx   Delivery address correct? Yes   Preferred delivery time? Anytime   Number of medications in delivery 1  [mounjaro]   Is there any medication that is due not being filled? No   Supplies needed? No supplies needed   Cooler needed? Yes   Do any medications need mixed or dated? No   Copay form of payment Credit card on file   Questions or concerns for the pharmacist? No   Are any medications first time fills? No   Tracking number for delivery [id number]   Shipment status Cooler packed, Delivery complete                   Follow-up: 65 day(s)     Konrad Dolores, PharmD  Specialty Pharmacy

## 2023-12-03 MED FILL — MOUNJARO 5 MG/0.5 ML SUBCUTANEOUS PEN INJECTOR: 5 5 mg/0.5 mL | SUBCUTANEOUS | 28 days supply | Qty: 2 | Fill #1 | Status: CP

## 2024-01-24 ENCOUNTER — Ambulatory Visit
Admit: 2024-01-24 | Discharge: 2024-01-24 | Payer: BLUE CROSS/BLUE SHIELD | Attending: Internal Medicine | Primary: Internal Medicine

## 2024-01-24 DIAGNOSIS — Z1231 Encounter for screening mammogram for malignant neoplasm of breast: Secondary | ICD-10-CM

## 2024-01-24 MED ORDER — sertraline (Zoloft) 50 mg tablet
50 | ORAL_TABLET | Freq: Every day | ORAL | 1 refills | Status: DC
Start: 2024-01-24 — End: 2024-01-24

## 2024-01-24 MED ORDER — blood-glucose sensor (Dexcom G7 Sensor) device
11 refills | 30.00000 days | Status: DC
Start: 2024-01-24 — End: 2024-01-30

## 2024-01-24 MED ORDER — tirzepatide (Mounjaro) 5 mg/0.5 mL pen injector
5 | SUBCUTANEOUS | 11 refills | 28.00000 days | Status: DC
Start: 2024-01-24 — End: 2024-01-30

## 2024-01-24 MED ORDER — sertraline (Zoloft) 50 mg tablet
50 | ORAL_TABLET | Freq: Every day | ORAL | 1 refills | Status: AC
Start: 2024-01-24 — End: ?

## 2024-01-24 NOTE — Progress Notes (Signed)
 [facility] [place]   [address]  [address]  [place] [address]  [phone]     Patient ID:  [name] is a 48 y.o. female who presents for follow up Diabetes       Patient Health Questionnaire-9 Score: 0   Interpretation: Negative screening.     Follow-up & Interventions: Maintain annual screening - No additional Follow-up required  Generalized Anxiety Disorder Screening - GAD-7 Score: 0  Interpretation: Negative screening.  Follow up & Intervention: Maintain annual screening - No additional Follow-up required     Substance Use Screening  Screening, Brief Intervention, and Referral to Treatment:SBIRT Alcohol Score: 1     SBIRT Drugs Score: 0 Negative for Drug Use Disorder    Alcohol Use Disorder Identification Test: Audit Total Score: 2     Low-risk drinking or abstinence.            Follow up & Intervention: Continue to screen annually  Substance Use Screening Time: 1  minutes spent assessing and managing for substance use disorders.              Subjective    HPI   History of Present Illness     Has lost 8 lbs since September  [name] is having issues at school which is hard. [name] is on top of it but it is a process (she has a IEP)   Has no info about sugars/not testing   Has financial issues re: CGM. Also does not like to wear when weather gets warm and people can see it     Only been on Mounjaro for 4 weeks back/ was off for several months   Was off for 2 months--financial issues   Has a HSA, but they don't cover [name] which costs $30    A1c today is 8.5  She would like to stay on the 5 mg for now  Has small amount of soft stool, slight nausea , no vomited   Would like cgm supplies,Dexcom 7   She is off mounjaro as she fills this at [employer], $30 copay  [facility] including [facility]com sensorys (she uses the [facility] card for that)  She has worked with [name] for pharmacy who is currently out maternity   No hypos        Lab Results   Component Value Date    MICROALBCREA <4 01/16/2023    HGBA1C 7.0 (H) 07/10/2023    LDLCALC 89 01/16/2023       Depression--in remission on sertraline 50 mg, even with stressors above she is coping well    Had pap and colpo this year. I am unable to see [place] results. She will get for me. 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     Family History:  family history includes Cataracts in her mother; Diabetes in her mother.     Social History  Tobacco Use    Smoking status: Never   Vaping Use    Vaping status: Never Used   Substance Use Topics    Drug use: Yes     Types: Marijuana     Comment: every other day     Allergies   Allergen Reactions    Penicillin V Potassium Rash     Other reaction(s): rash as child       Current Outpatient Medications   Medication Instructions    atorvastatin (LIPITOR) 40 mg, oral, Daily    blood-glucose sensor (Dexcom G7 Sensor) device Use to scan blood sugars. Change sensor every 10 days.    ergocalciferol (vitamin D2) (VITAMIN D-2) 50,000 Units, oral, Weekly    hydroCHLOROthiazide (HYDRODiuril) 25 mg tablet TAKE 1 TABLET BY MOUTH EVERY DAY    hydrocortisone 2.5 % ointment Apply topically twice daily to affected areas, stop when clear. Use for one week then alternate with protopic for one week.    Lantus Solostar U-100 Insulin 30 Units, subcutaneous, Nightly    Mounjaro 5 mg, subcutaneous, Every 7 days    pen needle, diabetic (BD Nano 2nd Gen Pen Needle) 32 gauge x 5/32" needle Use daily to inject insulin    sertraline (ZOLOFT) 50 mg, oral, Daily    tacrolimus  (Protopic) 0.1 % ointment Apply topically twice daily to affected areas, stop when clear. Alternate with hydrocortisone. Safe to use daily.        Objective    Visit Vitals  BP 118/68   Pulse 99   Temp 36.6 C (97.8 F) (Temporal)   Ht 1.651 m   Wt 86.4 kg   SpO2 99%   BMI 31.68 kg/m   OB Status Postmenopausal   BSA 1.99 m     Wt Readings from Last 5 Encounters:   01/24/24 86.4 kg   07/10/23 89.8 kg   03/29/23 91.7 kg   01/16/23 96 kg   06/13/22 94.8 kg   {    Physical Exam   Physical Exam    NAD            Lab Results   Component Value Date    WBC 7.5 01/16/2023    HGB 11.9 01/16/2023    HCT 36.0 01/16/2023    PLT 329 01/16/2023    CHOL 144 01/16/2023    TRIG 82 01/16/2023    HDL 39 (L) 01/16/2023    LDLCALC 89 01/16/2023    ALT 21 01/16/2023    AST 21 01/16/2023    NA 143 01/16/2023    K 4.3 01/16/2023    CL 104 01/16/2023    CREATININE 0.89 01/16/2023    BUN 12 01/16/2023    EGFR 80 01/16/2023    CO2 22 01/16/2023    TSH 1.100 01/16/2023    HGBA1C 7.0 (H) 07/10/2023    MICROALBUR <3.0 01/16/2023      Results         Assessment/Plan      [name] was seen today for ppd read.  Breast cancer screening by mammogram  -     BI BILATERAL MAMMOGRAM SCREENING TOMOSYNTHESIS; Future  Abnormal glucose  -     Urine Microalbumin  Encounter for screening for malignant neoplasm of colon  -     Cologuard colon cancer screening  Mild depression  -     sertraline (Zoloft) 50 mg tablet; Take 1 tablet (50 mg) by mouth once daily.  Type 2 diabetes mellitus with mild nonproliferative retinopathy without macular  edema, with long-term current use of insulin, unspecified laterality (Multi-HCC)  -     blood-glucose sensor (Dexcom G7 Sensor) device; Use to scan blood sugars. Change sensor every 10 days.  -     tirzepatide (Mounjaro) 5 mg/0.5 mL pen injector; Inject 0.5 mL (5 mg) under the skin every 7 (seven) days.    Assessment & Plan     Diabetes mellitus due to underlying condition with both eyes affected by mild nonproliferative  retinopathy and macular edema, with long-term current use of insulin (CMS/HCC)    improved, now controlled on mounjaro 2.5 mg     12/24 mild NPDR ou ; f/u 1 year    Lantus was 30 mg//mounjaro 5.0 mg --was off mounjaro for a few months--now back on 1 month but a1c 8.5 (was somewhat off mounjaro)  Will recheck a1c in June   No lows now   Advised wear CGM (she doesn't like to wear in summer when people can see) and will intensely get to goal now before the summer      NOT  on ace, neg microalb ; recheck today   NOT on statin when LDL drawn, now back on         Lab Results   Component Value Date     MICROALBCREA <4 01/16/2023     HGBA1C 7.0 (H) 07/10/2023     LDLCALC 89 01/16/2023          Low Vit D now on replacement      Marijuana smo ker  Assessment & Plan:  Advised d/c, occasional use      Horner's syndrome  Assessment & Plan:  Laird seen 12/24 f/u 1 year   Primary hypertension  Assessment & Plan:  Stable on hctz 25; ok  bmp;   High cholesterol  Assessment & Plan:  Restarted atorvastatin/         Unspecified abnormal cytological findings in specimens from cervix uteri  LSIL 2/22 pap, 4/22 benign colpo at [facility]. 1 year cotest f/u advised --done with recent colpo (around 04/2022//will need results) told 1 year f/u cotest advised ; Pap at [facility] 5/24 with f/u colpo--I don't have these results, she will get for me             mild depression/mild anxiety/lack of motivation  Ambivalent about therapy, discussed synchronous she will consider   Now improved on zoloft  25 mg          eczema -f/b derm      Last CE: 5/15; 1/17; 4/18 ; 12/19; 9/21 ; 12/22; 9/24      Pap: /10 ascus; colpo neg; 12/11 colpo lsil; 6/12 ascus, hpv not done; 4/13 pap neg, hr hpv pos, colpo neg; 5/14 pap/hpv neg;  pap May 2015-neg pap, did not receive hpv results; 2/22 LSIL , hpv pos, colpo benign; 1 year cotest advised 2023 pap abnormal colpo 2023/recent, "negative" per pt; 1 year cotest 5 /24 can't see result, also had colpo/// they told her 1 year  follow up      Mammo: 06/2022 ; order  Colo:order cologuard Rene Kocher not complete; will reorder   BMD:ni  Eye care DUE, does at [facility] pending for June 2024   Dental: utd  Choleseterol         Risk Score Cholesterol: on high intensity statin  Tdap: 2012,2018  MMR: vax 1978, 1994  Varicella:kid  Hep B:3 vax  Pvax:2011  Flu 2015, 2016;2019; got 2022,24  Zoster:ni  HPV:ni  covid vax  x 3; 24/25 booster             A total of 33  minutes was spent on this encounter, reviewing previous records, performing the exam, counseling/education of the patient regarding treatment plans and clinical documentation into the EMR.        Follow up in about 6 weeks (around 03/03/2024) for 10:20 AM video visit//also book June in person vollow up with me .

## 2024-01-25 LAB — CBC
Hct: 36.7 % (ref 34.0–46.6)
Hgb: 12 g/dL (ref 11.1–15.9)
MCH: 30.3 pg (ref 26.6–33.0)
MCHC: 32.7 g/dL (ref 31.5–35.7)
MCV: 93 fL (ref 79–97)
Platelets: 276 10*3/uL (ref 150–450)
RBC: 3.96 x10E6/uL (ref 3.77–5.28)
RDW: 14.1 % (ref 11.7–15.4)
WBC: 6 10*3/uL (ref 3.4–10.8)

## 2024-01-25 LAB — COMPREHENSIVE METABOLIC PANEL
ALT: 20 IU/L (ref 0–32)
AST: 23 IU/L (ref 0–40)
Albumin: 4.2 g/dL (ref 3.9–4.9)
Alk Phosphatase: 125 IU/L — ABNORMAL HIGH (ref 44–121)
Anion Gap: 16 mmol/L (ref 10.0–18.0)
BUN/Creat Ratio: 16 (ref 9–23)
BUN: 15 mg/dL (ref 6–24)
Bili Total: 0.2 mg/dL (ref 0.0–1.2)
Calcium: 9.7 mg/dL (ref 8.7–10.2)
Carbon Dioxide: 23 mmol/L (ref 20–29)
Chloride: 103 mmol/L (ref 96–106)
Creat: 0.92 mg/dL (ref 0.57–1.00)
Globulin Total: 2.5 g/dL (ref 1.5–4.5)
Glucose: 81 mg/dL (ref 70–99)
Potassium: 3.9 mmol/L (ref 3.5–5.2)
Protein Total: 6.7 g/dL (ref 6.0–8.5)
Sodium: 142 mmol/L (ref 134–144)
eGFR: 77 mL/min/{1.73_m2} (ref >59)

## 2024-01-25 LAB — LIPID PANEL
Cholesterol: 101 mg/dL (ref 100–199)
HDL Cholesterol: 44 mg/dL (ref >39)
LDLc Calc (NIH): 45 mg/dL (ref 0–99)
Non-HDL Chol: 57 mg/dL (ref 0–129)
Triglycerides: 49 mg/dL (ref 0–149)
VLDLc Calc: 12 mg/dL (ref 5–40)

## 2024-01-25 LAB — POCT HEMOGLOBIN A1C: POCT Hemoglobin A1C: 8.5 % — ABNORMAL HIGH (ref <=5.7)

## 2024-01-25 NOTE — Progress Notes (Signed)
 ggt

## 2024-01-26 LAB — VITAMIN D 25 HYDROXY: Vitamin D, 25-Hydroxy: 78.7 ng/mL (ref 30.0–100.0)

## 2024-01-26 LAB — ALBUMIN, URINE, RANDOM (INCLUDING CREATININE)
Alb/Creat Ratio: <2 mg/g{creat} (ref 0–29)
Albumin Ur: <3 ug/mL
Creat Ur: 163.6 mg/dL

## 2024-01-27 LAB — GGT: GGT: 24 IU/L (ref 0–60)

## 2024-01-30 MED ORDER — tirzepatide (Mounjaro) 5 mg/0.5 mL pen injector
5 | SUBCUTANEOUS | 3 refills | 28.00000 days | Status: DC
Start: 2024-01-30 — End: 2024-02-08

## 2024-01-30 MED ORDER — Dexcom G7 Sensor device
3 refills | Status: AC
Start: 2024-01-30 — End: ?

## 2024-01-30 NOTE — Telephone Encounter (Signed)
 With [name], patient can use the [facility] copay card for $25 for a 3 month supply.    Called patient, advised of above and signed her up for the copay card. Restricted to 1 month supply at [facility], [facility]. She will let us know if any issues. Also resent Dexcom G7 as 3 month supply per her request.    [name], PharmD, BCACP, RPh  01/30/24 9:48 AM

## 2024-01-30 NOTE — Telephone Encounter (Signed)
-----   Message from Rew, PharmD sent at 01/29/2024  4:05 PM EDT -----  Merril Abbe,     Did Dr. [name] message you about this patient at all?     -Bre  ----- Message -----  From: Tonita Cong, RN  Sent: 01/24/2024   9:37 AM EDT  To: Venda Rodes, PharmD; #    Good morning [name],  I see she is involved with the Rx Med optimization program with [place]. I would recommend you send a referral to the pharmacy pool for assistance, which is what I would have done if this patient was referred for CM. I am including others on the thread in case my recommendation is incorrect.  Thank you!  Darl Pikes  ----- Message -----  From: [name], MD  Sent: 01/24/2024   9:09 AM EDT  To: Tonita Cong, RN    Juliann Pulse,    I'm wondering if you have thoughts on how to help [name] who has diabetes. She is on multiple meds and has been off mounjaro for a few months due to the $30 copay at [facility] specialty pharmacy/financial struggles. She is a single mom, stressed out with a kid not doing well in school (kindergarteN0 and has a lot on her plate. She has a Health care spending acct that [facility] but only gets the mounjaro at [place] when she has the cash. Her a1c today is 8.5    Any thoughts on how to help her?     [name]

## 2024-02-08 MED ORDER — tirzepatide (Mounjaro) 5 mg/0.5 mL pen injector
5 | SUBCUTANEOUS | 3 refills | 28.00000 days | Status: AC
Start: 2024-02-08 — End: ?

## 2024-02-08 NOTE — Progress Notes (Signed)
 Mounjaro 5 mg/0.5 ml pen injector    Patient contacted [facility] stating she was attempting to [facility] and was told she cannot fill it until 4/16.     After further review, a previous claim was processed and I reversed Mounjaro claim to allow [facility] to re[facility] RX. I advised patient I will call [facility] and have them reprocess it, if I have no issues with it she can expect a notification from [facility] when RX is. If I am having problems patient aware I will then call.     I [facility] to [facility] and had them reprocess [facility], claim was successful and they will have to order medication as it is out of stock and will be ready for pick up on 4/7 (Monday) Patient updated.    Jahvon Gosline, PharmD  02/08/24   11:07 AM

## 2024-02-22 NOTE — Telephone Encounter (Signed)
 The [place] pharmacy has now transferred the Mounjaro  prescription [facility].

## 2024-02-22 NOTE — Telephone Encounter (Signed)
 [facility] stated that patient called inquiring on the following medication  tirzepatide  (Mounjaro ) 5 mg/0.5 mL pen injector which was called in on 02/08/24 but they haven't received the script as based on file as well it was sent to a different pharmacy.  I did explain this which she would like patient to be informed about.       Also, patient was very disrespectful towards them she stated and in the future she explained they could refuse services  if this continues. Please advise.       Call back [phone]

## 2024-02-25 LAB — LAB COLOGUARD COLON CANCER SCREEN: COLOGUARD: NEGATIVE

## 2024-03-03 ENCOUNTER — Ambulatory Visit: Payer: BLUE CROSS/BLUE SHIELD | Attending: Internal Medicine | Primary: Internal Medicine

## 2024-03-03 NOTE — Progress Notes (Unsigned)
 [facility] [place]   [address]  [address]  [place] [address]  [phone]     Patient ID:  [name] is a 48 y.o. female who presents for telehealth follow up   Subjective    HPI       SCREENING:                          History of Present Illness       Problem List[1]   Surgical History[2]     Family History:  family history includes Cataracts in her mother; Diabetes in her mother.     Social History[3]  Allergies[4]  Current Outpatient Medications   Medication Instructions    atorvastatin (LIPITOR) 40 mg, oral, Daily    Dexcom G7 Sensor device Use to scan blood sugars. Change sensor every 10 days.    ergocalciferol (vitamin D2) (VITAMIN D-2) 50,000 Units, oral, Weekly    hydroCHLOROthiazide (HYDRODiuril) 25 mg tablet TAKE 1 TABLET BY MOUTH EVERY DAY    hydrocortisone 2.5 % ointment Apply topically twice daily to affected areas, stop when clear. Use for one week then alternate with protopic for one week.    insulin glargine (Lantus Solostar U-100 Insulin) 100 unit/mL (3 mL) injection INJECT 36 UNITS UNDER THE SKIN IN THE MORNING.    Mounjaro 5 mg, subcutaneous, Every 7 days    pen needle, diabetic (BD Nano 2nd Gen Pen Needle) 32 gauge x 5/32" needle Use daily to inject insulin    sertraline (ZOLOFT) 50 mg, oral, Daily    tacrolimus (Protopic) 0.1 % ointment Apply topically twice daily to affected areas, stop when clear. Alternate with hydrocortisone. Safe to use daily.        Objective    There were no vitals taken for this visit.    Physical Exam           Lab Results   Component Value Date    WBC 6.0 01/24/2024    HGB 12.0 01/24/2024    HCT 36.7 01/24/2024    PLT 276 01/24/2024    CHOL 101 01/24/2024    TRIG 49 01/24/2024    HDL 44 01/24/2024    LDLCALC 45 01/24/2024    ALT 20 01/24/2024    AST 23 01/24/2024    NA 142 01/24/2024    K 3.9 01/24/2024    CL 103 01/24/2024    CREATININE 0.92 01/24/2024    BUN 15 01/24/2024    EGFR 77 01/24/2024    CO2 23 01/24/2024    TSH 1.100  01/16/2023    HGBA1C 8.5 (H) 01/24/2024    MICROALBUR <3.0 01/24/2024          Assessment/Plan      There are no diagnoses linked to this encounter.  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, with long-term current use of insulin (CMS/HCC)    improved, now controlled on mounjaro 2.5 mg     12/24 mild NPDR ou ; f/u 1 year    Lantus was 30 mg//mounjaro 5.0 mg --was off mounjaro for a few months--now back on 1 month but a1c 8.5 (was somewhat off mounjaro)  Will recheck a1c in June   No lows now   Advised wear CGM (she doesn't like to wear in summer when people can see) and will intensely get to goal  now before the summer      NOT  on ace, neg microalb ; recheck today   NOT on statin when LDL drawn, now back on             Lab Results   Component Value Date     MICROALBCREA <4 01/16/2023     HGBA1C 7.0 (H) 07/10/2023     LDLCALC 89 01/16/2023          Low Vit D now on replacement      Marijuana smo ker  Assessment & Plan:  Advised d/c, occasional use      Horner's syndrome  Assessment & Plan:  Plum seen 12/24 f/u 1 year   Primary hypertension  Assessment & Plan:  Stable on hctz 25; ok  bmp;   High cholesterol  Assessment & Plan:  Restarted atorvastatin/         Unspecified abnormal cytological findings in specimens from cervix uteri  LSIL 2/22 pap, 4/22 benign colpo [facility]. 1 year cotest f/u advised --done with recent colpo (around 04/2022//will need results) told 1 year f/u cotest advised ; Pap at [facility] 5/24 with f/u colpo--I don't have these results, she will get for me             mild depression/mild anxiety/lack of motivation  Ambivalent about therapy, discussed synchronous she will consider   Now improved on zoloft  25 mg          eczema -f/b derm      Last CE: 5/15; 1/17; 4/18 ; 12/19; 9/21 ; 12/22; 9/24      Pap: /10 ascus; colpo neg; 12/11 colpo lsil; 6/12 ascus, hpv not done; 4/13 pap neg, hr hpv pos,  colpo neg; 5/14 pap/hpv neg;  pap May 2015-neg pap, did not receive hpv results; 2/22 LSIL , hpv pos, colpo benign; 1 year cotest advised 2023 pap abnormal colpo 2023/recent, "negative" per pt; 1 year cotest 5 /24 can't see result, also had colpo/// they told her 1 year follow up      Mammo: 06/2022 ; order  Colo:order cologuard [facility] not complete; will reorder   BMD:ni  Eye care DUE, does at [facility] pending for June 2024   Dental: utd  Choleseterol         Risk Score Cholesterol: on high intensity statin  Tdap: 2012,2018  MMR: vax 1978, 1994  Varicella:kid  Hep B:3 vax  Pvax:2011  Flu 2015, 2016;2019; got 2022,24  Zoster:ni  HPV:ni  covid vax x 3; 24/25 booster             No follow-ups on file.      This real-time, interactive virtual Telehealth encounter was done by {VideoPhone:29232} with the patient's verbal consent. Two patient identifiers were used and confirmed. Physical location of the patient: {HOME APARTMENT:32745::"home"} Patient resides in: [place]  Physical location of the provider: {OFFICE:29233::"office"}. 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  [  3]   Social History  Tobacco Use    Smoking status: Never   Vaping Use    Vaping status: Never Used   Substance Use Topics    Drug use: Yes     Types: Marijuana     Comment: every other day   [4]   Allergies  Allergen Reactions    Penicillin V Potassium Rash     Other reaction(s): rash as child

## 2024-03-03 NOTE — Telephone Encounter (Signed)
-----   Message from [place] sent at 03/03/2024 11:08 AM EDT -----  LVM.  ----- Message -----  From: Rainell Buoy, MD  Sent: 03/03/2024  10:29 AM EDT  To: Theron Flavin    No show, please reach out to reschedule televisit with me. J

## 2024-05-01 ENCOUNTER — Ambulatory Visit: Admit: 2024-05-01 | Payer: BLUE CROSS/BLUE SHIELD | Attending: Internal Medicine | Primary: Internal Medicine

## 2024-05-01 DIAGNOSIS — R7989 Other specified abnormal findings of blood chemistry: Secondary | ICD-10-CM

## 2024-05-01 LAB — POCT HEMOGLOBIN A1C: POCT Hemoglobin A1C: 7.1 % — ABNORMAL HIGH (ref <=5.7)

## 2024-05-01 MED ORDER — blood-glucose sensor (Dexcom G6 Sensor) device
11 refills | 30.00000 days | Status: DC
Start: 2024-05-01 — End: 2024-06-27

## 2024-05-01 MED ORDER — cholecalciferol (Vitamin D-3) 25 MCG (1000 UT) capsule
25 | Freq: Every day | ORAL | 3 refills | 60.00000 days | Status: AC
Start: 2024-05-01 — End: ?

## 2024-05-01 NOTE — Progress Notes (Signed)
 [facility] [place]   [address]  [address]  [place] [address]  [phone]     Patient ID:  [name] is a 48 y.o. female who presents for follow up             Subjective    HPI   History of Present Illness  DM    A1c is much better today !  Wearing CGM -just put it on though, needs to download software/isn't working yet  Mounjaro  5 mg (has only been on 5 mg for 2 weeks)     Mild nausea only for side effects, moving bowels   No low sugars  Lantus  30 units  Not curerently checking sugars     G7 is CGM she got  Her current phone may not be compatible with it   Was compatible with the g6    Weight -she would like to lose more but overall is down 10 lbs from last year       Wt Readings from Last 9 Encounters:   05/01/24 87.1 kg   01/24/24 86.4 kg   07/10/23 89.8 kg   03/29/23 91.7 kg   01/16/23 96 kg   06/13/22 94.8 kg   05/16/22 97.5 kg   11/14/21 100 kg   10/13/21 96.6 kg   {    Lab Results   Component Value Date    HGBA1C 7.1 (H) 05/01/2024       Pap-had colpo 1/25, told 1 year f/u  Willing to get me labs    Left wrist ganglion cyst-was bothering h er, would like checked o ut      Problem List[1]   Surgical History[2]     Family History:  family history includes Cataracts in her mother; Diabetes in her mother.     Social History[3]  Allergies[4]  Current Outpatient Medications   Medication Instructions    atorvastatin  (LIPITOR) 40 mg, oral, Daily    blood-glucose sensor (Dexcom G6 Sensor) device Use one sensory every 10 days    cholecalciferol  (VITAMIN D -3) 25 mcg, oral, Daily    Dexcom G7 Sensor device Use to scan blood sugars. Change sensor every 10 days.    hydroCHLOROthiazide  (HYDRODiuril ) 25 mg tablet TAKE 1 TABLET BY MOUTH EVERY DAY    hydrocortisone  2.5 % ointment Apply topically twice daily to affected areas, stop when clear. Use for one week then alternate with protopic  for one week.    insulin  glargine (Lantus  Solostar U-100 Insulin ) 100 unit/mL (3 mL) injection INJECT  36 UNITS UNDER THE SKIN IN THE MORNING.    Mounjaro  5 mg, subcutaneous, Every 7 days    pen needle, diabetic (BD Nano 2nd Gen Pen Needle) 32 gauge x 5/32 needle Use daily to inject insulin     sertraline  (ZOLOFT ) 50 mg, oral, Daily    tacrolimus  (Protopic ) 0.1 % ointment Apply topically twice daily to affected areas, stop when clear. Alternate with hydrocortisone . Safe to use daily.        Objective    Visit Vitals  BP 116/70   Pulse 88   Temp 36.6 C (97.8 F) (Temporal)   Ht 1.651 m   Wt 87.1 kg   SpO2 100%   BMI 31.95 kg/m   OB Status Postmenopausal   BSA 2 m     Wt Readings from Last 5 Encounters:   05/01/24 87.1 kg   01/24/24 86.4 kg   07/10/23 89.8 kg   03/29/23 91.7 kg  01/16/23 96 kg   {    Physical Exam   Physical Exam       NAD  Left wrist-ganglion cyst, mobile      Lab Results   Component Value Date    WBC 6.0 01/24/2024    HGB 12.0 01/24/2024    HCT 36.7 01/24/2024    PLT 276 01/24/2024    CHOL 101 01/24/2024    TRIG 49 01/24/2024    HDL 44 01/24/2024    LDLCALC 45 01/24/2024    ALT 20 01/24/2024    AST 23 01/24/2024    NA 142 01/24/2024    K 3.9 01/24/2024    CL 103 01/24/2024    CREATININE 0.92 01/24/2024    BUN 15 01/24/2024    EGFR 77 01/24/2024    CO2 23 01/24/2024    TSH 1.100 01/16/2023    HGBA1C 7.1 (H) 05/01/2024    MICROALBUR <3.0 01/24/2024      Results         Assessment/Plan      Shareese was seen today for follow-up.  Low vitamin D  level  -     cholecalciferol  (Vitamin D -3) 25 MCG (1000 UT) capsule; Take 1 capsule (25 mcg) by mouth once daily.  Type 2 diabetes mellitus with mild nonproliferative retinopathy without macular edema, with long-term current use of insulin , unspecified laterality (Multi-HCC)  -     blood-glucose sensor (Dexcom G6 Sensor) device; Use one sensory every 10 days  -     INT  [facility] (T PODIATRY); Future  Ganglion cyst  -     INT  [facility] (T HAND SVC); Future  Other orders  -     POCT glycosylated hemoglobin (HBG  A1C)    Assessment & Plan    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, with long-term current use of insulin  (CMS/HCC)    improved, now controlled on mounjaro  2.5 mg     12/24 mild NPDR ou ; f/u 1 year    Lantus  30 mg//mounjaro  5.0 mg a1c is 7.1  Discussed increasing mounjaro /decreasing lantus  but she would like to hold here for now     Advised wear CGM--will switch to G6 since G7 not compatible with her phone  Advied ANY low sugars to decrease lantus  to 28 U and call     NOT  on ace, neg microalb ;   back on statin    Lab Results   Component Value Date    MICROALBCREA <2 01/24/2024    HGBA1C 7.1 (H) 05/01/2024    LDLCALC 45 01/24/2024            Low Vit D , switch to maintenance replacement     Marijuana smoker  Assessment & Plan:  Advised d/c, occasional use      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. 1 year cotest f/u advised --done  with recent colpo (around 04/2022//will need results) told 1 year f/u cotest advised ; [facility] 5/24 with f/u 11/2023 colpo--I don't have these results, she will get for me ; needs f/u pap Jan 2026       ganglion cyst -left wrist; d/w pt, is right handed; not hurting right now       mild depression/mild anxiety/lack of motivation  Ambivalent about therapy, discussed synchronous she will consider   Now improved on zoloft   25 mg          eczema -f/b derm     Ganglion cyst-refer to hand      Last CE: 5/15; 1/17; 4/18 ; 12/19; 9/21 ; 12/22; 9/24      Pap: /10 ascus; colpo neg; 12/11 colpo lsil; 6/12 ascus, hpv not  done; 4/13 pap neg, hr hpv pos, colpo neg; 5/14 pap/hpv neg;  pap May 2015-neg pap, 2/22 LSIL , hpv pos, colpo benign; 1 year cotest advised 2023 pap abnormal colpo 2023/recent, negative per pt; 1 year cotest 5 /24 can't see result, also had 11/2023 colpo/// they told her 1 year follow up due 11/2024     Mammo: 06/2022 ; ordered-she will book , number given today   Colo:order cologuard4/25 negative  BMD:ni  Eye care 12/24 with mild DR , 1 year f/u   Dental: utd  Choleseterol         Risk Score Cholesterol: on high intensity statin  Tdap: 2012,2018  MMR: vax 1978, 1994  Varicella:kid  Hep B:3 vax  Pvax:2011  Flu 2015, 2016;2019; got 2022,24  Zoster:ni  HPV:ni  covid vax x 3; 24/25 booster             Follow up in about 3 months (around 08/01/2024) for annual exam when available   ; lets book september video also with me for f/u .         [1]   Patient Active Problem List  Diagnosis    Unspecified abnormal cytological findings in specimens from cervix uteri    Type 2 diabetes mellitus with unspecified diabetic retinopathy without macular edema (Multi-HCC)    Horner's syndrome    Bilateral carpal tunnel syndrome    High cholesterol     Hypertension     Hidradenitis    Marijuana smoker    Depression, major, in remission    [2] No past surgical history on file.  [3]   Social History  Tobacco Use    Smoking status: Never   Vaping Use    Vaping status: Never Used   Substance Use Topics    Drug use: Yes     Types: Marijuana     Comment: every other day   [4]   Allergies  Allergen Reactions    Penicillin V Potassium Rash     Other reaction(s): rash as child

## 2024-05-23 MED ORDER — tirzepatide (Mounjaro) 5 mg/0.5 mL pen injector
5 | SUBCUTANEOUS | 3 refills | 28.00000 days | Status: AC
Start: 2024-05-23 — End: ?
  Filled 2024-05-24: qty 2, 28d supply, fill #0

## 2024-06-02 ENCOUNTER — Ambulatory Visit
Admit: 2024-06-02 | Discharge: 2024-06-02 | Payer: BLUE CROSS/BLUE SHIELD | Attending: Internal Medicine | Primary: Internal Medicine

## 2024-06-02 DIAGNOSIS — Z794 Long term (current) use of insulin: Principal | ICD-10-CM

## 2024-06-02 MED ORDER — blood-glucose sensor (Dexcom G6 Sensor) device
3 refills | 30.00000 days | Status: DC
Start: 2024-06-02 — End: 2024-06-27

## 2024-06-02 NOTE — Progress Notes (Signed)
 [facility] [place]   [address]  [address]  [place] [address]  [phone]     Patient ID:  [name] is a 48 y.o. female who presents for telehealth follow up   Subjective    HPI       History of Present Illness  Lab Results   Component Value Date    HGBA1C 7.1 (H) 05/01/2024     Didn't get G6 CGM, they gave her G7   G7 can't use because it doesn't work with her phone   Gets it from [facility]   No symptomatic lows   Thinks gained a lb or two  Mounjaro  on 5 mg , Lantus  30 U      Daughter is in camp this summer, still some whining.   Problem List[1]   Surgical History[2]     Family History:  family history includes Cataracts in her mother; Diabetes in her mother.     Social History[3]  Allergies[4]  Current Outpatient Medications   Medication Instructions    atorvastatin  (LIPITOR) 40 mg, oral, Daily    blood-glucose sensor (Dexcom G6 Sensor) device Use one sensory every 10 days    blood-glucose sensor (Dexcom G6 Sensor) device Use CGM sensory every 10 days    cholecalciferol  (VITAMIN D -3) 25 mcg, oral, Daily    hydroCHLOROthiazide  (HYDRODiuril ) 25 mg tablet TAKE 1 TABLET BY MOUTH EVERY DAY    hydrocortisone  2.5 % ointment Apply topically twice daily to affected areas, stop when clear. Use for one week then alternate with protopic  for one week.    insulin  glargine (Lantus  Solostar U-100 Insulin ) 100 unit/mL (3 mL) injection INJECT 36 UNITS UNDER THE SKIN IN THE MORNING.    Mounjaro  5 mg, subcutaneous, Every 7 days    pen needle, diabetic (BD Nano 2nd Gen Pen Needle) 32 gauge x 5/32 needle Use daily to inject insulin     sertraline  (ZOLOFT ) 50 mg, oral, Daily    tacrolimus  (Protopic ) 0.1 % ointment Apply topically twice daily to affected areas, stop when clear. Alternate with hydrocortisone . Safe to use daily.        Objective    There were no vitals taken for this visit.    Physical Exam     NAD       Lab Results   Component Value Date    WBC 6.0 01/24/2024    HGB 12.0 01/24/2024     HCT 36.7 01/24/2024    PLT 276 01/24/2024    CHOL 101 01/24/2024    TRIG 49 01/24/2024    HDL 44 01/24/2024    LDLCALC 45 01/24/2024    ALT 20 01/24/2024    AST 23 01/24/2024    NA 142 01/24/2024    K 3.9 01/24/2024    CL 103 01/24/2024    CREATININE 0.92 01/24/2024    BUN 15 01/24/2024    EGFR 77 01/24/2024    CO2 23 01/24/2024    TSH 1.100 01/16/2023    HGBA1C 7.1 (H) 05/01/2024    MICROALBUR <3.0 01/24/2024          Assessment/Plan      Type 2 diabetes mellitus with mild nonproliferative retinopathy without macular edema, with long-term current use of insulin , unspecified laterality (Multi-HCC)  -     blood-glucose sensor (Dexcom G6 Sensor) device; Use CGM sensory every 10 days    Assessment & Plan      Diabetes mellitus due to underlying condition with both eyes affected  by mild nonproliferative retinopathy and macular edema, with long-term current use of insulin  (CMS/HCC)    improved, now controlled on mounjaro  2.5 mg     12/24 mild NPDR ou ; f/u 1 year    Lantus  30 mg//mounjaro  5.0 mg a1c is 7.1  Discussed increasing mounjaro /decreasing lantus  but she would like to hold here for now --wait until CGM on      Advised wear CGM--will switch to G6 since G7 not compatible with her phone   Got G7 from pharmacy--she is going to try to return to the pharmacy and if issue to call her   Advied ANY low sugars to decrease lantus  to 28 U and call     NOT  on ace, neg microalb ;   back on statin           Lab Results   Component Value Date     MICROALBCREA <2 01/24/2024     HGBA1C 7.1 (H) 05/01/2024     LDLCALC 45 01/24/2024             Low Vit D , switch to maintenance replacement     Marijuana smoker  Assessment & Plan:  Advised d/c, occasional use      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 1 year cotest f/u advised --done with recent colpo (around 04/2022//will need results) told 1 year f/u cotest advised ; Pap at [facility] 5/24 with f/u 11/2023 colpo--I don't have these results, she will get for me ; needs f/u pap Jan 2026       ganglion cyst -left wrist; d/w pt, is right handed; not hurting right now       mild depression/mild anxiety/lack of motivation  Ambivalent about therapy, discussed synchronous she will consider   Now improved on zoloft   25 mg          eczema -f/b derm      Ganglion cyst-refer to hand      Last CE: 5/15; 1/17; 4/18 ; 12/19; 9/21 ; 12/22; 9/24 ; 10/25 booked      Pap: /10 ascus; colpo neg; 12/11 colpo lsil; 6/12 ascus, hpv not done; 4/13 pap neg, hr hpv pos, colpo neg; 5/14 pap/hpv neg;  pap May 2015-neg pap, 2/22 LSIL , hpv pos, colpo benign; 1 year cotest advised 2023 pap abnormal colpo 2023/recent, negative per pt; 1 year cotest 5 /24 can't see result, also had 11/2023 colpo/// they told her 1 year follow up due 11/2024     Mammo: 06/2022 ; ordered-she will book , number given today  again   Colo:order cologuard4/25 negative  BMD:ni  Eye care 12/24 with mild DR , 1 year f/u   Dental: utd  Choleseterol         Risk Score Cholesterol: on high intensity statin  Tdap: 2012,2018  MMR: vax 1978, 1994  Varicella:kid  Hep B:3 vax  Pvax:2011  Flu 2015, 2016;2019; got 2022,24  Zoster:ni  HPV:ni  covid  vax x 3; 24/25 booster              Has f/u appt with me 08/12/24  Follow up in about 7 weeks (around 07/21/2024) for change 10/7 to 40 min appt (even if overbooked) ; video 10:20 9/15.      This real-time, interactive virtual Telehealth encounter was done by video with the patient's verbal consent. with the patient's verbal consent. Two patient identifiers were used and confirmed. Physical location of the patient: home Patient resides in:  [place]  Physical location of the provider: home. Other participants/involvement: none  Total minutes spent: 20           [1]   Patient Active Problem List  Diagnosis    Unspecified abnormal cytological findings in specimens from cervix uteri    Type 2 diabetes mellitus with unspecified diabetic retinopathy without macular edema (Multi-HCC)    Horner's syndrome    Bilateral carpal tunnel syndrome    High cholesterol     Hypertension     Hidradenitis    Marijuana smoker    Depression, major, in remission    [2] No past surgical history on [name].  [3]   Social History  Tobacco Use    Smoking status: Never   Vaping Use    Vaping status: Never Used   Substance Use Topics    Drug use: Yes     Types: Marijuana     Comment: every other day   [4]   Allergies  Allergen Reactions    Penicillin V Potassium Rash     Other reaction(s): rash as child

## 2024-06-04 NOTE — Telephone Encounter (Signed)
-----   Message from Dr. Mliss Calamity, MD sent at 06/02/2024 10:23 AM EDT -----  [name] got G7 from pharmacy but g7 doesn't work with her phone only g6 does.   She is going to try to return to the pharmacy.   Can you check in with her this week to see if she was able to return or needs more help?   Thank you!!!  Mliss

## 2024-06-04 NOTE — Telephone Encounter (Addendum)
 Dispense report shows patient has been receiving Dexcom G7 sensors since 06/09/22.    -7/30: Called patient, no answer, left voicemail with direct callback number  -7/31: Called patient, no answer, left voicemail with direct callback number  -8/22: Called patient, confirmed she has the G7 sensors, just filled a 3 month supply. She has a [employer] Note 9 phone, I confirmed this is not compatible with the G7 app (older phone model). She thinks her tablet can download the G7 app, but this will be difficult to keep with her at all times. I advised the Dexcom G7 receiver is covered and is much smaller to carry around. She understood and prefers to use the Dexcom G7 receiver with her G7 sensors. I sent the prescription. She will callback if any issues pairing the receiver and sensors.    Oneil Buzzard, PharmD, BCACP, RPh  06/27/24 9:37 AM

## 2024-06-10 ENCOUNTER — Ambulatory Visit
Admit: 2024-06-10 | Discharge: 2024-06-10 | Payer: BLUE CROSS/BLUE SHIELD | Attending: Hand Surgery | Primary: Internal Medicine

## 2024-06-10 DIAGNOSIS — M6740 Ganglion, unspecified site: Principal | ICD-10-CM

## 2024-06-10 DIAGNOSIS — M67432 Ganglion, left wrist: Principal | ICD-10-CM

## 2024-06-10 NOTE — Progress Notes (Signed)
 [facility] Note    Patient Name: [name]  MRN: [id number]    Chief Complaint: Left wrist volar ganglion cyst    History of Present Illness:  [name] is a 48 y.o. 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 Pain at cyst during wrist flexion and heavy lifting. Denies erythema, warmth, drainage.     Also reports bilateral numbness and tingling in her hands, most severe over the last few months. Left worse than right. Wakes her up at night. Has hx of CTS during pregnancy, received multiple injections which helped at that time. Pain and numbness is severe when doing activities like washing dishes. Is interested in surgical removal of her ganglion cyst even if it does not improve her numbness/tingling symptoms.     Allergies:  Allergies[1]    Medical History:  Medical History[2]    Surgical History:  Surgical History[3]    Current Medications:  Current Medications[4]    Social history:  Tobacco Use: Unknown (06/10/2024)    Patient History     Smoking Tobacco Use: Never     Smokeless Tobacco Use: Unknown     Passive Exposure: Not on file     Alcohol Use: Not At Risk (01/24/2024)    AUDIT-C     Frequency of Alcohol Consumption: Monthly or less     Average Number of Drinks: 1 or 2     Frequency of Binge Drinking: Less than monthly       Physical Exam  Gen: well-appearing, in NAD, A&O    Left wrist focused exam:   Skin is intact. No swelling or ecchymosis.  1cm x 1cm well circumscribed, firm, mobile, non-TTP, volar radial location over the radiocarpal joint.   There is no no intrinsic or thenar atrophy. Sensation is grossly intact in the median, radial and ulnar distribution.  Fires EPL, FPL and interosseous. Fingers are warm and well-perfused.'s test shows good collateral perfusion.    + carpal compression and + Tinel's at the wrist bilaterally/            Diagnosis Plan   1. Ganglion  cyst  INT  [facility] (T HAND SVC)          Assessment/Plan:  [name] is a 48 y.o. female with history of T2DM (on insulin ), HTN, presents for evaluation of left volar wrist ganglion cyst for 10+ years. Additionally, she has symptoms of bilateral carpal tunnel syndrome. Dr. Valery evaluated the patient, and discussed the diagnosis and treatment plans for both her volar ganglion cyst and bilateral carpal tunnel syndrome. The patient would like to proceed with a left volar ganglion cyst removal and left carpal tunnel release. It is also recommended that she start nighttime bracing of her right wrist. All questions were answered, and the patient agreed with the treatment plan. She will be scheduled for a left volar ganglion mass excision with carpal tunnel release.     Elsie Bateman, MS4   Attending: Dr. Valery          [1]   Allergies  Allergen Reactions    Penicillin V Potassium Rash     Other reaction(s): rash as child     [2]   Past Medical History:  Diagnosis Date    Diabetes mellitus (Multi-HCC)    [3] History reviewed. No pertinent surgical history.  [4]  Current Outpatient Medications   Medication Sig Dispense Refill    atorvastatin  (Lipitor) 40 mg tablet Take 1 tablet (40 mg) by mouth once daily. 90 tablet 3    blood-glucose sensor (Dexcom G6 Sensor) device Use one sensory every 10 days 1 each 11    blood-glucose sensor (Dexcom G6 Sensor) device Use CGM sensory every 10 days 5 each 3    cholecalciferol  (Vitamin D -3) 25 MCG (1000 UT) capsule Take 1 capsule (25 mcg) by mouth once daily. 90 each 3    hydroCHLOROthiazide  (HYDRODiuril ) 25 mg tablet TAKE 1 TABLET BY MOUTH EVERY DAY 90 tablet 2    hydrocortisone  2.5 % ointment Apply topically twice daily to affected areas, stop when clear. Use for one week then alternate with protopic  for one week. 30 g 2    insulin  glargine (Lantus  Solostar U-100 Insulin ) 100 unit/mL (3 mL) injection INJECT 36 UNITS UNDER THE SKIN IN THE MORNING. 90  mL 3    pen needle, diabetic (BD Nano 2nd Gen Pen Needle) 32 gauge x 5/32 needle Use daily to inject insulin  100 each 3    sertraline  (Zoloft ) 50 mg tablet Take 1 tablet (50 mg) by mouth once daily. 90 tablet 1    tacrolimus  (Protopic ) 0.1 % ointment Apply topically twice daily to affected areas, stop when clear. Alternate with hydrocortisone . Safe to use daily. 60 g 2    tirzepatide  (Mounjaro ) 5 mg/0.5 mL pen injector Inject 0.5 mL (5 mg) under the skin every 7 (seven) days. 6 mL 3     No current facility-administered medications for this visit.

## 2024-06-27 MED ORDER — Dexcom G7 Sensor device
3 refills | Status: AC
Start: 2024-06-27 — End: ?

## 2024-06-27 MED ORDER — Dexcom G7 Receiver misc
1 refills | 32.00000 days | Status: AC
Start: 2024-06-27 — End: ?

## 2024-07-21 ENCOUNTER — Ambulatory Visit: Payer: BLUE CROSS/BLUE SHIELD | Attending: Internal Medicine | Primary: Internal Medicine

## 2024-07-21 NOTE — Progress Notes (Unsigned)
No answer phone just rang and rang 

## 2024-07-22 MED FILL — MOUNJARO 5 MG/0.5 ML SUBCUTANEOUS PEN INJECTOR: 5 5 mg/0.5 mL | SUBCUTANEOUS | 28 days supply | Qty: 2 | Fill #1 | Status: CP

## 2024-07-25 NOTE — Anesthesia Pre-Procedure Evaluation (Addendum)
 Patient: [name]    Procedure Information       Date/Time: 07/28/24 0930    Procedures:       Left endo CTS, left volar ganglion excision (Left: Wrist)      Excision, Ganglion Cyst (Left: Wrist)    Location: [facility] OR [facility] / [facility]    Surgeons: Randine Dills, MD            48 y.o female with PMH of HTN and T2DM presenting for left volar ganglion excision.     Stress echo 02/2017- negative for ischemia.       Diagnosis  No diagnosis found.  Allergies  Allergies[1]  Past Medical History  Medical History[2]  Problem List  Problem List[3]  Past Surgical History  Surgical History[4]  Social History  Social History[5]    Medications:  Prior to Admission  Prescriptions Prior to Admission[6]  Scheduled  Scheduled Medications[7]  Continuous  Continuous Medications[8]  PRN  PRN Medications[9]    Most Recent Labs  Sodium   Date/Time Value Ref Range Status   01/24/2024 09:54 AM 142 134 - 144 mmol/L Final   02/17/2021 09:20 AM 140 135 - 146 mmol/L Final     Potassium   Date/Time Value Ref Range Status   01/24/2024 09:54 AM 3.9 3.5 - 5.2 mmol/L Final   02/17/2021 09:20 AM 3.4 (L) 3.6 - 5.2 mmol/L Final     Chloride   Date/Time Value Ref Range Status   01/24/2024 09:54 AM 103 96 - 106 mmol/L Final   02/17/2021 09:20 AM 104 98 - 110 mmol/L Final     CO2 (Bicarbonate)   Date/Time Value Ref Range Status   02/17/2021 09:20 AM 26 20 - 32 mmol/L Final     Carbon Dioxide   Date/Time Value Ref Range Status   01/24/2024 09:54 AM 23 20 - 29 mmol/L Final     BUN   Date/Time Value Ref Range Status   01/24/2024 09:54 AM 15 6 - 24 mg/dL Final   95/85/7977 90:79 AM 17 6 - 24 mg/dL Final     Creatinine   Date/Time Value Ref Range Status   02/17/2021 09:20 AM 0.98 0.55 - 1.30 mg/dL Final     Creat   Date/Time Value Ref Range Status   01/24/2024 09:54 AM 0.92 0.57 - 1.00 mg/dL Final     Glucose   Date/Time Value Ref Range Status   01/24/2024 09:54 AM 81 70 - 99 mg/dL Final   95/85/7977 90:79 AM 142 (H) 70 - 139 mg/dL Final     WBC    Date/Time Value Ref Range Status   01/24/2024 09:54 AM 6.0 3.4 - 10.8 x10E3/uL Final   02/17/2021 09:20 AM 6.7 4.0 - 11.0 K/uL Final     Hemoglobin   Date/Time Value Ref Range Status   02/17/2021 09:20 AM 11.6 11.0 - 16.0 g/dL Final     Hgb   Date/Time Value Ref Range Status   01/24/2024 09:54 AM 12.0 11.1 - 15.9 g/dL Final     Hematocrit   Date/Time Value Ref Range Status   02/17/2021 09:20 AM 35.2 32.0 - 47.0 % Final     Hct   Date/Time Value Ref Range Status   01/24/2024 09:54 AM 36.7 34.0 - 46.6 % Final     Platelets   Date/Time Value Ref Range Status   01/24/2024 09:54 AM 276 150 - 450 x10E3/uL Final   02/17/2021 09:20 AM 323 150 - 400 K/uL Final  Pregnancy Status  No results found for: PREGTESTUR, PREGSERUM, HCG, HCGQUANT, UHCGQL    Cardiac  No results found for this or any previous visit (from the past 4464 hours).    No echocardiogram results found for the past 12 months    Relevant Problems   Cardio   (+) High cholesterol   (+) Hypertension      Neuro/Psych   (+) Depression, major, in remission   (+) Marijuana smoker      Other   (+) Bilateral carpal tunnel syndrome   (+) Horner's syndrome       Clinical information reviewed:                    Physical Exam    Anesthesia Plan    ASA 2     MAC     Airway: natural airway  Monitoring: standard monitors  Postoperative Pain Control: IV/PO analgesics    Multimodal PONV prophylaxis planned  Essential imaging and labs available and reviewed               Additional Equipment Requests           [1]   Allergies  Allergen Reactions    Penicillin V Potassium Rash     Other reaction(s): rash as child     [2]   Past Medical History:  Diagnosis Date    Diabetes mellitus (Multi-HCC)     Ganglion cyst of volar aspect of left wrist     Hypertension     [3]   Patient Active Problem List  Diagnosis    Unspecified abnormal cytological findings in specimens from cervix uteri    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  [5]   Social History  Tobacco Use    Smoking status: Never   Vaping Use    Vaping status: Never Used   Substance Use Topics    Drug use: Yes     Types: Marijuana     Comment: every other day   [6]   No medications prior to admission.   [7] [8] [9]

## 2024-07-25 NOTE — Telephone Encounter (Signed)
 Goodmorning       This patient would like to reschedule their surgery that is suppose to be on 07/28/24 as patient copay is to high for the surgery.      Best call back number# [phone]      Thank you

## 2024-08-11 ENCOUNTER — Ambulatory Visit: Payer: BLUE CROSS/BLUE SHIELD | Attending: Hand Surgery | Primary: Internal Medicine

## 2024-08-12 ENCOUNTER — Ambulatory Visit
Admit: 2024-08-12 | Discharge: 2024-08-12 | Payer: BLUE CROSS/BLUE SHIELD | Attending: Internal Medicine | Primary: Internal Medicine

## 2024-08-12 DIAGNOSIS — Z0000 Encounter for general adult medical examination without abnormal findings: Secondary | ICD-10-CM

## 2024-08-12 MED ORDER — insulin glargine (Lantus Solostar U-100 Insulin) 100 unit/mL (3 mL) injection
100 | Freq: Every morning | SUBCUTANEOUS | 3 refills | 60.00000 days | Status: AC
Start: 2024-08-12 — End: ?

## 2024-08-12 NOTE — Progress Notes (Signed)
 [facility] [place]   [address]  [address]  [place] [address]  [phone]     Patient ID:  [name] is a 48 y.o. female who presents for annual exam     Patient Health Questionnaire-9 Score: 0   Interpretation: Negative screening.     Follow-up & Interventions:   - Maintain Current Treatment Plan for Depression.                  Subjective    HPI   History of Present Illness    Mounjaro -was having stomach cramping on 5 mg a few weeks ago. Is hesitant to increase the dose at all   Sometimes not taking insulin       Will take about 15 units every day  Last 3 days 100% in range, no lows  Prior to that-GMI 5.9, 1% lows 10 pm to midnight (small dinner), no very lows, no highs on CGM       Every now and again gets headaches, not sure if low blood sugar. Occ tension headache.   Every now and again nausea.   No vomiting.  Never moved bowels every day. Bowels are ok.       Lantus   Lab Results   Component Value Date    HGBA1C 7.1 (H) 05/01/2024         Wt Readings from Last 9 Encounters:   08/12/24 87.4 kg   06/10/24 86.2 kg   05/01/24 87.1 kg   01/24/24 86.4 kg   07/10/23 89.8 kg   03/29/23 91.7 kg   01/16/23 96 kg   06/13/22 94.8 kg   05/16/22 97.5 kg   {  Major Depression in remission -stable on zoloft , in remission     High chol   Lab Results   Component Value Date    LDLCALC 45 01/24/2024       HTN  No dizzyness, doesn't check at home       Problem List[1]   Surgical History[2]     Family History:  family history includes Cataracts in her mother; Diabetes in her mother.     Social History[3]  Allergies[4]  Current Outpatient Medications   Medication Instructions    atorvastatin  (LIPITOR) 40 mg, oral, Daily    cholecalciferol  (VITAMIN D -3) 25 mcg, oral, Daily    Dexcom G7 Receiver misc Use to check glucose with Dexcom G7 sensors.    Dexcom G7 Sensor device Use to check blood sugar as directed. Change sensor every 10 days.    hydroCHLOROthiazide  (HYDRODIURIL ) 25 mg, oral, Daily     hydrocortisone  2.5 % ointment Apply topically twice daily to affected areas, stop when clear. Use for one week then alternate with protopic  for one week.    Lantus  Solostar U-100 Insulin  12 Units, subcutaneous, Every morning    Mounjaro  5 mg, subcutaneous, Every 7 days    pen needle, diabetic (BD Nano 2nd Gen Pen Needle) 32 gauge x 5/32 needle Use daily to inject insulin     sertraline  (ZOLOFT ) 50 mg, oral, Daily    tacrolimus  (Protopic ) 0.1 % ointment Apply topically twice daily to affected areas, stop when clear. Alternate with hydrocortisone . Safe to use daily.        Objective    Visit Vitals  BP 115/72   Pulse 80   Temp 36.9 C (98.4 F) (Temporal)   Ht 1.651 m   Wt 87.4 kg   SpO2 97%   BMI 32.05 kg/m  OB Status Postmenopausal   BSA 2 m     Wt Readings from Last 5 Encounters:   08/12/24 87.4 kg   06/10/24 86.2 kg   05/01/24 87.1 kg   01/24/24 86.4 kg   07/10/23 89.8 kg   {    Physical Exam   Physical Exam     Constitutional:       General: No acute distress      Appearance: Normal appearance.   HENT:      Head: Normocephalic and atraumatic.      Mouth: Mucous membranes are moist.      Pharynx: Oropharynx is clear.   Eyes:      Extraocular Movements: Extraocular movements intact.      Conjunctiva/sclera: Conjunctivae normal.      Pupils: Pupils are equal, round, and reactive to light.   Neck:      Vascular: No carotid bruit.   Cardiovascular:      Rate and Rhythm: Normal rate and regular rhythm.      Pulses: Normal pulses.      Heart sounds: Normal heart sounds.   Pulmonary:      Effort: Pulmonary effort is normal. No respiratory distress.      Breath sounds: Normal breath sounds.    Abdominal:      General: Bowel sounds are normal. There is no distension.      Palpations: Abdomen is soft. There is no mass.      Tenderness: There is no abdominal tenderness. There is no guarding or rebound.   Musculoskeletal:         General: No swelling, tenderness or deformity. Normal range of motion.      Cervical back: No  rigidity or tenderness.   Lymphadenopathy:      Cervical: No cervical adenopathy.   Skin:     General: Skin is warm and dry.      Findings: No lesion.   Neurological:      left Horners syndrome (miosis, lid droop)        Psychiatric:         Mood and Affect: Mood normal.         Behavior: Behavior normal.         Thought Content: Thought content normal.         Judgment: Judgment normal.      Breast exam-declined   Foot exam-normal pulses  Normal sensation   On bottom of big toe and base of 5th metatarsal there are callouses  Base of 5th metatarsal--not open but does look thinned/at risk for ulcer on right foot    Left foot has these as well but less so (base of big toe and base of 5th metatarsal)  Wearing crocs today       Lab Results   Component Value Date    WBC 6.0 01/24/2024    HGB 12.0 01/24/2024    HCT 36.7 01/24/2024    PLT 276 01/24/2024    CHOL 101 01/24/2024    TRIG 49 01/24/2024    HDL 44 01/24/2024    LDLCALC 45 01/24/2024    ALT 20 01/24/2024    AST 23 01/24/2024    NA 142 01/24/2024    K 3.9 01/24/2024    CL 103 01/24/2024    CREATININE 0.92 01/24/2024    BUN 15 01/24/2024    EGFR 77 01/24/2024    CO2 23 01/24/2024    TSH 1.100 01/16/2023    HGBA1C 7.1 (H) 05/01/2024  MICROALBUR <3.0 01/24/2024      Results         Assessment/Plan      [name] was seen today for follow-up.  Annual physical exam  Type 2 diabetes mellitus with mild nonproliferative retinopathy without macular edema, with long-term current use of insulin , unspecified laterality  -     insulin  glargine (Lantus  Solostar U-100 Insulin ) 100 unit/mL (3 mL) injection; Inject 12 Units under the skin in the morning.  Primary hypertension    Assessment & Plan       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, with long-term current use of insulin  (CMS/HCC)    improved, now controlled on mounjaro  5 mg     12/24 mild NPDR ou ; f/u 1 year booked Jan 2026   Lantus  15  mg//mounjaro  5.0 mg a1c is 7.1 and GMI today 5.9--will peel back to 12 Units/use CGM and call with updates     Discussed increasing mounjaro /decreasing lantus  but she would like to hold here for now     Calluses on feet, at risk for diabetic ulcer  Advised good shoes, wicking socks  She has podiatry this month  Advised to monitor and call with any changes         NOT  on ace, neg microalb ;   on statin at goal                Lab Results   Component Value Date     MICROALBCREA <2 01/24/2024     HGBA1C 7.1 (H) 05/01/2024     LDLCALC 45 01/24/2024             Low Vit D , switch to maintenance replacement     Marijuana smoker  Assessment & Plan:  Advised d/c, current daily use, precontemplative      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 1 year cotest f/u advised --done with recent colpo (around 04/2022//will need results) told 1 year f/u cotest advised ; Pap at [facility] 5/24 with f/u 11/2023 colpo--I don't have these results, she will get for me ; needs f/u pap Jan 2026          mild  major depression/mild anxietYnow in remission /lack of motivation  Ambivalent about therapy, discussed synchronous she will consider improved on zoloft   50- mg          eczema -f/b derm      Ganglion cyst-seen  hand , scheduled for March     Allergy penicillin -defers allergy appt      Last CE: 5/15; 1/17; 4/18 ; 12/19; 9/21 ; 12/22; 9/24 ; 10/25      Pap: /10 ascus; colpo neg; 12/11 colpo lsil; 6/12 ascus, hpv not done; 4/13 pap neg, hr hpv pos, colpo neg; 5/14 pap/hpv neg;  pap May 2015-neg pap, 2/22 LSIL , hpv pos, colpo benign; 1 year  cotest advised 2023 pap abnormal  colpo 2023/recent, negative per pt; 1 year cotest 5 /24 can't see result, also had 11/2023 colpo/// they told her 1 year follow up due 11/2024     Mammo: 06/2022 ; scheduled December   Colo:order cologuard4/25 negative  BMD:ni  Eye care 12/24 with mild DR , 1 year f/u ; booked Jan 8th   Dental: utd  Choleseterol   Lab Results   Component Value Date    LDLCALC 45 01/24/2024             Risk Score Cholesterol: on high intensity statin  Tdap: 2012,2018  MMR: vax 1978, 1994  Varicella:kid  Hep B:3 vax  Pvax:2011  Flu getting 2025 at work   Zoster:ni  HPV:ni  covid vax x 3; 24/25 booster /scheduled for tomorrow                   Follow up in about 3 months (around 11/12/2024).         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

## 2024-08-26 ENCOUNTER — Ambulatory Visit: Payer: BLUE CROSS/BLUE SHIELD | Attending: Surgery | Primary: Internal Medicine

## 2024-08-26 NOTE — Progress Notes (Deleted)
 [facility]  [address]  [place] [address]  [place] [address]  Dept: [phone]     Patient ID: [name] is a 48 y.o. female who presents for diabetic foot care. Ref by PCP. Hx of T2DM. 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    Dexcom G7 Sensor device Use to check blood sugar as directed. Change sensor every 10 days.    hydroCHLOROthiazide  (HYDRODIURIL ) 25 mg, oral, Daily    hydrocortisone  2.5 % ointment Apply topically twice daily to affected areas, stop when clear. Use for one week then alternate with protopic  for one week.    Lantus  Solostar U-100 Insulin  12 Units, subcutaneous, Every morning    Mounjaro  5 mg, subcutaneous, Every 7 days    pen needle, diabetic (BD Nano 2nd Gen Pen Needle) 32 gauge x 5/32 needle Use daily to inject insulin     sertraline  (ZOLOFT ) 50 mg, oral, Daily    tacrolimus  (Protopic ) 0.1 % ointment Apply topically twice daily to affected areas, stop when clear. Alternate with hydrocortisone . Safe to use daily.     Allergies[1]  Medical History[2]  Surgical History[3]  Family History[4]  Social History[5]    Objective  Visit Vitals  OB Status Postmenopausal     Physical Exam    Assessment / Plan  There are no diagnoses linked to this encounter.                              [1]   Allergies  Allergen Reactions    Penicillin V Potassium Rash     Other reaction(s): rash as  child     [2]   Past Medical History:  Diagnosis Date    Diabetes mellitus     Ganglion cyst of volar aspect of left wrist     Hypertension    [3]   Past Surgical History:  Procedure Laterality Date    CHOLECYSTECTOMY     [4]   Family History  Problem Relation Name Age of Onset    Diabetes Mother      Cataracts Mother     [5]   Social History  Socioeconomic History    Marital status: Single     Spouse name: Not on file    Number of children: Not on file    Years of education: Not on file    Highest education level: Not on file   Occupational History    Not on file   Tobacco Use    Smoking status: Never    Smokeless tobacco: Not on file   Vaping Use    Vaping status: Never Used   Substance and Sexual Activity    Alcohol use: Not on file    Drug use: Yes  Types: Marijuana     Comment: every other day    Sexual activity: Not Currently   Other Topics Concern    Not on file   Social History Narrative    Fam hx    Mom-dm, spinal stenosis    Dad-DM, CHF, dialysis     [name] ok    Brother-IGT    GM pancreatic cancer                 Lives with 49 year old [name], in [place]     Father of STarr involved but not boyfriend     No IPV    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    Health Care Proxy: Mom    Tobacco:none    Alcohol: 3/week; no alcohol now     Drugs:MJ, 4 days/week; 2 joints QOD (5/week)         Diet:diabetic ; going out at work/eating what isn't supposed to ; eats 2 meals/day    Exercise: walking     Car safety-seatbelts/texting:discussed        Partner: FOB ; using condoms 90%//currently not SA    DV:no    STI history:    gc, chlamydia, hpv, herpes                      HIV testing: 2021 neg     Contraception: currently not SA, Mirena out  now     Abnormal pap history: 9/10 ascus; colpo neg; 12/11 colpo lsil; 6/12 ascus, hpv not done; 4/13 pap neg, hr hpv pos, colpo neg; 5/14 pap/hpv neg;  pap May 2015-neg pap, did not receive hpv results; 2/22 LSIL , hpv pos, colpo benign; 1 year cotest advised          Social Determinants of Health     Financial Resource Strain: Low Risk  (06/02/2024)    Overall Financial Resource Strain (CARDIA)     Difficulty of Paying Living Expenses: Not hard at all   Food Insecurity: Unknown (06/02/2024)    Hunger Vital Sign     Worried About Running Out of Food in the Last Year: Never true     Ran Out of Food in the Last Year: Not on file   Transportation Needs: No Transportation Needs (06/02/2024)    PRAPARE - Therapist, art (Medical): No     Lack of Transportation (Non-Medical): No   Physical Activity: Not on file   Stress: Not on file   Social Connections: Not on file   Intimate Partner Violence: Not on file   Housing Stability: Unknown (06/02/2024)    Housing Stability Vital Sign     Unable to Pay for Housing in the Last Year: Not on file     Number of Times Moved in the Last Year: Not on file     Homeless in the Last Year: No

## 2024-09-25 MED FILL — MOUNJARO 5 MG/0.5 ML SUBCUTANEOUS PEN INJECTOR: 5 5 mg/0.5 mL | SUBCUTANEOUS | 28 days supply | Qty: 2 | Fill #2 | Status: CP

## 2024-10-23 ENCOUNTER — Ambulatory Visit: Payer: BLUE CROSS/BLUE SHIELD | Attending: Ophthalmology | Primary: Internal Medicine

## 2024-10-31 ENCOUNTER — Ambulatory Visit: Payer: BLUE CROSS/BLUE SHIELD | Primary: Internal Medicine

## 2024-11-13 ENCOUNTER — Ambulatory Visit: Payer: BLUE CROSS/BLUE SHIELD | Attending: Ophthalmology | Primary: Internal Medicine

## 2024-11-27 ENCOUNTER — Ambulatory Visit: Payer: BLUE CROSS/BLUE SHIELD | Attending: Internal Medicine | Primary: Internal Medicine

## 2024-12-02 ENCOUNTER — Ambulatory Visit: Payer: BLUE CROSS/BLUE SHIELD | Attending: Internal Medicine | Primary: Internal Medicine
